# Patient Record
Sex: Female | Born: 1980 | Race: White | Hispanic: No | Marital: Married | Smoking: Never smoker
Health system: Southern US, Community
[De-identification: ages and names within clinical notes are randomized; demographics above are authoritative.]

## PROBLEM LIST (undated history)

## (undated) DIAGNOSIS — M539 Dorsopathy, unspecified: Secondary | ICD-10-CM

## (undated) DIAGNOSIS — L309 Dermatitis, unspecified: Secondary | ICD-10-CM

## (undated) DIAGNOSIS — E039 Hypothyroidism, unspecified: Secondary | ICD-10-CM

## (undated) DIAGNOSIS — I10 Essential (primary) hypertension: Secondary | ICD-10-CM

## (undated) HISTORY — DX: Dorsopathy, unspecified: M53.9

## (undated) HISTORY — DX: Dermatitis, unspecified: L30.9

## (undated) HISTORY — DX: Essential (primary) hypertension: I10

## (undated) HISTORY — DX: Hypothyroidism, unspecified: E03.9

## (undated) HISTORY — PX: HAND SURGERY: SHX662

## (undated) HISTORY — PX: INTRAUTERINE DEVICE INSERTION: SHX323

---

## 2001-09-02 ENCOUNTER — Emergency Department (HOSPITAL_COMMUNITY): Admission: EM | Admit: 2001-09-02 | Discharge: 2001-09-02 | Payer: Self-pay | Admitting: Emergency Medicine

## 2004-02-20 ENCOUNTER — Inpatient Hospital Stay (HOSPITAL_COMMUNITY): Admission: AD | Admit: 2004-02-20 | Discharge: 2004-02-20 | Payer: Self-pay | Admitting: *Deleted

## 2004-04-02 ENCOUNTER — Encounter (INDEPENDENT_AMBULATORY_CARE_PROVIDER_SITE_OTHER): Payer: Self-pay | Admitting: Specialist

## 2004-04-02 ENCOUNTER — Inpatient Hospital Stay (HOSPITAL_COMMUNITY): Admission: AD | Admit: 2004-04-02 | Discharge: 2004-04-04 | Payer: Self-pay | Admitting: Obstetrics & Gynecology

## 2005-03-09 ENCOUNTER — Emergency Department (HOSPITAL_COMMUNITY): Admission: EM | Admit: 2005-03-09 | Discharge: 2005-03-09 | Payer: Self-pay | Admitting: Family Medicine

## 2005-12-05 ENCOUNTER — Encounter: Payer: Self-pay | Admitting: Family Medicine

## 2006-04-05 ENCOUNTER — Encounter: Payer: Self-pay | Admitting: Family Medicine

## 2006-10-16 ENCOUNTER — Ambulatory Visit: Payer: Self-pay | Admitting: Family Medicine

## 2006-10-16 DIAGNOSIS — R599 Enlarged lymph nodes, unspecified: Secondary | ICD-10-CM | POA: Insufficient documentation

## 2006-10-16 DIAGNOSIS — N951 Menopausal and female climacteric states: Secondary | ICD-10-CM | POA: Insufficient documentation

## 2006-10-17 ENCOUNTER — Encounter: Payer: Self-pay | Admitting: Family Medicine

## 2006-10-17 LAB — CONVERTED CEMR LAB
Basophils Absolute: 0 10*3/uL (ref 0.0–0.1)
Basophils Relative: 0 % (ref 0–1)
Eosinophils Absolute: 0.1 10*3/uL (ref 0.0–0.7)
Eosinophils Relative: 1 % (ref 0–5)
Free T4: 1.14 ng/dL (ref 0.89–1.80)
HCT: 43 % (ref 36.0–46.0)
Hemoglobin: 14.1 g/dL (ref 12.0–15.0)
Lymphocytes Relative: 22 % (ref 12–46)
Lymphs Abs: 2.3 10*3/uL (ref 0.7–3.3)
MCHC: 32.8 g/dL (ref 30.0–36.0)
MCV: 91.1 fL (ref 78.0–100.0)
Monocytes Absolute: 0.6 10*3/uL (ref 0.2–0.7)
Monocytes Relative: 6 % (ref 3–11)
Neutro Abs: 7.4 10*3/uL (ref 1.7–7.7)
Neutrophils Relative %: 71 % (ref 43–77)
Platelets: 219 10*3/uL (ref 150–400)
Progesterone: 3.5 ng/mL
RBC: 4.72 M/uL (ref 3.87–5.11)
RDW: 13.9 % (ref 11.5–14.0)
TSH: 0.729 microintl units/mL (ref 0.350–5.50)
WBC: 10.4 10*3/uL (ref 4.0–10.5)

## 2006-10-18 ENCOUNTER — Encounter: Payer: Self-pay | Admitting: Family Medicine

## 2006-10-22 ENCOUNTER — Telehealth: Payer: Self-pay | Admitting: Family Medicine

## 2006-10-22 DIAGNOSIS — F411 Generalized anxiety disorder: Secondary | ICD-10-CM | POA: Insufficient documentation

## 2006-10-31 ENCOUNTER — Telehealth (INDEPENDENT_AMBULATORY_CARE_PROVIDER_SITE_OTHER): Payer: Self-pay | Admitting: *Deleted

## 2006-11-05 ENCOUNTER — Ambulatory Visit (HOSPITAL_COMMUNITY): Payer: Self-pay | Admitting: Psychiatry

## 2006-11-12 ENCOUNTER — Ambulatory Visit (HOSPITAL_COMMUNITY): Payer: Self-pay | Admitting: Psychiatry

## 2007-01-23 ENCOUNTER — Encounter: Admission: RE | Admit: 2007-01-23 | Discharge: 2007-01-23 | Payer: Self-pay | Admitting: Family Medicine

## 2007-03-24 ENCOUNTER — Ambulatory Visit: Payer: Self-pay | Admitting: Family Medicine

## 2007-03-27 ENCOUNTER — Telehealth: Payer: Self-pay | Admitting: Family Medicine

## 2007-05-13 ENCOUNTER — Telehealth: Payer: Self-pay | Admitting: Family Medicine

## 2007-09-04 ENCOUNTER — Ambulatory Visit: Payer: Self-pay | Admitting: Family Medicine

## 2007-09-04 DIAGNOSIS — R21 Rash and other nonspecific skin eruption: Secondary | ICD-10-CM | POA: Insufficient documentation

## 2007-09-04 DIAGNOSIS — B351 Tinea unguium: Secondary | ICD-10-CM | POA: Insufficient documentation

## 2007-09-05 ENCOUNTER — Encounter: Payer: Self-pay | Admitting: Family Medicine

## 2007-09-12 ENCOUNTER — Ambulatory Visit: Payer: Self-pay | Admitting: Family Medicine

## 2007-09-16 ENCOUNTER — Telehealth: Payer: Self-pay | Admitting: Family Medicine

## 2007-10-07 ENCOUNTER — Telehealth: Payer: Self-pay | Admitting: Family Medicine

## 2007-11-26 ENCOUNTER — Ambulatory Visit: Payer: Self-pay | Admitting: Family Medicine

## 2007-11-26 DIAGNOSIS — J3089 Other allergic rhinitis: Secondary | ICD-10-CM

## 2007-11-26 DIAGNOSIS — J309 Allergic rhinitis, unspecified: Secondary | ICD-10-CM | POA: Insufficient documentation

## 2008-05-17 ENCOUNTER — Encounter: Admission: RE | Admit: 2008-05-17 | Discharge: 2008-05-17 | Payer: Self-pay | Admitting: Obstetrics and Gynecology

## 2008-09-22 ENCOUNTER — Ambulatory Visit: Payer: Self-pay | Admitting: Family Medicine

## 2008-11-11 ENCOUNTER — Telehealth: Payer: Self-pay | Admitting: Family Medicine

## 2009-01-19 ENCOUNTER — Ambulatory Visit: Payer: Self-pay | Admitting: Family Medicine

## 2009-01-19 DIAGNOSIS — N6009 Solitary cyst of unspecified breast: Secondary | ICD-10-CM | POA: Insufficient documentation

## 2009-04-13 ENCOUNTER — Ambulatory Visit: Payer: Self-pay | Admitting: Family Medicine

## 2009-04-18 ENCOUNTER — Encounter: Payer: Self-pay | Admitting: Family Medicine

## 2009-04-19 LAB — CONVERTED CEMR LAB
ALT: 11 units/L (ref 0–35)
AST: 15 units/L (ref 0–37)
Albumin: 4.6 g/dL (ref 3.5–5.2)
Alkaline Phosphatase: 65 units/L (ref 39–117)
BUN: 16 mg/dL (ref 6–23)
CO2: 23 meq/L (ref 19–32)
Calcium: 8.7 mg/dL (ref 8.4–10.5)
Chloride: 106 meq/L (ref 96–112)
Cholesterol: 139 mg/dL (ref 0–200)
Creatinine, Ser: 0.77 mg/dL (ref 0.40–1.20)
Glucose, Bld: 74 mg/dL (ref 70–99)
HDL: 48 mg/dL (ref >39)
LDL Cholesterol: 79 mg/dL (ref 0–99)
Potassium: 4 meq/L (ref 3.5–5.3)
Sodium: 139 meq/L (ref 135–145)
TSH: 2.64 microintl units/mL (ref 0.350–4.500)
Total Bilirubin: 0.5 mg/dL (ref 0.3–1.2)
Total CHOL/HDL Ratio: 2.9
Total Protein: 7.4 g/dL (ref 6.0–8.3)
Triglycerides: 60 mg/dL (ref <150)
VLDL: 12 mg/dL (ref 0–40)

## 2009-09-07 ENCOUNTER — Encounter: Payer: Self-pay | Admitting: Family Medicine

## 2009-09-07 DIAGNOSIS — L259 Unspecified contact dermatitis, unspecified cause: Secondary | ICD-10-CM | POA: Insufficient documentation

## 2009-09-30 ENCOUNTER — Encounter: Admission: RE | Admit: 2009-09-30 | Discharge: 2009-09-30 | Payer: Self-pay | Admitting: Obstetrics and Gynecology

## 2009-10-11 ENCOUNTER — Encounter: Admission: RE | Admit: 2009-10-11 | Discharge: 2009-10-11 | Payer: Self-pay | Admitting: Obstetrics and Gynecology

## 2009-12-03 ENCOUNTER — Encounter: Payer: Self-pay | Admitting: Family Medicine

## 2009-12-03 DIAGNOSIS — H6500 Acute serous otitis media, unspecified ear: Secondary | ICD-10-CM | POA: Insufficient documentation

## 2009-12-27 ENCOUNTER — Ambulatory Visit: Admit: 2009-12-27 | Discharge: 2009-12-27 | Payer: Self-pay | Admitting: Emergency Medicine

## 2010-01-31 NOTE — Assessment & Plan Note (Signed)
Summary: URI   Vital Signs:  Patient Profile:   30 Years Old Female Height:     64.75 inches Weight:      157 pounds O2 Sat:      99 % Temp:     98.2 degrees F oral Pulse rate:   90 / minute BP sitting:   125 / 72  (left arm) Cuff size:   regular  Vitals Entered By: Harlene Salts (September 12, 2007 10:30 AM)                 PCP:  [name] DO  Chief Complaint:  throat sore, ear pressure, headache, nose stopped up, and fatigue.  History of Present Illness: Yesterday AM with mild ST, sinus congestion, bilateral ear pressure. No fever.  Some nausea.  No diarrhea or vomiting. Her back hurts. Feel very fatigued.  HA.  Took sinus medication.  - no help. No alleviateing sxs.  Didn't sleep well at all.      Current Allergies: No known allergies       Physical Exam  General:     Well-developed,well-nourished,in no acute distress; alert,appropriate and cooperative throughout examination Head:     Normocephalic and atraumatic without obvious abnormalities. No apparent alopecia or balding. Eyes:     No corneal or conjunctival inflammation noted. EOMI. Perrla. Ears:     External ear exam shows no significant lesions or deformities.  Otoscopic examination reveals clear canals, tympanic membranes are intact bilaterally without bulging, retraction, inflammation or discharge. Hearing is grossly normal bilaterally. Nose:     External nasal examination shows no deformity or inflammation. Nasal mucosa are pink and moist without lesions or exudates. Mouth:     Oral mucosa and oropharynx without lesions or exudates.  Teeth in good repair. Neck:     No deformities, masses, or tenderness noted. Lungs:     Normal respiratory effort, chest expands symmetrically. Lungs are clear to auscultation, no crackles or wheezes. Heart:     Normal rate and regular rhythm. S1 and S2 normal without gallop, murmur, click, rub or other extra sounds. Skin:     no rashes.   Cervical Nodes:  No lymphadenopathy noted Psych:     Cognition and judgment appear intact. Alert and cooperative with normal attention span and concentration. No apparent delusions, illusions, hallucinations    Impression & Recommendations:  Problem # 1:  URI (ICD-465.9)  Her updated medication list for this problem includes:    Tessalon Perles 100 Mg Caps (Benzonatate) .Marland Kitchen... 1 tab by mouth three times a day as needed cough    Hycodan 5-1.5 Mg/50ml Syrp (Hydrocodone-homatropine) .Marland Kitchen... 1 tsp by mouth at bedtime as needed cough    Fexofenadine Hcl 180 Mg Tabs (Fexofenadine hcl) .Marland Kitchen... 1 tab by mouth daily as needed for allergies Instructed on symptomatic treatment. Call if symptoms persist or worsen.   Complete Medication List: 1)  Mirena 20 Mcg/24hr Iud (Levonorgestrel) .... Use as directed 2)  Klonopin 0.5 Mg Tabs (Clonazepam) .Marland Kitchen.. 1 tab by mouth bid as needed anxiety 3)  Tessalon Perles 100 Mg Caps (Benzonatate) .Marland Kitchen.. 1 tab by mouth three times a day as needed cough 4)  Hycodan 5-1.5 Mg/16ml Syrp (Hydrocodone-homatropine) .Marland Kitchen.. 1 tsp by mouth at bedtime as needed cough 5)  Fexofenadine Hcl 180 Mg Tabs (Fexofenadine hcl) .Marland Kitchen.. 1 tab by mouth daily as needed for allergies 6)  Penlac 8 % Soln (Ciclopirox) .... Apply to nail at bedtime. then removed once a week with alcohol. 7)  Fluoxetine  Hcl 10 Mg Tabs (Fluoxetine hcl) .... Take 1 tablet by mouth once a day    ]

## 2010-01-31 NOTE — Progress Notes (Signed)
Summary: call re: labs  Phone Note Outgoing Call Call back at Home Phone [phone]   Call placed by: [name] DO,  October 22, 2006 9:26 AM Summary of Call: called pt and LMOM re: normal labs.  Nothing to explain hot flashes and sweating.  Interested to hear if lowered dose of SSRI has made a difference.  If still having problems, will make sure HIV screen and PPD screen are UTD.   Initial call taken by: [name] DO,  October 22, 2006 9:27 AM  Follow-up for Phone Call        Isabelle Course, can you try her back today? Follow-up by: [name] DO,  October 23, 2006 1:19 PM  Additional Follow-up for Phone Call Additional follow up Details #1::        Spoke to REbeccca who notices that episodes are stress-related and feel anxious and accepts the marrital discord at home.  Agrees to trying counseling, stop Lexapro and will use Klonopin as needed. Additional Follow-up by: [name] DO,  October 24, 2006 10:18 AM  New Problems: ANXIETY STATE NOS (ICD-300.00)   New Problems: ANXIETY STATE NOS (ICD-300.00) New/Updated Medications: KLONOPIN 0.5 MG  TABS (CLONAZEPAM) 1 tab by mouth bid as needed anxiety patient states that lowered dose of SSRI has not made a difference.  she is not sure as to what you want her to do from here and wants to know if it could be her nerves.  patient said to call her at work [phone] her cell phone does not work.  Lucille Passy, October 22, 2006 3:20PM  Prescriptions: KLONOPIN 0.5 MG  TABS (CLONAZEPAM) 1 tab by mouth bid as needed anxiety  #40 x 0   Entered and Authorized by:   [name] DO   Signed by:   [name] DO on 10/24/2006   Method used:   Printed then faxed to ...       [facility]  [address] [place], [place]  [address]       Ph: [phone]       Fax: [phone]   RxID:   [id number]     Appended Document: call re: labs RX SENT TO [facility] WHICH IS WRONG PHARMACY AND TODAY CALLED TO CORRECT PHARMACY [facility]  [email or web address]

## 2010-01-31 NOTE — Progress Notes (Signed)
  Phone Note Call from Patient Call back at Work Phone [phone]   Caller: Patient Call For: [facility] Summary of Call: Pt calls and needs to know if she is still contagious- has to let her manager know downstairs Initial call taken by: Kathlene November,  September 16, 2007 10:09 AM  Follow-up for Phone Call        With all cold and viruses you are contagious.  Yes.  Follow-up by: Nani Gasser MD,  September 16, 2007 10:53 AM  Additional Follow-up for Phone Call Additional follow up Details #1::        Pt notified of MD instructions. KJ LPN Additional Follow-up by: Kathlene November,  September 16, 2007 11:11 AM

## 2010-01-31 NOTE — Progress Notes (Signed)
Summary: WANT PROZAC DOSE INCREASED  Phone Note Call from Patient Call back at Work Phone [phone]   Caller: Patient Call For: [name] DO Summary of Call: Patient called to say that prozac has helped but she thinks she need it to be increased some. Patient uses  [facility]. Initial call taken by: Harlene Salts,  October 07, 2007 9:03 AM  Follow-up for Phone Call        Pt to increase to Fluoxetine 20 mg once daily (2 - 10 mg tabs).  Have her make OV to see me back in 2 wks. Follow-up by: [name] DO,  October 07, 2007 9:22 AM  Additional Follow-up for Phone Call Additional follow up Details #1::        patient informed. Additional Follow-up by: Harlene Salts,  October 07, 2007 10:41 AM    New/Updated Medications: FLUOXETINE HCL 10 MG TABS (FLUOXETINE HCL) Take 2  tablet by mouth once a day   Prescriptions: FLUOXETINE HCL 10 MG TABS (FLUOXETINE HCL) Take 2  tablet by mouth once a day  #60 x 0   Entered by:   Harlene Salts   Authorized by:   [name] DO   Signed by:   Harlene Salts on 10/07/2007   Method used:   Electronically to        [facility]* (retail)       [address] [place], [place]  [address]       Ph: [phone]       Fax: [phone]   RxID:   [id number]

## 2010-01-31 NOTE — Assessment & Plan Note (Signed)
Summary: CPE w/o pap    Vital Signs:  Patient profile:   30 year old female Height:      64.75 inches Weight:      155 pounds BMI:     26.09 O2 Sat:      99 % on Room air Pulse rate:   86 / minute BP sitting:   145 / 85  (left arm) Cuff size:   regular  Vitals Entered By: Payton Spark CMA (April 13, 2009 4:09 PM)  O2 Flow:  Room air CC: CPE w/out pap   Primary Care Provider:  [name] DO  CC:  CPE w/out pap.  History of Present Illness: 30 yo WF presents for CPE w/o pap smear.  Seeing gyn for pap smears.  She is under a lot of stress going thru a divorce.  She just moved back into her old house and is dealing custoday and financial problems.    She is overall happy that she decided to separate.  Her BP is high today.  She is due for fasting labs.  She thinks that she had her tetanus < 10 yrs ago.  Her mom has Graves Dz.  She wants to have her TSH checked.    Denies fam hx of colon cancer, breast cancer or premature heart dz.      Current Medications (verified): 1)  Mirena 20 Mcg/24hr  Iud (Levonorgestrel) .... Use As Directed 2)  Klonopin 0.5 Mg  Tabs (Clonazepam) .Marland Kitchen.. 1 Tab By Mouth Bid As Needed Anxiety 3)  Claritin-D 24 Hour 10-240 Mg Xr24h-Tab (Loratadine-Pseudoephedrine) .Marland Kitchen.. 1 Tab By Mouth Daily 4)  Penlac 8 % Soln (Ciclopirox) .... Apply To Nail At Bedtime. Then Removed Once A Week With Alcohol. 5)  Zoloft 50 Mg Tabs (Sertraline Hcl) .Marland Kitchen.. 1 Tab By Mouth Daily  Allergies (verified): No Known Drug Allergies  Past History:  Past Medical History: Reviewed history from 10/16/2006 and no changes required. G1P1  Family History: mother Graves Dz  Social History: Reviewed history from 10/16/2006 and no changes required. 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 Occas ETOH, social smoker < 2/ wk No formal exercise.  Review of Systems       The patient complains of weight gain.  The patient denies anorexia, fever, weight  loss, vision loss, decreased hearing, hoarseness, chest pain, syncope, dyspnea on exertion, peripheral edema, prolonged cough, headaches, hemoptysis, abdominal pain, melena, hematochezia, severe indigestion/heartburn, hematuria, incontinence, genital sores, muscle weakness, suspicious skin lesions, transient blindness, difficulty walking, depression, unusual weight change, abnormal bleeding, enlarged lymph nodes, angioedema, breast masses, and testicular masses.    Physical Exam  General:  alert, well-developed, well-nourished, well-hydrated, and overweight-appearing.   Head:  normocephalic and atraumatic.   Eyes:  pupils equal, pupils round, and pupils reactive to light.  wears glasses Ears:  EACs patent; TMs translucent and gray with good cone of light and bony landmarks.  Nose:  no nasal discharge.   Mouth:  good dentition and pharynx pink and moist.   Neck:  no masses.   Lungs:  Normal respiratory effort, chest expands symmetrically. Lungs are clear to auscultation, no crackles or wheezes. Heart:  Normal rate and regular rhythm. S1 and S2 normal without gallop, murmur, click, rub or other extra sounds. Abdomen:  Bowel sounds positive,abdomen soft and non-tender without masses, organomegaly or hernias noted. Pulses:  2+ radial and pedal pulses Extremities:  no E/C/C Skin:  color normal.   Cervical Nodes:  No lymphadenopathy  noted Psych:  good eye contact, not anxious appearing, and not depressed appearing.     Impression & Recommendations:  Problem # 1:  HEALTH SCREENING (ICD-V70.0) Keeping healthy checklist for women reviewed. BP high today, under more stress.  REpeat at nurse visit in 1 wk. BMI 26 c/w overwt status. Pap smear per gyn. Update fasting labs, include TSH. Work on [employer], regular exercise, MVI and Vitamin D daily. RTC in 2-3 mos for f/u mood.  Complete Medication List: 1)  Mirena 20 Mcg/24hr Iud (Levonorgestrel) .... Use as directed 2)  Klonopin 0.5 Mg Tabs  (Clonazepam) .Marland Kitchen.. 1 tab by mouth bid as needed anxiety 3)  Claritin-d 24 Hour 10-240 Mg Xr24h-tab (Loratadine-pseudoephedrine) .Marland Kitchen.. 1 tab by mouth daily 4)  Penlac 8 % Soln (Ciclopirox) .... Apply to nail at bedtime. then removed once a week with alcohol. 5)  Zoloft 50 Mg Tabs (Sertraline hcl) .Marland Kitchen.. 1 tab by mouth daily  Other Orders: T-TSH ([id number]) T-Comprehensive Metabolic Panel [id number]) T-Lipid Profile [id number])  Patient Instructions: 1)  Stay on Zoloft + Klonopin for mood during stressful life events. 2)  F/U for mood in 2-3 mos. 3)  Update fasting labs. 4)  Will call you w/ results.

## 2010-01-31 NOTE — Assessment & Plan Note (Signed)
Summary: NOV hot flashes/ear pain   Vital Signs:  Patient Profile:   30 Years Old Female Height:     64.75 inches Weight:      147 pounds BMI:     24.74 Temp:     98.2 degrees F oral Pulse rate:   88 / minute BP sitting:   122 / 78  (left arm) Cuff size:   regular  Vitals Entered By: Harlene Salts (October 16, 2006 2:26 PM)                 Visit Type:  new PCP:  [name] DO  Chief Complaint:  NOV, CHECK KNOT AT EAR CAUSING PAIN, and WANT THROID CHECKED.  History of Present Illness: 30 yo G1P1 presents as a new office visit for problems with hot flashes, sweating, and moodiness.  This began before she had her first child 2 and a half years ago and seems to be getting a little worse.  She had her thryoid checked about 6 mso ago and told it was 'borderline'.  She is on Lexapro for postpartum moodiness which was also related to her husband's lack of help towards her.  She happily enjoys being a mom to her daughter Alicia Sanders'.  Dr Earlene Plater (OB-GYN) placed a Mirena IUD after the birth of her daughter.  The hot flashes and sweating occurs day and night.  Even with cutting back on caffeine or being in cold weather, this occurs.  It started before she went on Lexapro.  She denies having heart palpitations, diarrhea, constipation or problems iwth blood pressure.  Current Allergies: No known allergies   Past Medical History:    G1P1   Social History:    Works at [employer], Medical laboratory scientific officer.    Married with a young daughter , Alicia Sanders.    Occas ETOH, social smoker < 2/ wk    No formal exercise.   Risk Factors:  Caffeine use:  2 drinks per day  Family History Risk Factors:    Family History of MI in females < 55 years old:  no    Family History of MI in males < 5 years old:  no   Review of Systems       no fevers/+sweats/no weakness, unexplained wt loss/gain, no change in vision, no difficulty hearing, ringing in ears, no hay fever/allergies, no CP/discomfort, no palpitations,  no breast lump/nipple discharge, no cough/wheeze, no blood in stool, no N/V/D, no nocturia, no leaking urine, no unusual vag bleeding, no vaginal/penile discharge, no muscle/joint pain, no rash, no new/changing mole, no HA, no memory loss, no anxiety, no sleep problem, no depression, no unexplained lumps, no easy bruising/bleeding, no concern with sexual function, +painful bump in front of L ear x 2 days    Physical Exam  General:     alert, well-developed, well-nourished, and well-hydrated.   Head:     normocephalic and atraumatic.   Eyes:     pupils equal, pupils round, and pupils reactive to light.  conjunctiva clear Ears:     EACs patent; TMs translucent and gray with good cone of light and bony landmarks. slightly enlarged L preauricular node, tender to palpation  Nose:     no external deformity and no nasal discharge.   Mouth:     good dentition and pharynx pink and moist.  slightly tender but no clicking or dislocation at the TMJ, 2+ bilat tonsilar enlargement, cryptic with mucocele on the R one. Neck:     supple and no  masses.   Lungs:     normal respiratory effort, no accessory muscle use, normal breath sounds, and no wheezes.   Heart:     normal rate, regular rhythm, no murmur, and no rub.   Abdomen:     Bowel sounds positive,abdomen soft and non-tender without masses, organomegaly or hernias noted. Msk:     no joint effusions Pulses:     2+ radial pulses Extremities:     no E/C/C Neurologic:     gait normal.   Skin:     slightly flushed cheeks; skin warm and dry Cervical Nodes:     No lymphadenopathy noted Psych:     good eye contact, not anxious appearing, and not depressed appearing.      Impression & Recommendations:  Problem # 1:  HOT FLASHES (ICD-627.2) Hot Flashes, facial flushing and nightsweats > 2 yrs.  DDX includes SSRI side effect, thryoid d/o, fluctuating estrogen levels, anxiety, adrenal tumor.  Begin with lab w/u.  Cut back on Lexapro in order  to wean off.  See back in 3 wks to review and consider thryoid and renal u/s.   Orders: T-CBC w/Diff [id number] T-TSH [id number]) T-T4, Free [id number]) T-Progesterone ([id number]) T-Estradiol [id number])   Problem # 2:  SYMPTOM, ENLARGEMENT, LYMPH NODES (ICD-785.6) Slightly enlarged R preauricular lymph node, tender x 1-2 days.  No concurrent viral illness or allergies.  Also in this location which can cause referred ear pain is the TMJ.  Consider that this area is tender from TMJ pain syndrome, bruxism, or dental problem.  She can use Motrin and cool compresses for relief.  Call if anything changes.  Complete Medication List: 1)  Lexapro 5 Mg Tabs (Escitalopram oxalate) .Marland Kitchen.. 1 tab by mouth daily 2)  Mirena 20 Mcg/24hr Iud (Levonorgestrel) .... Use as directed   Patient Instructions: 1)  For preauricular node and ear pain: use cool compresses and ibuprofen 600 mg 3 x a day as needed.  Try an OTC bite guard. 2)  Have labs drawn.  I will call you w/ results on Friday. 3)  Start lower dose of Lexapro. 4)  Return for f/u in 3 wks.    Prescriptions: LEXAPRO 5 MG  TABS (ESCITALOPRAM OXALATE) 1 tab by mouth daily  #30 x 1   Entered and Authorized by:   [name] DO   Signed by:   [name] DO on 10/16/2006   Method used:   Print then Give to Patient   RxID:   [id number]  ]

## 2010-01-31 NOTE — Progress Notes (Signed)
----   Converted from flag ---- ---- 11/11/2008 10:22 AM, Marliss Coots wrote: Pt uses [facility] in [place]. Pt has app on 11-24-08. Thanks.Marland Kitchen ------------------------------  Prescriptions: KLONOPIN 0.5 MG  TABS (CLONAZEPAM) 1 tab by mouth bid as needed anxiety  #40 x 0   Entered and Authorized by:   Nani Gasser MD   Signed by:   Nani Gasser MD on 11/11/2008   Method used:   Printed then faxed to ...         RxID:   [id number]  OK to refill for one month

## 2010-01-31 NOTE — Progress Notes (Signed)
Summary: Feels worse  Phone Note Call from Patient Call back at Home Phone [phone]   Caller: Patient Call For: [place] Summary of Call: Pt calls and states that she is no better- actually worse. Wants top know if you would call in antibiotic and some cough syrup to [facility] in [place].States blowing green-yellow from nose and coughing up green. Initial call taken by: Kathlene November,  September 16, 2007 8:04 AM  Follow-up for Phone Call        Will treat since getting worse.  Follow-up by: Nani Gasser MD,  September 16, 2007 8:07 AM    New/Updated Medications: AMOXICILLIN 500 MG CAPS (AMOXICILLIN) Take 1 tablet by mouth three times a day for 10 days HYDROCODONE-HOMATROPINE 5-1.5 MG/5ML SYRP (HYDROCODONE-HOMATROPINE) 5ml by mouth at bedtime as needed cough   Prescriptions: HYDROCODONE-HOMATROPINE 5-1.5 MG/5ML SYRP (HYDROCODONE-HOMATROPINE) 5ml by mouth at bedtime as needed cough  #143ml x 0   Entered and Authorized by:   Nani Gasser MD   Signed by:   Nani Gasser MD on 09/16/2007   Method used:   Printed then faxed to ...       [facility]* (retail)       [address] [place], [place]  [address]       Ph: [phone]       Fax: [phone]   RxID:   [id number] AMOXICILLIN 500 MG CAPS (AMOXICILLIN) Take 1 tablet by mouth three times a day for 10 days  #30 x 0   Entered and Authorized by:   Nani Gasser MD   Signed by:   Nani Gasser MD on 09/16/2007   Method used:   Electronically to        [facility]* (retail)       [address] [place], [place]  [address]       Ph: [phone]       Fax: [phone]   RxID:   [id number]
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
## 2010-01-31 NOTE — Miscellaneous (Signed)
Summary: Immunizations  Immunizations   Imported By: Harlene Salts 11/01/2006 11:13:29  _____________________________________________________________________  External Attachment:    Type:   Image     Comment:   External Document

## 2010-01-31 NOTE — Miscellaneous (Signed)
Summary: Immunizations  Immunizations   Imported By: Harlene Salts 11/01/2006 11:12:57  _____________________________________________________________________  External Attachment:    Type:   Image     Comment:   External Document

## 2010-01-31 NOTE — Progress Notes (Signed)
Summary: WANT TO CHANGE ALLERGY MED  Phone Note Call from Patient Call back at Work Phone [phone]   Caller: Patient Call For: Alicia Bars DO Summary of Call: PATIENT HAS BEEN USING OTC CLARITAN AND IT'S NOT WORKING AND WANT TO KNOW IF MD WOULD PRESCRIBE ALLEGRA. PATIENT USES [facility]. Initial call taken by: Harlene Salts,  May 13, 2007 8:16 AM  Follow-up for Phone Call        sure. Follow-up by: Alicia Bars DO,  May 13, 2007 8:26 AM  Additional Follow-up for Phone Call Additional follow up Details #1::        PATIENT INFORMED. Additional Follow-up by: Harlene Salts,  May 13, 2007 8:38 AM    New/Updated Medications: FEXOFENADINE HCL 180 MG  TABS (FEXOFENADINE HCL) 1 tab by mouth daily as needed for allergies   Prescriptions: FEXOFENADINE HCL 180 MG  TABS (FEXOFENADINE HCL) 1 tab by mouth daily as needed for allergies  #30 x 2   Entered and Authorized by:   Alicia Bars DO   Signed by:   Alicia Bars DO on 05/13/2007   Method used:   Electronically sent to ...       [facility]       [address] [place], [place]  [address]       Ph: [phone]       Fax: [phone]   RxID:   [id number]

## 2010-01-31 NOTE — Assessment & Plan Note (Signed)
Summary: Rash  Subjective:  Patient complains of occasional appearance of discrete macular erythematous lesions on arms or upper chest that last a while then resolve.  Lesions do not itch.  No response to 1% HC cream.  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  She now has a recurrent lesion on right upper arm near axilla.  Objective:  Appearance:  Patient appears healthy, stated age, and in no acute distress  Skin:  right upper inferior arm near axilla:  1.5cm dia oval erythematous macular lesion with well demarcated edge.  No scaling or tenderness.  No swelling.  Assessment:  DERMATITIS (ICD-692.9) ? etiology.  Lesion appears non-specific  Plan:  ELOCON 0.1 % CREA (MOMETASONE FUROATE) Apply thin layer to affected area two times a day for 3 to 5 days. Follow-up with dermatologist if lesions begin appearing more frequently or if no response to Elocon

## 2010-01-31 NOTE — Assessment & Plan Note (Signed)
Summary: Allergic rhinitis/sinusitis   Vital Signs:  Patient profile:   30 year old female Height:      64.75 inches Weight:      161 pounds BMI:     27.10 Temp:     97.7 degrees F oral Pulse rate:   82 / minute BP sitting:   116 / 74  (left arm) Cuff size:   regular  Vitals Entered By: Kathlene November (September 22, 2008 3:41 PM) CC: drainage, pressure in ears, hot, fatigue. Symptoms since last Thursday   Primary Care Provider:  [name] DO  CC:  drainage, pressure in ears, hot, and fatigue. Symptoms since last Thursday.  History of Present Illness: drainage, pressure in ears, hot, fatigue. Symptoms since last Thursday.  Yellow sinus drainage. Bilat ear pain so severe it was waking her up.  Then hearing a whoosing.  Throat is irriated and sore for the last couple of days. Taking OTC severe head congestion med.  Itchy nose.  Taking allegra. No fever.  Does feel a little better today.  Allergies: No Known Drug Allergies  Physical Exam  General:  Well-developed,well-nourished,in no acute distress; alert,appropriate and cooperative throughout examination Head:  Normocephalic and atraumatic without obvious abnormalities. No apparent alopecia or balding. Eyes:  No corneal or conjunctival inflammation noted. EOMI. Perrla. Ears:  External ear exam shows no significant lesions or deformities.  Otoscopic examination reveals clear canals, tympanic membranes are intact bilaterally without bulging, retraction, inflammation or discharge. Hearing is grossly normal bilaterally. Nose:  External nasal examination shows no deformity or inflammation.  Mouth:  Oral mucosa and oropharynx without lesions or exudates.  Teeth in good repair. Neck:  No deformities, masses, or tenderness noted. Lungs:  Normal respiratory effort, chest expands symmetrically. Lungs are clear to auscultation, no crackles or wheezes. Heart:  Normal rate and regular rhythm. S1 and S2 normal without gallop, murmur, click, rub  or other extra sounds. Skin:  no rashes.   Cervical Nodes:  No lymphadenopathy noted Psych:  Cognition and judgment appear intact. Alert and cooperative with normal attention span and concentration. No apparent delusions, illusions, hallucinations   Impression & Recommendations:  Problem # 1:  ALLERGIC RHINITIS (ICD-477.9) Will add a nasal steroid and recommend a decongestant like sudafed. If not better in a couple of days call back and will treat for sinusitis with an ABX. Sample of nasal steroi given. Call if fever or getting worse.  Her updated medication list for this problem includes:    Fexofenadine Hcl 180 Mg Tabs (Fexofenadine hcl) .Marland Kitchen... 1 tab by mouth daily as needed for allergies  Complete Medication List: 1)  Mirena 20 Mcg/24hr Iud (Levonorgestrel) .... Use as directed 2)  Klonopin 0.5 Mg Tabs (Clonazepam) .Marland Kitchen.. 1 tab by mouth bid as needed anxiety 3)  Fexofenadine Hcl 180 Mg Tabs (Fexofenadine hcl) .Marland Kitchen.. 1 tab by mouth daily as needed for allergies 4)  Penlac 8 % Soln (Ciclopirox) .... Apply to nail at bedtime. then removed once a week with alcohol. 5)  Fluoxetine Hcl 20 Mg Caps (Fluoxetine hcl) .Marland Kitchen.. 1 tab by mouth qam

## 2010-01-31 NOTE — Assessment & Plan Note (Signed)
Summary: f/u anxiety   Vital Signs:  Patient Profile:   30 Years Old Female Height:     64.75 inches Weight:      154 pounds Pulse rate:   83 / minute BP sitting:   112 / 73  (left arm) Cuff size:   regular  Vitals Entered By: Harlene Salts (November 26, 2007 8:17 AM)                 PCP:  [name] DO  Chief Complaint:  follow-up anxiety.  History of Present Illness: 30 yo WF seen back for :  Anxiety.  Doing better with Fluoxetine but was taking 10 mg bid instead of 20 mg once a day.  She has been working full time and went back to school.  Having some test anxiety.  No longer using Klonopin.  Will be finishing school in December.  Husband has been more helpful.  She still has some yellow discoloration of her toenails.  Used Penlac for 2-3 months.  White spots disappeared.  Keeping polish off.  Nails breaking fairly easily.  No nail pain.  No thickening.      Current Allergies: No known allergies    Social History:    Reviewed history from 10/16/2006 and no changes required:       Works at [employer], Medical laboratory scientific officer.       Married with a young daughter , [name].       Occas ETOH, social smoker < 2/ wk       No formal exercise.    Review of Systems      See HPI   Physical Exam  General:     alert, well-developed, well-nourished, and well-hydrated.   Head:     normocephalic and atraumatic.   Eyes:     wears glasses, conjunctiva clear Nose:     mild nasal congestion, clear rhinorrhea Mouth:     throat injected with clear post nasal drip and 1+ tonsilar hypertrophy Neck:     supple and no masses.   Lungs:     Normal respiratory effort, chest expands symmetrically. Lungs are clear to auscultation, no crackles or wheezes. Heart:     Normal rate and regular rhythm. S1 and S2 normal without gallop, murmur, click, rub or other extra sounds. Skin:     color normal.   Cervical Nodes:     No lymphadenopathy noted Psych:     good eye contact, not  anxious appearing, and not depressed appearing.      Impression & Recommendations:  Problem # 1:  ANXIETY STATE NOS (ICD-300.00) Assessment: Improved Make sure she is taking Fluoxetine once a day and not twice a day as she was.  New Rx sent to pharmacy.  Happy about husband's support and will be happy to finish classes in December.  Will see if we can d/c meds at 6 mos f/u. Her updated medication list for this problem includes:    Klonopin 0.5 Mg Tabs (Clonazepam) .Marland Kitchen... 1 tab by mouth bid as needed anxiety    Fluoxetine Hcl 20 Mg Caps (Fluoxetine hcl) .Marland Kitchen... 1 tab by mouth qam   Problem # 2:  DERMATOPHYTOSIS OF NAIL (ICD-110.1) Improved with Penlac.  No thickening to suggest fungus.  No further treatment needed. Her updated medication list for this problem includes:    Penlac 8 % Soln (Ciclopirox) .Marland Kitchen... Apply to nail at bedtime. then removed once a week with alcohol.   Problem # 3:  ALLERGIC RHINITIS (ICD-477.9)  RF'd Allegra.  Signs of post nasal drip on exam. Her updated medication list for this problem includes:    Fexofenadine Hcl 180 Mg Tabs (Fexofenadine hcl) .Marland Kitchen... 1 tab by mouth daily as needed for allergies   Complete Medication List: 1)  Mirena 20 Mcg/24hr Iud (Levonorgestrel) .... Use as directed 2)  Klonopin 0.5 Mg Tabs (Clonazepam) .Marland Kitchen.. 1 tab by mouth bid as needed anxiety 3)  Fexofenadine Hcl 180 Mg Tabs (Fexofenadine hcl) .Marland Kitchen.. 1 tab by mouth daily as needed for allergies 4)  Penlac 8 % Soln (Ciclopirox) .... Apply to nail at bedtime. then removed once a week with alcohol. 5)  Fluoxetine Hcl 20 Mg Caps (Fluoxetine hcl) .Marland Kitchen.. 1 tab by mouth qam   Patient Instructions: 1)  For allergies, stay on Allegra daily thru the Fall. 2)  For anxiety, stay on Fluoxetine 20 mg once a day.   3)  Good luck completing your schooling! 4)  Return in 6 months for f/u.   Prescriptions: FEXOFENADINE HCL 180 MG  TABS (FEXOFENADINE HCL) 1 tab by mouth daily as needed for allergies  #30 x  2   Entered and Authorized by:   [name] DO   Signed by:   [name] DO on 11/26/2007   Method used:   Electronically to        [facility]* (retail)       [address] [place], [place]  [address]       Ph: [phone]       Fax: [phone]   RxID:   [id number] FLUOXETINE HCL 20 MG CAPS (FLUOXETINE HCL) 1 tab by mouth qAM  #30 x 4   Entered and Authorized by:   [name] DO   Signed by:   [name] DO on 11/26/2007   Method used:   Electronically to        [facility]* (retail)       [address] [place], [place]  [address]       Ph: [phone]       Fax: [phone]   RxID:   [id number]  ]  Appended Document: f/u anxiety    Flu Vaccine Consent Questions     Do you have a history of severe allergic reactions to this vaccine? no    Any prior history of allergic reactions to egg and/or gelatin? no    Do you have a sensitivity to the preservative Thimersol? no    Do you have a past history of Guillan-Barre Syndrome? no    Do you currently have an acute febrile illness? no    Have you ever had a severe reaction to latex? no    Vaccine information given and explained to patient? yes    Are you currently pregnant? no   Do you have Asthma? no   Lot Number: [id number]   Exp Date:06/30/2008   Site Given  L Deltoid     Current Allergies: No known allergies         Complete Medication List: 1)  Mirena 20 Mcg/24hr Iud (Levonorgestrel) .... Use as directed 2)  Klonopin 0.5 Mg Tabs (Clonazepam) .Marland Kitchen.. 1 tab by mouth bid as needed anxiety 3)  Fexofenadine Hcl 180 Mg Tabs (Fexofenadine hcl) .Marland Kitchen.. 1 tab by mouth daily as needed for allergies 4)  Penlac 8 % Soln (Ciclopirox) .... Apply to nail at bedtime. then removed once a week with  alcohol. 5)  Fluoxetine Hcl 20 Mg Caps (Fluoxetine hcl) .Marland Kitchen.. 1 tab by mouth qam    ]

## 2010-01-31 NOTE — Letter (Signed)
Summary: Out of Work  [name]  [address]   [place], [place] [address]   Phone: [phone]  Fax: [phone]    September 12, 2007   Employee:  Alicia Sanders    To Whom It May Concern:   For Medical reasons, please excuse the above named employee from work for the following dates:  Start:   09-12-07  End:   09-14-07  If you need additional information, please feel free to contact our office.         Sincerely,    Nani Gasser MD

## 2010-01-31 NOTE — Progress Notes (Signed)
Summary: PATIENT NOT ANY BETTER  Phone Note Call from Patient Call back at Work Phone [phone]   Caller: Patient Call For: [name] DO Summary of Call: PATIENT CALLED TO SAY THAT HER COUGH IS WORSE AND THE COUGH MED THAT WAS GIVE ISN'T WORKING ALSO NOW SHE IS BLOWING YELLOW AND GREEN MUCOUS FROM HER NOSE. Initial call taken by: Harlene Salts,  March 27, 2007 10:08 AM  Follow-up for Phone Call        Will start her on antibiotics and a hydrocodone cough syrup at night.  Let me know if not improved in 1 wk. Follow-up by: [name] DO,  March 27, 2007 10:17 AM  Additional Follow-up for Phone Call Additional follow up Details #1::        PATIENT INFORMED AND USES [facility]. Additional Follow-up by: Harlene Salts,  March 27, 2007 10:37 AM    New/Updated Medications: ZITHROMAX Z-PAK 250 MG  TABS (AZITHROMYCIN) 2 tabs by mouth x 1 day then 1 tab by mouth daily x 4 days HYCODAN 5-1.5 MG/5ML  SYRP (HYDROCODONE-HOMATROPINE) 1 tsp by mouth at bedtime as needed cough   Prescriptions: HYCODAN 5-1.5 MG/5ML  SYRP (HYDROCODONE-HOMATROPINE) 1 tsp by mouth at bedtime as needed cough  #100 cc x 0   Entered and Authorized by:   [name] DO   Signed by:   Harlene Salts on 03/27/2007   Method used:   Print then Give to Patient   RxID:   [id number] ZITHROMAX Z-PAK 250 MG  TABS (AZITHROMYCIN) 2 tabs by mouth x 1 day then 1 tab by mouth daily x 4 days  #1 pack x 0   Entered and Authorized by:   [name] DO   Signed by:   Harlene Salts on 03/27/2007   Method used:   Print then Give to Patient   RxID:   [id number]

## 2010-01-31 NOTE — Letter (Signed)
Summary: Out of Work  [facility]  [address], [address]   [place], [place] [address]   Phone: [phone]  Fax: [phone]    March 24, 2007   Employee:  Alicia Sanders    To Whom It May Concern:   For Medical reasons, please excuse the above named employee from work for the following dates:  Start:   March 23-24th  End:   March 25th  If you need additional information, please feel free to contact our office.         Sincerely,    [name] DO

## 2010-01-31 NOTE — Assessment & Plan Note (Signed)
Summary: URI symptoms  Subjective:  Patient complains of onset of increased sinus congestion yesterday, and last night her right ear hurt.  No sore throat but has some post nasal drainage.  No cough.  No fevers, chills, and sweats.  No myalgias, headache.  No GI or GU .symptoms   Objective:  Appearance:  Patient appears healthy, stated age, and in no acute distress  Eyes:  Pupils are equal, round, and reactive to light and accomdation.  Extraocular movement is intact.  Conjunctivae are not inflamed.  Ears:  Canals normal.  Tympanic membranes normal but right TM has serous effusion. Nose:  Normal septum.  Normal turbinates, congested on right.   No sinus tenderness present.  Pharynx:  Normal  Neck:  Supple.  No adenopathy is present.  No thyromegaly is present Lungs:  Clear to auscultation.  Breath sounds are equal.  Heart:  Regular rate and rhythm without murmurs, rubs, or gallops.  Abdomen:  Nontender without masses or hepatosplenomegaly.  Bowel sounds are present.  No CVA or flank tenderness.   Assessment:  OTITIS MEDIA, SEROUS, ACUTE, RIGHT (ICD-381.01), suspect acute early viral URI  Plan:  Treat symptomatically for now:  Increase fluid intake, begin expectorant/decongestant, cough suppressant if cough worsens at night.   If fever/chills/sweats persist, or if not improving 5 to 7 days begin Z-pack (given Rx to hold).  Followup with PCP if not improving 10 to 14 days.

## 2010-01-31 NOTE — Assessment & Plan Note (Signed)
Summary: viral URI   Vital Signs:  Patient Profile:   30 Years Old Female Height:     64.75 inches Weight:      150 pounds BMI:     25.25 O2 Sat:      100 % Temp:     97.2 degrees F oral Pulse rate:   92 / minute BP sitting:   115 / 82  (left arm) Cuff size:   regular  Vitals Entered By: Harlene Salts (March 24, 2007 9:59 AM)             Is Patient Diabetic? No     Chief Complaint:  Cold & URI symptoms.  Acute Visit History:      The patient complains of cough, earache, eye symptoms, nasal discharge, nausea, and sore throat.  These symptoms began 5 days ago.  She denies abdominal pain, chest pain, diarrhea, fever, headache, musculoskeletal symptoms, rash, and sinus problems.  Other comments include: Started with hot flashes and nausea.  Then had itchy throat and laryngitis.  Feels tired.  Taking Robitussin cough and Ibuprofen.  Itchy watery eyes.  .        The cough interferes with her sleep.  The character of the cough is described as nonproductive.  She has no history of COPD.  There is no history of wheezing or shortness of breath associated with her cough.        The earache is located on both sides.           Current Allergies: No known allergies   Past Medical History:    Reviewed history from 10/16/2006 and no changes required:       G1P1   Social History:    Reviewed history from 10/16/2006 and no changes required:       Works at [employer], Medical laboratory scientific officer.       Married with a young daughter , [name].       Occas ETOH, social smoker < 2/ wk       No formal exercise.   Risk Factors:  Tobacco use:  never   Review of Systems      See HPI   Physical Exam  General:     alert, well-developed, well-nourished, and well-hydrated.   Head:     normocephalic and atraumatic.  sinuses NTTP Eyes:     eyes slightly watery, conjunctiva clear, wears glasses, EOMI Ears:     EACs patent; TMs translucent and gray with good cone of light and bony  landmarks.  Nose:     nasal congestion Mouth:     1+ tonsilar enlargement, throat injected with cobblestoning, no exudates or vesicles hoarse voice Neck:     supple and no masses.   Lungs:     Normal respiratory effort, chest expands symmetrically. Lungs are clear to auscultation, no crackles or wheezes. Heart:     Normal rate and regular rhythm. S1 and S2 normal without gallop, murmur, click, rub or other extra sounds. Skin:     color normal and no rashes.  skin warm and dry Cervical Nodes:     shotty bilat anterior cervical chain LA    Impression & Recommendations:  Problem # 1:  VIRAL URI (ICD-465.9) Viral URI with laryngitis.  Supportive care with Claritin D and Tessalon Perles, Rest,Ibuprofen and plenty of clears.  Call if not improved in 4 days.  Avoid household spread.  Out of work today and tomorow for voice rest. Her updated medication list for this  problem includes:    Tessalon Perles 100 Mg Caps (Benzonatate) .Marland Kitchen... 1 tab by mouth three times a day as needed cough   Complete Medication List: 1)  Mirena 20 Mcg/24hr Iud (Levonorgestrel) .... Use as directed 2)  Klonopin 0.5 Mg Tabs (Clonazepam) .Marland Kitchen.. 1 tab by mouth bid as needed anxiety 3)  Tessalon Perles 100 Mg Caps (Benzonatate) .Marland Kitchen.. 1 tab by mouth three times a day as needed cough   Patient Instructions: 1)  Start OTC Claritin D for itchy throat and head congestion. 2)  OK to take Ibuprofen 400-600 mg 3-4 x a day for aches/ pains. 3)  Plenty of clear liquids. 4)  Tessalon Perles as needed for cough.  They will not make you drowsy. 5)  Call if not improving by Friday morning.      Prescriptions: TESSALON PERLES 100 MG  CAPS (BENZONATATE) 1 tab by mouth three times a day as needed cough  #24 x 0   Entered and Authorized by:   [name] DO   Signed by:   [name] DO on 03/24/2007   Method used:   Electronically sent to ...       [facility]*       [address] [place], [place]  [address]        Ph: [phone]       Fax: [phone]   RxID:   [id number]  ]

## 2010-01-31 NOTE — Progress Notes (Signed)
Summary: LAB RESULTS  ---- Converted from flag ---- ---- 10/31/2006 9:54 AM, [name] DO wrote: Pls call and let pt know her estradiol level is normal ------------------------------ PATIENT INFORMED.LM Phone Note Outgoing Call Call back at [facility] Phone [phone]   Call placed by: Harlene Salts,  October 31, 2006 5:48 PM Call placed to: Patient Reason for Call: Discuss lab or test results

## 2010-02-02 NOTE — Assessment & Plan Note (Signed)
Summary: URI   Vital Signs:  Patient Profile:   30 Years Old Female Height:     64.75 inches O2 Sat:      99 % O2 treatment:    Room Air Temp:     99 degrees F oral Pulse rate:   84 / minute Resp:     18 per minute BP sitting:   134 / 85  (right arm)                  Current Allergies: No known allergies History of Present Illness History from: patient History of Present Illness: 30 Years Old Female complains of onset of cold symptoms for 2 days.  Chastelyn has been using Claritin-D and cough drops which is helping a little bit.  She is an Building control surveyor at [employer]. + sore throat + cough No pleuritic pain No wheezing + nasal congestion + post-nasal drainage + sinus pain/pressure No chest congestion No itchy/red eyes + earache No hemoptysis No SOB + chills/sweats + fever No nausea No vomiting No abdominal pain No diarrhea No skin rashes No fatigue No myalgias + headache    Physical Exam General appearance: well developed, well nourished, no acute distress Ears: normal, no lesions or deformities Nasal: clear discharge Oral/Pharynx: tongue normal, posterior pharynx without erythema or exudate Neck: neck supple,  trachea midline, no masses Chest/Lungs: no rales, wheezes, or rhonchi bilateral, breath sounds equal without effort Heart: regular rate and  rhythm, no murmur Skin: no obvious rashes or lesions MSE: oriented to time, place, and person Assessment New Problems: UPPER RESPIRATORY INFECTION, ACUTE (ICD-465.9)  likely viral URI Rapid strep negative  Patient Education: Patient and/or caregiver instructed in the following: rest, fluids, Tylenol prn, Ibuprofen prn.  Plan New Orders: No Charge Patient Arrived (NCPA0) [NCPA0] Planning Comments:   Information sheet given on treatment of viral URI.  Continue Claritin-D, rest, hydration, OTC cough meds, Vitamin C.  Expect to feel worse in the next 2 days, then gradually better.  If she  continues to get worse, can consider other treatments like Rx cough meds or Prednisone.  At that time, she can return to clinic for re-evaluation.  This is a courtesy visit, no charge.   The patient and/or caregiver has been counseled thoroughly with regard to medications prescribed including dosage, schedule, interactions, rationale for use, and possible side effects and they verbalize understanding.  Diagnoses and expected course of recovery discussed and will return if not improved as expected or if the condition worsens. Patient and/or caregiver verbalized understanding.   Orders Added: 1)  No Charge Patient Arrived (NCPA0) [NCPA0]

## 2010-05-19 NOTE — H&P (Signed)
NAMEDANNIEL, GRENZ             ACCOUNT NO.:  [id number]   MEDICAL RECORD NO.:  [id number]          PATIENT TYPE:  INP   LOCATION:  9165                          FACILITY:  [facility]   PHYSICIAN:  Genia Del, M.D.DATE OF BIRTH:  January 04, 1980   DATE OF ADMISSION:  04/02/2004  DATE OF DISCHARGE:                                HISTORY & PHYSICAL   HISTORY AND PHYSICAL:  Ms. Alicia Sanders is a 30 year old G1, expected date of  delivery by ultrasound was 03/30/04 at 40+ weeks gestation.   REASON FOR ADMISSION:  Spontaneous labor.   HISTORY OF PRESENT ILLNESS:  Regular uterine contractions.  No vaginal  bleeding.  No fluid leak.  Good fetal movements.  No PIG symptoms.   PAST MEDICAL HISTORY:  Negative.   PAST SURGICAL HISTORY:  Positive for right hand surgery at age 3.   FAMILY HISTORY:  Past family history of hypertension and diabetes mellitus.   ALLERGIES:  No known drug allergies.   MEDICATIONS:  Prenatal vitamins.   SOCIAL HISTORY:  Married, nonsmoker.   HISTORY OF PRESENT PREGNANCY:  First trimester, normal, AB  positive.  Infectious disease workup negative.  Second trimester normal, triple test  within normal limits.  Ultrasound review of anatomy normal at 2-+ weeks.  Third trimester blood pressures remained normal.  Uterine height  corresponded well.  One hour GTT normal at 27+ weeks.  Repeat ultrasound at  28 weeks showed resolved choroid plexus cyst.  Group B strep was negative at  35+ weeks.   REVIEW OF SYSTEMS:  Constitutional:  Negative.  ENT negative.  Cardiovascular and respiratory negative.  GU and neurologic negative.  Dermatoneuro and endocrine negative.   PHYSICAL EXAMINATION:  No apparent distress.  Temperature 98.2, blood  pressure 132/87, pulse 109, respiratory rate 18.  Lungs are clear  bilaterally.  Regular cardiac rhythm.  Gravid uterus.  Cephalic  presentation.  Lower limbs with mild edema.  Fetal heart rate monitoring  140s.  Accelerations positive.  No  decelerations.  Uterine contractions  every three minutes, moderate.   IMPRESSION:  G1 40+ week gestation, spontaneous labor.  Fetal weight being  reassuring.  Group B strep negative.   PLAN:  Admit to labor and delivery, monitoring, expectant management for  probable vaginal delivery.      ML/MEDQ  D:  04/02/2004  T:  04/02/2004  Job:  [id number]

## 2010-07-11 ENCOUNTER — Inpatient Hospital Stay (INDEPENDENT_AMBULATORY_CARE_PROVIDER_SITE_OTHER)
Admission: RE | Admit: 2010-07-11 | Discharge: 2010-07-11 | Disposition: A | Discharge status: Home / Self Care | Payer: PRIVATE HEALTH INSURANCE | Source: Ambulatory Visit | Attending: Emergency Medicine | Admitting: Emergency Medicine

## 2010-07-11 ENCOUNTER — Encounter: Payer: Self-pay | Admitting: Emergency Medicine

## 2010-07-11 DIAGNOSIS — H60399 Other infective otitis externa, unspecified ear: Secondary | ICD-10-CM | POA: Insufficient documentation

## 2010-07-11 DIAGNOSIS — H9209 Otalgia, unspecified ear: Secondary | ICD-10-CM

## 2010-07-11 DIAGNOSIS — E039 Hypothyroidism, unspecified: Secondary | ICD-10-CM | POA: Insufficient documentation

## 2010-08-03 ENCOUNTER — Encounter: Payer: Self-pay | Admitting: Emergency Medicine

## 2010-08-03 ENCOUNTER — Inpatient Hospital Stay (INDEPENDENT_AMBULATORY_CARE_PROVIDER_SITE_OTHER)
Admission: RE | Admit: 2010-08-03 | Discharge: 2010-08-03 | Disposition: A | Discharge status: Home / Self Care | Payer: PRIVATE HEALTH INSURANCE | Source: Ambulatory Visit | Attending: Emergency Medicine | Admitting: Emergency Medicine

## 2010-08-03 DIAGNOSIS — M545 Low back pain, unspecified: Secondary | ICD-10-CM | POA: Insufficient documentation

## 2010-08-03 DIAGNOSIS — S239XXA Sprain of unspecified parts of thorax, initial encounter: Secondary | ICD-10-CM | POA: Insufficient documentation

## 2010-08-03 LAB — CONVERTED CEMR LAB
Bilirubin Urine: NEGATIVE
Blood in Urine, dipstick: NEGATIVE
Glucose, Urine, Semiquant: NEGATIVE
Ketones, urine, test strip: NEGATIVE
Nitrite: NEGATIVE
Protein, U semiquant: NEGATIVE
Specific Gravity, Urine: 1.02
Urobilinogen, UA: 0.2
WBC Urine, dipstick: NEGATIVE
pH: 6

## 2010-11-02 ENCOUNTER — Telehealth: Payer: Self-pay | Admitting: Family Medicine

## 2010-12-04 NOTE — Progress Notes (Signed)
Summary: R EAR PAIN/WB rm 4   Vital Signs:  Patient Profile:   30 Years Old Female CC:      RT ear pain and dizzy on and off x 2wks Height:     64.75 inches Weight:      159.8 pounds O2 Sat:      100 % O2 treatment:    Room Air Temp:     98.3 degrees F oral Pulse rate:   92 / minute Resp:     16 per minute BP sitting:   116 / 79  (left arm) Cuff size:   regular  Vitals Entered By: Clemens Catholic LPN (July 11, 2010 8:42 AM)                  Updated Prior Medication List: MIRENA 20 MCG/24HR  IUD (LEVONORGESTREL) use as directed KLONOPIN 0.5 MG  TABS (CLONAZEPAM) 1 tab by mouth bid as needed anxiety CLARITIN-D 24 HOUR 10-240 MG XR24H-TAB (LORATADINE-PSEUDOEPHEDRINE) 1 tab by mouth daily  Current Allergies: No known allergies History of Present Illness History from: patient Chief Complaint: RT ear pain and dizzy on and off x 2wks History of Present Illness: 30 Years Old Female complains of onset of cold symptoms for 2 weeks.  Lanisha has been using Ibuprofen which is helping a little bit. No sore throat No cough No pleuritic pain No wheezing No nasal congestion No post-nasal drainage No sinus pain/pressure No chest congestion No itchy/red eyes + R ear earache + mild dizzy No hemoptysis No SOB No chills/sweats No fever No nausea No vomiting No abdominal pain No diarrhea No skin rashes No fatigue No myalgias No headache   REVIEW OF SYSTEMS Constitutional Symptoms      Denies fever, chills, night sweats, weight loss, weight gain, and fatigue.  Eyes       Denies change in vision, eye pain, eye discharge, glasses, contact lenses, and eye surgery. Ear/Nose/Throat/Mouth       Complains of ear pain and sinus problems.      Denies hearing loss/aids, change in hearing, ear discharge, dizziness, frequent runny nose, frequent nose bleeds, sore throat, hoarseness, and tooth pain or bleeding.  Respiratory       Denies dry cough, productive cough, wheezing, shortness  of breath, asthma, bronchitis, and emphysema/COPD.  Cardiovascular       Denies murmurs, chest pain, and tires easily with exhertion.    Gastrointestinal       Denies stomach pain, nausea/vomiting, diarrhea, constipation, blood in bowel movements, and indigestion. Genitourniary       Denies painful urination, kidney stones, and loss of urinary control. Neurological       Denies paralysis, seizures, and fainting/blackouts. Musculoskeletal       Denies muscle pain, joint pain, joint stiffness, decreased range of motion, redness, swelling, muscle weakness, and gout.  Skin       Denies bruising, unusual mles/lumps or sores, and hair/skin or nail changes.  Psych       Denies mood changes, temper/anger issues, anxiety/stress, speech problems, depression, and sleep problems. Other Comments: pt c/o RT ear ? fluid, dizzy, HA and post nasal drip on and off x 2wks, worse x 2 days. no fever. she has taken Claritin D, IBF, and used a hot compress.   Past History:  Past Medical History: G1P1 Hypothyroidism  Past Surgical History: RT hand surgery 30 yo  Family History: mother- Graves Dz Family History Hypertension father- Family History High cholesterol  Social History: Reviewed history from  10/16/2006 and no changes required. 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 Occas ETOH, social smoker < 2/ wk No formal exercise. Physical Exam General appearance: well developed, well nourished, no acute distress Ears: R canal mild erythema and swelling, clear fluid behind TM bilaterally no erythema, pain with pressing tragus Nasal: mucosa pink, nonedematous, no septal deviation, turbinates normal Oral/Pharynx: tongue normal, posterior pharynx without erythema or exudate Chest/Lungs: no rales, wheezes, or rhonchi bilateral, breath sounds equal without effort Heart: regular rate and  rhythm, no murmur MSE: oriented to time, place, and person Assessment New  Problems: OTITIS EXTERNA (ICD-380.10) HYPOTHYROIDISM (ICD-244.9) EAR PAIN (ICD-388.70)   Plan New Medications/Changes: NEOMYCIN-POLYMYXIN-HC 3.5-10000-1 SUSP (NEOMYCIN-POLYMYXIN-HC) 4 gtts R ear two times a day for a week  #QS x 0, 07/11/2010, Hoyt Koch MD  New Orders: Est. Patient Level II [[address]] Planning Comments:   Use gtts as directed.  Use ibuprofen for pain.  Follow-up with your primary care physician if not improving or if getting worse   The patient and/or caregiver has been counseled thoroughly with regard to medications prescribed including dosage, schedule, interactions, rationale for use, and possible side effects and they verbalize understanding.  Diagnoses and expected course of recovery discussed and will return if not improved as expected or if the condition worsens. Patient and/or caregiver verbalized understanding.  Prescriptions: NEOMYCIN-POLYMYXIN-HC 3.5-10000-1 SUSP (NEOMYCIN-POLYMYXIN-HC) 4 gtts R ear two times a day for a week  #QS x 0   Entered and Authorized by:   Hoyt Koch MD   Signed by:   Hoyt Koch MD on 07/11/2010   Method used:   Print then Give to Patient   RxID:   [id number]   Orders Added: 1)  Est. Patient Level II [[address]]

## 2010-12-04 NOTE — Progress Notes (Signed)
Summary: BACK PAIN,BLOOD IN URINE,PAIN ON SIDE/TJ Room 4   Vital Signs:  Patient Profile:   30 Years Old Female CC:      hematuria, low back pain x 2 wks worse last two days Height:     64.75 inches Weight:      160 pounds O2 Sat:      100 % O2 treatment:    Room Air Temp:     97.9 degrees F oral Pulse rate:   67 / minute Pulse rhythm:   regular Resp:     14 per minute BP sitting:   125 / 83  (left arm) Cuff size:   regular  Vitals Entered By: Lajean Saver RN (August 03, 2010 1:06 PM)                  Updated Prior Medication List: MIRENA 20 MCG/24HR  IUD (LEVONORGESTREL) use as directed CLARITIN-D 24 HOUR 10-240 MG XR24H-TAB (LORATADINE-PSEUDOEPHEDRINE) 1 tab by mouth daily  Current Allergies: No known allergies History of Present Illness History from: patient Chief Complaint: hematuria, low back pain x 2 wks worse last two days History of Present Illness: Pt complains of right LBP Onset:2 wks ago Description/Quality of Pain: dull, sharp at times when move Intensity of pain: 8/10 Modifying Factors: worse to move, twist Trauma: None directly. Has been exercising more in the gym the past 2-3 weeks. Symptoms Worse with: movement Better with: rest. She requests to r/o urinary cause. Perhaps, occas urinary frequency. No dysuria or urgency or hematuria or fever. No incontinence. Denies chance of pregnancy. No hx of kidney stones.  Current Meds MIRENA 20 MCG/24HR  IUD (LEVONORGESTREL) use as directed CLARITIN-D 24 HOUR 10-240 MG XR24H-TAB (LORATADINE-PSEUDOEPHEDRINE) 1 tab by mouth daily FLEXERIL 10 MG TABS (CYCLOBENZAPRINE HCL) 1 hs as needed muscle relaxant ETODOLAC 500 MG TABS (ETODOLAC) 1 by mouth two times a day with food as needed for pain  REVIEW OF SYSTEMS Constitutional Symptoms      Denies fever, chills, night sweats, weight loss, weight gain, and fatigue.  Eyes       Denies change in vision, eye pain, eye discharge, glasses, contact lenses, and eye  surgery. Ear/Nose/Throat/Mouth       Denies hearing loss/aids, change in hearing, ear pain, ear discharge, dizziness, frequent runny nose, frequent nose bleeds, sinus problems, sore throat, hoarseness, and tooth pain or bleeding.  Respiratory       Denies dry cough, productive cough, wheezing, shortness of breath, asthma, bronchitis, and emphysema/COPD.  Cardiovascular       Denies murmurs, chest pain, and tires easily with exhertion.    Gastrointestinal       Denies stomach pain, nausea/vomiting, diarrhea, constipation, blood in bowel movements, and indigestion. Genitourniary       Denies painful urination, blood or discharge from vagina, kidney stones, and loss of urinary control. Neurological       Denies paralysis, seizures, and fainting/blackouts. Musculoskeletal       Denies muscle pain, joint pain, joint stiffness, decreased range of motion, redness, swelling, muscle weakness, and gout.  Skin       Denies bruising, unusual mles/lumps or sores, and hair/skin or nail changes.  Psych       Denies mood changes, temper/anger issues, anxiety/stress, speech problems, depression, and sleep problems.  Past History:  Past Medical History: Reviewed history from 07/11/2010 and no changes required. G1P1 Hypothyroidism  Past Surgical History: Reviewed history from 07/11/2010 and no changes required. RT hand surgery 30 yo  Family History: Reviewed history from 07/11/2010 and no changes required. mother- Graves Dz Family History Hypertension father- Family History High cholesterol  Social History: Reviewed history from 10/16/2006 and no changes required. 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 Occas ETOH, social smoker < 2/ wk No formal exercise. Physical Exam General appearance: well developed, well nourished,pleasant, cooperative female. no acute distress. Uncomfortable from back pain, splinting herself mildly. Neurological: grossly intact and  non-focal. Motor, sensory, DTR's intact and + bilat. Back: tender musculature left and right parahoracic mms. No direct  t-spine ttp. No c-spine or L-S spine ttp. Skin: no obvious rashes or lesions MSE: oriented to time, place, and person SLR neg bilat. Assessment New Problems: LOW BACK PAIN, ACUTE (ICD-724.2) BACK STRAIN, THORACIC (ICD-847.1)  UA- normal. No evidence of UTI or kidney stone. Neuro exam wnl, no evidence of nerve impingement.  Patient Education: Patient and/or caregiver instructed in the following: Tylenol prn, Ibuprofen prn.  Plan New Medications/Changes: ETODOLAC 500 MG TABS (ETODOLAC) 1 by mouth two times a day with food as needed for pain  #30 x 0, 08/03/2010, Lajean Manes MD FLEXERIL 10 MG TABS (CYCLOBENZAPRINE HCL) 1 hs as needed muscle relaxant  #15 x 0, 08/03/2010, Lajean Manes MD  New Orders: Est. Patient Level IV [[address]] UA Dipstick w/o Micro (automated)  [81003] Planning Comments:   Discussed acute back care. Heat.  Info given. We agree that imaging not indicated at this point. She delined referrral to ortho or PT.  Follow Up: Follow up on an as needed basis Follow Up: PCP or ortho if not improving in 1 week, sooner prn.  The patient and/or caregiver has been counseled thoroughly with regard to medications prescribed including dosage, schedule, interactions, rationale for use, and possible side effects and they verbalize understanding.  Diagnoses and expected course of recovery discussed and will return if not improved as expected or if the condition worsens. Patient and/or caregiver verbalized understanding.  Prescriptions: ETODOLAC 500 MG TABS (ETODOLAC) 1 by mouth two times a day with food as needed for pain  #30 x 0   Entered and Authorized by:   Lajean Manes MD   Signed by:   Lajean Manes MD on 08/03/2010   Method used:   Electronically to        [facility]* (retail)       [address] [place], [place]  [address]       Ph: [phone]        Fax: [phone]   RxID:   [id number] FLEXERIL 10 MG TABS (CYCLOBENZAPRINE HCL) 1 hs as needed muscle relaxant  #15 x 0   Entered and Authorized by:   Lajean Manes MD   Signed by:   Lajean Manes MD on 08/03/2010   Method used:   Electronically to        [facility]* (retail)       [address] [place], [place]  [address]       Ph: [phone]       Fax: [phone]   RxID:   [id number]   Orders Added: 1)  Est. Patient Level IV [[address]] 2)  UA Dipstick w/o Micro (automated)  [81003]    Laboratory Results   Urine Tests  Date/Time Received: August 03, 2010 1:17 PM  Date/Time Reported: August 03, 2010 1:17 PM   Routine Urinalysis   Color:  yellow Appearance: Clear Glucose: negative   (Normal Range: Negative) Bilirubin: negative   (Normal Range: Negative) Ketone: negative   (Normal Range: Negative) Spec. Gravity: 1.020   (Normal Range: 1.003-1.035) Blood: negative   (Normal Range: Negative) pH: 6.0   (Normal Range: 5.0-8.0) Protein: negative   (Normal Range: Negative) Urobilinogen: 0.2   (Normal Range: 0-1) Nitrite: negative   (Normal Range: Negative) Leukocyte Esterace: negative   (Normal Range: Negative)

## 2010-12-04 NOTE — Telephone Encounter (Signed)
  Phone Note Call from Patient   Summary of Call: pt called and request refill of  ELOCON 0.1 % CREA to [facility] in [place] for rash.  Initial call taken by: Clemens Catholic LPN,  November 02, 2010 10:54 AM    New/Updated Medications: ELOCON 0.1 % CREA (MOMETASONE FUROATE) Apply thin layer to affected area once a day for 3 to 5 days. Prescriptions: ELOCON 0.1 % CREA (MOMETASONE FUROATE) Apply thin layer to affected area once a day for 3 to 5 days.  #15gm x 1   Entered and Authorized by:   Donna Christen MD   Signed by:   Donna Christen MD on 11/02/2010   Method used:   Electronically to        [facility]* (retail)       [address] [place], [place]  [address]       Ph: [phone]       Fax: [phone]   RxID:   [id number]  Rx sent Donna Christen MD  November 02, 2010 2:01 PM

## 2011-07-10 ENCOUNTER — Ambulatory Visit (INDEPENDENT_AMBULATORY_CARE_PROVIDER_SITE_OTHER): Payer: 59 | Admitting: Physician Assistant

## 2011-07-10 ENCOUNTER — Encounter: Payer: Self-pay | Admitting: Physician Assistant

## 2011-07-10 VITALS — BP 129/83 | HR 71 | Ht 64.75 in | Wt 139.0 lb

## 2011-07-10 DIAGNOSIS — L309 Dermatitis, unspecified: Secondary | ICD-10-CM

## 2011-07-10 DIAGNOSIS — B070 Plantar wart: Secondary | ICD-10-CM

## 2011-07-10 DIAGNOSIS — L259 Unspecified contact dermatitis, unspecified cause: Secondary | ICD-10-CM

## 2011-07-10 MED ORDER — TRIAMCINOLONE 0.1 % CREAM:EUCERIN CREAM 1:1: 1.0000 "application " | TOPICAL_CREAM | Freq: Two times a day (BID) | CUTANEOUS | Status: DC

## 2011-07-10 NOTE — Patient Instructions (Signed)
Plantar Wart Warts are benign (noncancerous) growths of the outer skin layer. They can occur at any time in life but are most common during childhood and the teen years. Warts can occur on many skin surfaces of the body. When they occur on the underside (sole) of your foot they are called plantar warts. They often emerge in groups with several small warts encircling a larger growth. CAUSES  Human papillomavirus (HPV) is the cause of plantar warts. HPV attacks a break in the skin of the foot. Walking barefoot can lead to exposure to the wart virus. Plantar warts tend to develop over areas of pressure such as the heel and ball of the foot. Plantar warts often grow into the deeper layers of skin. They may spread to other areas of the sole but cannot spread to other areas of the body. SYMPTOMS  You may also notice a growth on the undersurface of your foot. The wart may grow directly into the sole of the foot, or rise above the surface of the skin on the sole of the foot, or both. They are most often flat from pressure. Warts generally do not cause itching but may cause pain in the area of the wart when you put weight on your foot. DIAGNOSIS  Diagnosis is made by physical examination. This means your caregiver discovers it while examining your foot.  TREATMENT  There are many ways to treat plantar warts. However, warts are very tough. Sometimes it is difficult to treat them so that they go away completely and do not grow back. Any treatment must be done regularly to work. If left untreated, most plantar warts will eventually disappear over a period of one to two years. Treatments you can do at home include:  Putting duct tape over the top of the wart (occlusion), has been found to be effective over several months. The duct tape should be removed each night and reapplied until the wart has disappeared.   Placing over-the-counter medications on top of the wart to help kill the wart virus and remove the wart  tissue (salicylic acid, cantharidin, and dichloroacetic acid ) are useful. These are called keratolytic agents. These medications make the skin soft and gradually layers will shed away. Theses compounds are usually placed on the wart each night and then covered with a band-aid. They are also available in pre-medicated band-aid form. Avoid surrounding skin when applying these liquids as these medications can burn healthy skin. The treatment may take several months of nightly use to be effective.   Cryotherapy to freeze the wart has recently become available over-the-counter for children 4 years and older. This system makes use of a soft narrow applicator connected to a bottle of compressed cold liquid that is applied directly to the wart. This medication can burn health skin and should be used with caution.   As with all over-the-counter medications, read the directions carefully before use.  Treatments generally done in your caregiver's office include:  Some aggressive treatments may cause discomfort, discoloration and scaring of the surrounding skin. The risks and benefits of treatment should be discussed with your caregiver.   Freezing the wart with liquid nitrogen (cryotherapy, see above).   Burning the wart with use of very high heat (cautery).   Injecting medication into the wart.   Surgically removing or laser treatment of the wart.   Your caregiver may refer you to a dermatologist for difficult to treat, large sized or large numbers of warts.  HOME CARE INSTRUCTIONS  Soak the affected area in warm water. Dry the area completely when you are done. Remove the top layer of softened skin, then apply the chosen topical medication and reapply a bandage.   Remove the bandage daily and file excess wart tissue (pumice stone works well for this purpose). Repeat the entire process daily or every other day for weeks until the plantar wart disappears.   Several brands of salicylic acid pads are  available as over-the-counter remedies.   Pain can be relieved by wearing a doughnut bandage. This is a bandage with a hole in it. The bandage is put on with the hole over the wart. This helps take the pressure off the wart and gives pain relief.  To help prevent plantar warts:  Wear shoes and socks and change them daily.   Keep feet clean and dry.   Check your feet and your children's feet regularly.   Avoid direct contact with warts on other people.   Have growths, or changes on your skin checked by your caregiver.  Document Released: 03/10/2003 Document Revised: 12/07/2010 Document Reviewed: 08/18/2008 [facility] Patient Information 2012 [place], [place].

## 2011-07-10 NOTE — Progress Notes (Signed)
  Subjective:    Patient ID: Alicia Sanders, female    DOB: [birth year], 30 y.o.   MRN: [id number]  HPI Painful wart appeared on bottom of right foot yesterday. Never had wart before. Not tried anything. Pressure makes worse. REst makes better.   Has rash in between fingers of both hands. Has to wash hands a lot at work. Wynelle Link exposure makes better. Not using alcohol based hand sanitizer. Has been putting OTC creams that are supposed to be very mosturizing. Not worsening but not getting any better.   Review of Systems     Objective:   Physical Exam  Constitutional: She is oriented to person, place, and time. She appears well-developed and well-nourished.  HENT:  Head: Normocephalic and atraumatic.  Cardiovascular: Normal rate.   Neurological: She is alert and oriented to person, place, and time.  Skin:       Erythematous scaly macule in between digits on both hands.   Raised papule(wart like) on bottom of right foot in between 2nd and 3rd toe. Painful to palpation.  Psychiatric: She has a normal mood and affect. Her behavior is normal.          Assessment & Plan:  Plantar wart- Cryotherapy Procedure Note  Pre-operative Diagnosis: Plantar Wart  Post-operative Diagnosis: Plantar Wart  Locations: right bottom of foot.  In between 2nd and 3rd toe.   Indications: painful  Anesthesia: not required   Procedure Details  History of allergy to iodine: no. Pacemaker? no.  Patient informed of risks (permanent scarring, infection, light or dark discoloration, bleeding, infection, weakness, numbness and recurrence of the lesion) and benefits of the procedure and verbal informed consent obtained.  The areas are treated with liquid nitrogen therapy, frozen until ice ball extended 2 mm beyond lesion, allowed to thaw, and treated again. The patient tolerated procedure well.  The patient was instructed on post-op care, warned that there may be blister formation, redness and pain.  Recommend OTC analgesia as needed for pain.  Condition: Stable  Complications: none.  Plan: 1. Instructed to keep the area dry and covered for 24-48h and clean thereafter. 2. Warning signs of infection were reviewed.   3. Recommended that the patient use OTC acetaminophen as needed for pain.  4. Follow up as needed.    Eczema- Gave Eucerin/Triamcinolone cream to use BID for up to two weeks. Keep hands mosturized.

## 2011-07-18 ENCOUNTER — Encounter: Payer: Self-pay | Admitting: Physician Assistant

## 2011-07-18 ENCOUNTER — Ambulatory Visit (INDEPENDENT_AMBULATORY_CARE_PROVIDER_SITE_OTHER): Payer: 59 | Admitting: Physician Assistant

## 2011-07-18 VITALS — BP 114/79 | HR 81 | Ht 64.75 in | Wt 138.0 lb

## 2011-07-18 DIAGNOSIS — B070 Plantar wart: Secondary | ICD-10-CM

## 2011-07-18 MED ORDER — CEPHALEXIN 500 MG PO CAPS: 500.0000 mg | ORAL_CAPSULE | Freq: Four times a day (QID) | ORAL | Status: AC

## 2011-07-18 NOTE — Patient Instructions (Addendum)
Increase to 800mg  three times a day for pain. Will give abx for infection. Keflex 500mg  4 x a day for 7 days.

## 2011-07-18 NOTE — Progress Notes (Signed)
  Subjective:    Patient ID: Alicia Sanders, female    DOB: [birth year], 31 y.o.   MRN: [id number]  HPI Patient comes into to recheck area where plantar wart was frozen 2 weeks ago. She is still in a lot of pain when she walks. She has noticed some redness around where blister popped along with some drainage. She keeps covered most of the time and she soaks in epson salt daily.    Review of Systems     Objective:   Physical Exam  Constitutional: She is oriented to person, place, and time. She appears well-developed and well-nourished.  Neurological: She is alert and oriented to person, place, and time.  Skin:       Bottom of right foot in between 2nd and 3rd toe area of maceration and soft skin where blister was. NO active drainage. Minimal erythema around the outside area where blister is located.   Psychiatric: She has a normal mood and affect. Her behavior is normal.          Assessment & Plan:  Plantar wart of right foot- Gave gauzes to place on bottom of foot where wart was frozen to help give support. Encouraged patient to let it be exposed to air at night to help wound heal better and not be so macerated. Soak no longer than daily in epson salt.Gave Keflex to use if noticed any increased discharge or redness around the wart. I encouraged her to wait to take Keflex until wound starts to look more infectious. Call office if not improving in next week. For pain can take ibuprofen 800mg  TID.

## 2011-08-16 ENCOUNTER — Other Ambulatory Visit: Payer: Self-pay | Admitting: *Deleted

## 2011-08-16 NOTE — Telephone Encounter (Signed)
Pt called stating she is flying for the 1st time and would like refill on clonazepam and a Rx for phenergan as discussed w/ [name] at last OV. Can you fill these for Pt?  Please advise.

## 2011-08-16 NOTE — Telephone Encounter (Signed)
Ok for 10 tabs clonazepam.  OK for 10 phenregan 25mg  Q 8 hr prn.

## 2011-08-17 MED ORDER — CLONAZEPAM 0.5 MG PO TABS: 0.5000 mg | ORAL_TABLET | Freq: Three times a day (TID) | ORAL | Status: DC | PRN

## 2011-08-17 MED ORDER — PROMETHAZINE HCL 25 MG PO TABS: 25.0000 mg | ORAL_TABLET | Freq: Three times a day (TID) | ORAL | Status: DC | PRN

## 2011-08-20 ENCOUNTER — Other Ambulatory Visit: Payer: Self-pay | Admitting: Physician Assistant

## 2011-08-20 MED ORDER — SALICYLIC ACID 27.5 % EX LIQD: 1.0000 "application " | Freq: Two times a day (BID) | CUTANEOUS | Status: DC

## 2011-08-20 NOTE — Progress Notes (Signed)
Call pt: Let her know salicylic acid has been called in to apply topically twice a day for 6 weeks. Keep wart covered while wearing socks and shoes and can uncover at night to expose to air. If not improving then will refer to dermatology.

## 2011-08-20 NOTE — Progress Notes (Signed)
Pt aware.

## 2011-09-10 ENCOUNTER — Encounter: Payer: Self-pay | Admitting: Physician Assistant

## 2011-09-10 ENCOUNTER — Ambulatory Visit (INDEPENDENT_AMBULATORY_CARE_PROVIDER_SITE_OTHER): Payer: 59 | Admitting: Physician Assistant

## 2011-09-10 VITALS — BP 121/83 | HR 66 | Ht 64.75 in | Wt 138.0 lb

## 2011-09-10 DIAGNOSIS — B070 Plantar wart: Secondary | ICD-10-CM

## 2011-09-10 MED ORDER — HYDROCODONE-ACETAMINOPHEN 5-325 MG PO TABS: 1.0000 | ORAL_TABLET | Freq: Three times a day (TID) | ORAL | Status: AC | PRN

## 2011-09-10 NOTE — Addendum Note (Signed)
Addended by: [name] on: 09/10/2011 02:29 PM   Modules accepted: Orders

## 2011-09-10 NOTE — Patient Instructions (Addendum)
Plantar Wart Warts are benign (noncancerous) growths of the outer skin layer. They can occur at any time in life but are most common during childhood and the teen years. Warts can occur on many skin surfaces of the body. When they occur on the underside (sole) of your foot they are called plantar warts. They often emerge in groups with several small warts encircling a larger growth. CAUSES  Human papillomavirus (HPV) is the cause of plantar warts. HPV attacks a break in the skin of the foot. Walking barefoot can lead to exposure to the wart virus. Plantar warts tend to develop over areas of pressure such as the heel and ball of the foot. Plantar warts often grow into the deeper layers of skin. They may spread to other areas of the sole but cannot spread to other areas of the body. SYMPTOMS  You may also notice a growth on the undersurface of your foot. The wart may grow directly into the sole of the foot, or rise above the surface of the skin on the sole of the foot, or both. They are most often flat from pressure. Warts generally do not cause itching but may cause pain in the area of the wart when you put weight on your foot. DIAGNOSIS  Diagnosis is made by physical examination. This means your caregiver discovers it while examining your foot.  TREATMENT  There are many ways to treat plantar warts. However, warts are very tough. Sometimes it is difficult to treat them so that they go away completely and do not grow back. Any treatment must be done regularly to work. If left untreated, most plantar warts will eventually disappear over a period of one to two years. Treatments you can do at home include:  Putting duct tape over the top of the wart (occlusion), has been found to be effective over several months. The duct tape should be removed each night and reapplied until the wart has disappeared.   Placing over-the-counter medications on top of the wart to help kill the wart virus and remove the wart  tissue (salicylic acid, cantharidin, and dichloroacetic acid ) are useful. These are called keratolytic agents. These medications make the skin soft and gradually layers will shed away. Theses compounds are usually placed on the wart each night and then covered with a band-aid. They are also available in pre-medicated band-aid form. Avoid surrounding skin when applying these liquids as these medications can burn healthy skin. The treatment may take several months of nightly use to be effective.   Cryotherapy to freeze the wart has recently become available over-the-counter for children 4 years and older. This system makes use of a soft narrow applicator connected to a bottle of compressed cold liquid that is applied directly to the wart. This medication can burn health skin and should be used with caution.   As with all over-the-counter medications, read the directions carefully before use.  Treatments generally done in your caregiver's office include:  Some aggressive treatments may cause discomfort, discoloration and scaring of the surrounding skin. The risks and benefits of treatment should be discussed with your caregiver.   Freezing the wart with liquid nitrogen (cryotherapy, see above).   Burning the wart with use of very high heat (cautery).   Injecting medication into the wart.   Surgically removing or laser treatment of the wart.   Your caregiver may refer you to a dermatologist for difficult to treat, large sized or large numbers of warts.  HOME CARE INSTRUCTIONS     Soak the affected area in warm water. Dry the area completely when you are done. Remove the top layer of softened skin, then apply the chosen topical medication and reapply a bandage.   Remove the bandage daily and file excess wart tissue (pumice stone works well for this purpose). Repeat the entire process daily or every other day for weeks until the plantar wart disappears.   Several brands of salicylic acid pads are  available as over-the-counter remedies.   Pain can be relieved by wearing a doughnut bandage. This is a bandage with a hole in it. The bandage is put on with the hole over the wart. This helps take the pressure off the wart and gives pain relief.  To help prevent plantar warts:  Wear shoes and socks and change them daily.   Keep feet clean and dry.   Check your feet and your children's feet regularly.   Avoid direct contact with warts on other people.   Have growths, or changes on your skin checked by your caregiver.  Document Released: 03/10/2003 Document Revised: 12/07/2010 Document Reviewed: 08/18/2008 [employer] Patient Information 2012 [employer]. 

## 2011-09-10 NOTE — Progress Notes (Signed)
  Subjective:    Patient ID: Alicia Sanders, female    DOB: 1980/09/01, 30 y.o.   MRN: [id number]  HPI Patient is a 31 year old female who presents to the clinic with a plantar wart on the bottom of her right foot. I had seen her for this twice. We have used cryotherapy and treated a plantar wart and then she was given salicylic acid to use twice a day for the last 6 weeks. The wart has not changed in size and become more painful. She reports that there is even pain extending into her third and fourth metatarsal. Occasionally patient even experiences numbness and tingling radiating up the third and fourth metatarsal of the right foot. She denies any signs of infection such as redness, warmth, pus. She uses ibuprofen 800 mg during the day and this helps minimally. She has a job where she is on her feet a lot and not able to rest. By the time she gets home at night she reports that the pain is 8/10. She has not exercised regularly due to the pain. She has not been to see a dermatologist.   Review of Systems     Objective:   Physical Exam  Constitutional: She appears well-developed and well-nourished.  Skin:       Plantar wart in between the third and fourth metatarsal at the base of the sole on the right foot. There appears to be no erythema, pus, blood or any other signs of infection. Toes are a healthy color negative for cyanosis or necrosis. Pedal pulses are 2+ bilaterally. I was not able to assess capillary refill since patient had finger nail polish.  Psychiatric: She has a normal mood and affect. Her behavior is normal.          Assessment & Plan:  Plantar wart of right foot-I reassured patient that I still believe this is a plantar wart with no other complications. Since it has not responded to current therapy and salicylic acid I believe that she needs a referral to dermatology. We will make this referral and try to get her in as soon as possible. In the meantime I will give her  Vicodin to use only at night after work for pain. Continue to use Motrin during the day. I gave handout on plantar wart something to remember as far as symptomatic care. I do not think the salicylic acid is helping however she can continue to use until her dermatology appointment. Reeducated patient about keeping wart covered after medication administered.

## 2012-01-03 ENCOUNTER — Ambulatory Visit
Admission: RE | Admit: 2012-01-03 | Discharge: 2012-01-03 | Disposition: A | Discharge status: Home / Self Care | Payer: PRIVATE HEALTH INSURANCE | Source: Ambulatory Visit | Attending: Physician Assistant | Admitting: Physician Assistant

## 2012-01-03 ENCOUNTER — Ambulatory Visit (INDEPENDENT_AMBULATORY_CARE_PROVIDER_SITE_OTHER): Payer: PRIVATE HEALTH INSURANCE | Admitting: Physician Assistant

## 2012-01-03 ENCOUNTER — Encounter: Payer: Self-pay | Admitting: Physician Assistant

## 2012-01-03 VITALS — BP 128/85 | HR 89 | Temp 97.8°F | Resp 16 | Wt 133.0 lb

## 2012-01-03 DIAGNOSIS — M545 Low back pain, unspecified: Secondary | ICD-10-CM

## 2012-01-03 MED ORDER — KETOROLAC TROMETHAMINE 60 MG/2ML IM SOLN
60.0000 mg | Freq: Once | INTRAMUSCULAR | Status: AC
  Administered 2012-01-03: 60 mg via INTRAMUSCULAR

## 2012-01-03 MED ORDER — PREDNISONE 50 MG PO TABS: ORAL_TABLET | ORAL | Status: DC

## 2012-01-03 MED ORDER — MELOXICAM 7.5 MG PO TABS: 7.5000 mg | ORAL_TABLET | Freq: Every day | ORAL | Status: DC

## 2012-01-03 MED ORDER — TRAMADOL HCL 50 MG PO TABS: 50.0000 mg | ORAL_TABLET | Freq: Three times a day (TID) | ORAL | Status: DC | PRN

## 2012-01-03 MED ORDER — CYCLOBENZAPRINE HCL 10 MG PO TABS: 10.0000 mg | ORAL_TABLET | Freq: Every evening | ORAL | Status: DC | PRN
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
## 2012-01-03 NOTE — Patient Instructions (Addendum)
Low Back Strain with Rehab A strain is an injury in which a tendon or muscle is torn. The muscles and tendons of the lower back are vulnerable to strains. However, these muscles and tendons are very strong and require a great force to be injured. Strains are classified into three categories. Grade 1 strains cause pain, but the tendon is not lengthened. Grade 2 strains include a lengthened ligament, due to the ligament being stretched or partially ruptured. With grade 2 strains there is still function, although the function may be decreased. Grade 3 strains involve a complete tear of the tendon or muscle, and function is usually impaired. SYMPTOMS   Pain in the lower back.  Pain that affects one side more than the other.  Pain that gets worse with movement and may be felt in the hip, buttocks, or back of the thigh.  Muscle spasms of the muscles in the back.  Swelling along the muscles of the back.  Loss of strength of the back muscles.  Crackling sound (crepitation) when the muscles are touched. CAUSES  Lower back strains occur when a force is placed on the muscles or tendons that is greater than they can handle. Common causes of injury include:  Prolonged overuse of the muscle-tendon units in the lower back, usually from incorrect posture.  A single violent injury or force applied to the back. RISK INCREASES WITH:  Sports that involve twisting forces on the spine or a lot of bending at the waist (football, rugby, weightlifting, bowling, golf, tennis, speed skating, racquetball, swimming, running, gymnastics, diving).  Poor strength and flexibility.  Failure to warm up properly before activity.  Family history of lower back pain or disk disorders.  Previous back injury or surgery (especially fusion).  Poor posture with lifting, especially heavy objects.  Prolonged sitting, especially with poor posture. PREVENTION   Learn and use proper posture when sitting or lifting (maintain  proper posture when sitting, lift using the knees and legs, not at the waist).  Warm up and stretch properly before activity.  Allow for adequate recovery between workouts.  Maintain physical fitness:  Strength, flexibility, and endurance.  Cardiovascular fitness. PROGNOSIS  If treated properly, lower back strains usually heal within 6 weeks. RELATED COMPLICATIONS   Recurring symptoms, resulting in a chronic problem.  Chronic inflammation, scarring, and partial muscle-tendon tear.  Delayed healing or resolution of symptoms.  Prolonged disability. TREATMENT  Treatment first involves the use of ice and medicine, to reduce pain and inflammation. The use of strengthening and stretching exercises may help reduce pain with activity. These exercises may be performed at home or with a therapist. Severe injuries may require referral to a therapist for further evaluation and treatment, such as ultrasound. Your caregiver may advise that you wear a back brace or corset, to help reduce pain and discomfort. Often, prolonged bed rest results in greater harm then benefit. Corticosteroid injections may be recommended. However, these should be reserved for the most serious cases. It is important to avoid using your back when lifting objects. At night, sleep on your back on a firm mattress with a pillow placed under your knees. If non-surgical treatment is unsuccessful, surgery may be needed.  MEDICATION   If pain medicine is needed, nonsteroidal anti-inflammatory medicines (aspirin and ibuprofen), or other minor pain relievers (acetaminophen), are often advised.  Do not take pain medicine for 7 days before surgery.  Prescription pain relievers may be given, if your caregiver thinks they are needed. Use only as   directed and only as much as you need.  Ointments applied to the skin may be helpful.  Corticosteroid injections may be given by your caregiver. These injections should be reserved for the most  serious cases, because they may only be given a certain number of times. HEAT AND COLD  Cold treatment (icing) should be applied for 10 to 15 minutes every 2 to 3 hours for inflammation and pain, and immediately after activity that aggravates your symptoms. Use ice packs or an ice massage.  Heat treatment may be used before performing stretching and strengthening activities prescribed by your caregiver, physical therapist, or athletic trainer. Use a heat pack or a warm water soak. SEEK MEDICAL CARE IF:   Symptoms get worse or do not improve in 2 to 4 weeks, despite treatment.  You develop numbness, weakness, or loss of bowel or bladder function.  New, unexplained symptoms develop. (Drugs used in treatment may produce side effects.) EXERCISES  RANGE OF MOTION (ROM) AND STRETCHING EXERCISES - Low Back Strain Most people with lower back pain will find that their symptoms get worse with excessive bending forward (flexion) or arching at the lower back (extension). The exercises which will help resolve your symptoms will focus on the opposite motion.  Your physician, physical therapist or athletic trainer will help you determine which exercises will be most helpful to resolve your lower back pain. Do not complete any exercises without first consulting with your caregiver. Discontinue any exercises which make your symptoms worse until you speak to your caregiver.  If you have pain, numbness or tingling which travels down into your buttocks, leg or foot, the goal of the therapy is for these symptoms to move closer to your back and eventually resolve. Sometimes, these leg symptoms will get better, but your lower back pain may worsen. This is typically an indication of progress in your rehabilitation. Be very alert to any changes in your symptoms and the activities in which you participated in the 24 hours prior to the change. Sharing this information with your caregiver will allow him/her to most efficiently  treat your condition.  These exercises may help you when beginning to rehabilitate your injury. Your symptoms may resolve with or without further involvement from your physician, physical therapist or athletic trainer. While completing these exercises, remember:  Restoring tissue flexibility helps normal motion to return to the joints. This allows healthier, less painful movement and activity.  An effective stretch should be held for at least 30 seconds.  A stretch should never be painful. You should only feel a gentle lengthening or release in the stretched tissue. FLEXION RANGE OF MOTION AND STRETCHING EXERCISES: STRETCH  Flexion, Single Knee to Chest   Lie on a firm bed or floor with both legs extended in front of you.  Keeping one leg in contact with the floor, bring your opposite knee to your chest. Hold your leg in place by either grabbing behind your thigh or at your knee.  Pull until you feel a gentle stretch in your lower back. Hold __________ seconds.  Slowly release your grasp and repeat the exercise with the opposite side. Repeat __________ times. Complete this exercise __________ times per day.  STRETCH  Flexion, Double Knee to Chest   Lie on a firm bed or floor with both legs extended in front of you.  Keeping one leg in contact with the floor, bring your opposite knee to your chest.  Tense your stomach muscles to support your back and then   lift your other knee to your chest. Hold your legs in place by either grabbing behind your thighs or at your knees.  Pull both knees toward your chest until you feel a gentle stretch in your lower back. Hold __________ seconds.  Tense your stomach muscles and slowly return one leg at a time to the floor. Repeat __________ times. Complete this exercise __________ times per day.  STRETCH  Low Trunk Rotation  Lie on a firm bed or floor. Keeping your legs in front of you, bend your knees so they are both pointed toward the ceiling and  your feet are flat on the floor.  Extend your arms out to the side. This will stabilize your upper body by keeping your shoulders in contact with the floor.  Gently and slowly drop both knees together to one side until you feel a gentle stretch in your lower back. Hold for __________ seconds.  Tense your stomach muscles to support your lower back as you bring your knees back to the starting position. Repeat the exercise to the other side. Repeat __________ times. Complete this exercise __________ times per day  EXTENSION RANGE OF MOTION AND FLEXIBILITY EXERCISES: STRETCH  Extension, Prone on Elbows   Lie on your stomach on the floor, a bed will be too soft. Place your palms about shoulder width apart and at the height of your head.  Place your elbows under your shoulders. If this is too painful, stack pillows under your chest.  Allow your body to relax so that your hips drop lower and make contact more completely with the floor.  Hold this position for __________ seconds.  Slowly return to lying flat on the floor. Repeat __________ times. Complete this exercise __________ times per day.  RANGE OF MOTION  Extension, Prone Press Ups  Lie on your stomach on the floor, a bed will be too soft. Place your palms about shoulder width apart and at the height of your head.  Keeping your back as relaxed as possible, slowly straighten your elbows while keeping your hips on the floor. You may adjust the placement of your hands to maximize your comfort. As you gain motion, your hands will come more underneath your shoulders.  Hold this position __________ seconds.  Slowly return to lying flat on the floor. Repeat __________ times. Complete this exercise __________ times per day.  RANGE OF MOTION- Quadruped, Neutral Spine   Assume a hands and knees position on a firm surface. Keep your hands under your shoulders and your knees under your hips. You may place padding under your knees for  comfort.  Drop your head and point your tail bone toward the ground below you. This will round out your lower back like an angry cat. Hold this position for __________ seconds.  Slowly lift your head and release your tail bone so that your back sags into a large arch, like an old horse.  Hold this position for __________ seconds.  Repeat this until you feel limber in your lower back.  Now, find your "sweet spot." This will be the most comfortable position somewhere between the two previous positions. This is your neutral spine. Once you have found this position, tense your stomach muscles to support your lower back.  Hold this position for __________ seconds. Repeat __________ times. Complete this exercise __________ times per day.  STRENGTHENING EXERCISES - Low Back Strain These exercises may help you when beginning to rehabilitate your injury. These exercises should be done near your "sweet   spot." This is the neutral, low-back arch, somewhere between fully rounded and fully arched, that is your least painful position. When performed in this safe range of motion, these exercises can be used for people who have either a flexion or extension based injury. These exercises may resolve your symptoms with or without further involvement from your physician, physical therapist or athletic trainer. While completing these exercises, remember:   Muscles can gain both the endurance and the strength needed for everyday activities through controlled exercises.  Complete these exercises as instructed by your physician, physical therapist or athletic trainer. Increase the resistance and repetitions only as guided.  You may experience muscle soreness or fatigue, but the pain or discomfort you are trying to eliminate should never worsen during these exercises. If this pain does worsen, stop and make certain you are following the directions exactly. If the pain is still present after adjustments, discontinue the  exercise until you can discuss the trouble with your caregiver. STRENGTHENING Deep Abdominals, Pelvic Tilt  Lie on a firm bed or floor. Keeping your legs in front of you, bend your knees so they are both pointed toward the ceiling and your feet are flat on the floor.  Tense your lower abdominal muscles to press your lower back into the floor. This motion will rotate your pelvis so that your tail bone is scooping upwards rather than pointing at your feet or into the floor.  With a gentle tension and even breathing, hold this position for __________ seconds. Repeat __________ times. Complete this exercise __________ times per day.  STRENGTHENING  Abdominals, Crunches   Lie on a firm bed or floor. Keeping your legs in front of you, bend your knees so they are both pointed toward the ceiling and your feet are flat on the floor. Cross your arms over your chest.  Slightly tip your chin down without bending your neck.  Tense your abdominals and slowly lift your trunk high enough to just clear your shoulder blades. Lifting higher can put excessive stress on the lower back and does not further strengthen your abdominal muscles.  Control your return to the starting position. Repeat __________ times. Complete this exercise __________ times per day.  STRENGTHENING  Quadruped, Opposite UE/LE Lift   Assume a hands and knees position on a firm surface. Keep your hands under your shoulders and your knees under your hips. You may place padding under your knees for comfort.  Find your neutral spine and gently tense your abdominal muscles so that you can maintain this position. Your shoulders and hips should form a rectangle that is parallel with the floor and is not twisted.  Keeping your trunk steady, lift your right hand no higher than your shoulder and then your left leg no higher than your hip. Make sure you are not holding your breath. Hold this position __________ seconds.  Continuing to keep your  abdominal muscles tense and your back steady, slowly return to your starting position. Repeat with the opposite arm and leg. Repeat __________ times. Complete this exercise __________ times per day.  STRENGTHENING  Lower Abdominals, Double Knee Lift  Lie on a firm bed or floor. Keeping your legs in front of you, bend your knees so they are both pointed toward the ceiling and your feet are flat on the floor.  Tense your abdominal muscles to brace your lower back and slowly lift both of your knees until they come over your hips. Be certain not to hold your breath.    Hold __________ seconds. Using your abdominal muscles, return to the starting position in a slow and controlled manner. Repeat __________ times. Complete this exercise __________ times per day.  POSTURE AND BODY MECHANICS CONSIDERATIONS - Low Back Strain Keeping correct posture when sitting, standing or completing your activities will reduce the stress put on different body tissues, allowing injured tissues a chance to heal and limiting painful experiences. The following are general guidelines for improved posture. Your physician or physical therapist will provide you with any instructions specific to your needs. While reading these guidelines, remember:  The exercises prescribed by your provider will help you have the flexibility and strength to maintain correct postures.  The correct posture provides the best environment for your joints to work. All of your joints have less wear and tear when properly supported by a spine with good posture. This means you will experience a healthier, less painful body.  Correct posture must be practiced with all of your activities, especially prolonged sitting and standing. Correct posture is as important when doing repetitive low-stress activities (typing) as it is when doing a single heavy-load activity (lifting). RESTING POSITIONS Consider which positions are most painful for you when choosing a  resting position. If you have pain with flexion-based activities (sitting, bending, stooping, squatting), choose a position that allows you to rest in a less flexed posture. You would want to avoid curling into a fetal position on your side. If your pain worsens with extension-based activities (prolonged standing, working overhead), avoid resting in an extended position such as sleeping on your stomach. Most people will find more comfort when they rest with their spine in a more neutral position, neither too rounded nor too arched. Lying on a non-sagging bed on your side with a pillow between your knees, or on your back with a pillow under your knees will often provide some relief. Keep in mind, being in any one position for a prolonged period of time, no matter how correct your posture, can still lead to stiffness. PROPER SITTING POSTURE In order to minimize stress and discomfort on your spine, you must sit with correct posture. Sitting with good posture should be effortless for a healthy body. Returning to good posture is a gradual process. Many people can work toward this most comfortably by using various supports until they have the flexibility and strength to maintain this posture on their own. When sitting with proper posture, your ears will fall over your shoulders and your shoulders will fall over your hips. You should use the back of the chair to support your upper back. Your lower back will be in a neutral position, just slightly arched. You may place a small pillow or folded towel at the base of your lower back for support.  When working at a desk, create an environment that supports good, upright posture. Without extra support, muscles tire, which leads to excessive strain on joints and other tissues. Keep these recommendations in mind: CHAIR:  A chair should be able to slide under your desk when your back makes contact with the back of the chair. This allows you to work closely.  The chair's  height should allow your eyes to be level with the upper part of your monitor and your hands to be slightly lower than your elbows. BODY POSITION  Your feet should make contact with the floor. If this is not possible, use a foot rest.  Keep your ears over your shoulders. This will reduce stress on your neck and   lower back. INCORRECT SITTING POSTURES  If you are feeling tired and unable to assume a healthy sitting posture, do not slouch or slump. This puts excessive strain on your back tissues, causing more damage and pain. Healthier options include:  Using more support, like a lumbar pillow.  Switching tasks to something that requires you to be upright or walking.  Talking a brief walk.  Lying down to rest in a neutral-spine position. PROLONGED STANDING WHILE SLIGHTLY LEANING FORWARD  When completing a task that requires you to lean forward while standing in one place for a long time, place either foot up on a stationary 2-4 inch high object to help maintain the best posture. When both feet are on the ground, the lower back tends to lose its slight inward curve. If this curve flattens (or becomes too large), then the back and your other joints will experience too much stress, tire more quickly, and can cause pain. CORRECT STANDING POSTURES Proper standing posture should be assumed with all daily activities, even if they only take a few moments, like when brushing your teeth. As in sitting, your ears should fall over your shoulders and your shoulders should fall over your hips. You should keep a slight tension in your abdominal muscles to brace your spine. Your tailbone should point down to the ground, not behind your body, resulting in an over-extended swayback posture.  INCORRECT STANDING POSTURES  Common incorrect standing postures include a forward head, locked knees and/or an excessive swayback. WALKING Walk with an upright posture. Your ears, shoulders and hips should all  line-up. PROLONGED ACTIVITY IN A FLEXED POSITION When completing a task that requires you to bend forward at your waist or lean over a low surface, try to find a way to stabilize 3 out of 4 of your limbs. You can place a hand or elbow on your thigh or rest a knee on the surface you are reaching across. This will provide you more stability so that your muscles do not fatigue as quickly. By keeping your knees relaxed, or slightly bent, you will also reduce stress across your lower back. CORRECT LIFTING TECHNIQUES DO :   Assume a wide stance. This will provide you more stability and the opportunity to get as close as possible to the object which you are lifting.  Tense your abdominals to brace your spine. Bend at the knees and hips. Keeping your back locked in a neutral-spine position, lift using your leg muscles. Lift with your legs, keeping your back straight.  Test the weight of unknown objects before attempting to lift them.  Try to keep your elbows locked down at your sides in order get the best strength from your shoulders when carrying an object.  Always ask for help when lifting heavy or awkward objects. INCORRECT LIFTING TECHNIQUES DO NOT:   Lock your knees when lifting, even if it is a small object.  Bend and twist. Pivot at your feet or move your feet when needing to change directions.  Assume that you can safely pick up even a paper clip without proper posture. Document Released: 12/18/2004 Document Revised: 03/12/2011 Document Reviewed: 04/01/2008 [facility] Patient Information 2013 [place], [place]. Sciatica Sciatica is pain, weakness, numbness, or tingling along the path of the sciatic nerve. The nerve starts in the lower back and runs down the back of each leg. The nerve controls the muscles in the lower leg and in the back of the knee, while also providing sensation to the back of the  thigh, lower leg, and the sole of your foot. Sciatica is a symptom of another medical condition.  For instance, nerve damage or certain conditions, such as a herniated disk or bone spur on the spine, pinch or put pressure on the sciatic nerve. This causes the pain, weakness, or other sensations normally associated with sciatica. Generally, sciatica only affects one side of the body. CAUSES   Herniated or slipped disc.  Degenerative disk disease.  A pain disorder involving the narrow muscle in the buttocks (piriformis syndrome).  Pelvic injury or fracture.  Pregnancy.  Tumor (rare). SYMPTOMS  Symptoms can vary from mild to very severe. The symptoms usually travel from the low back to the buttocks and down the back of the leg. Symptoms can include:  Mild tingling or dull aches in the lower back, leg, or hip.  Numbness in the back of the calf or sole of the foot.  Burning sensations in the lower back, leg, or hip.  Sharp pains in the lower back, leg, or hip.  Leg weakness.  Severe back pain inhibiting movement. These symptoms may get worse with coughing, sneezing, laughing, or prolonged sitting or standing. Also, being overweight may worsen symptoms. DIAGNOSIS  Your caregiver will perform a physical exam to look for common symptoms of sciatica. He or she may ask you to do certain movements or activities that would trigger sciatic nerve pain. Other tests may be performed to find the cause of the sciatica. These may include:  Blood tests.  X-rays.  Imaging tests, such as an MRI or CT scan. TREATMENT  Treatment is directed at the cause of the sciatic pain. Sometimes, treatment is not necessary and the pain and discomfort goes away on its own. If treatment is needed, your caregiver may suggest:  Over-the-counter medicines to relieve pain.  Prescription medicines, such as anti-inflammatory medicine, muscle relaxants, or narcotics.  Applying heat or ice to the painful area.  Steroid injections to lessen pain, irritation, and inflammation around the nerve.  Reducing activity  during periods of pain.  Exercising and stretching to strengthen your abdomen and improve flexibility of your spine. Your caregiver may suggest losing weight if the extra weight makes the back pain worse.  Physical therapy.  Surgery to eliminate what is pressing or pinching the nerve, such as a bone spur or part of a herniated disk. HOME CARE INSTRUCTIONS   Only take over-the-counter or prescription medicines for pain or discomfort as directed by your caregiver.  Apply ice to the affected area for 20 minutes, 3 4 times a day for the first 48 72 hours. Then try heat in the same way.  Exercise, stretch, or perform your usual activities if these do not aggravate your pain.  Attend physical therapy sessions as directed by your caregiver.  Keep all follow-up appointments as directed by your caregiver.  Do not wear high heels or shoes that do not provide proper support.  Check your mattress to see if it is too soft. A firm mattress may lessen your pain and discomfort. SEEK IMMEDIATE MEDICAL CARE IF:   You lose control of your bowel or bladder (incontinence).  You have increasing weakness in the lower back, pelvis, buttocks, or legs.  You have redness or swelling of your back.  You have a burning sensation when you urinate.  You have pain that gets worse when you lie down or awakens you at night.  Your pain is worse than you have experienced in the past.  Your pain is  lasting longer than 4 weeks.  You are suddenly losing weight without reason. MAKE SURE YOU:  Understand these instructions.  Will watch your condition.  Will get help right away if you are not doing well or get worse. Document Released: 12/12/2000 Document Revised: 06/19/2011 Document Reviewed: 04/29/2011 [facility] Patient Information 2013 [place], [place].

## 2012-01-07 ENCOUNTER — Telehealth: Payer: Self-pay | Admitting: *Deleted

## 2012-01-07 MED ORDER — HYDROCODONE-ACETAMINOPHEN 5-325 MG PO TABS: 1.0000 | ORAL_TABLET | Freq: Three times a day (TID) | ORAL | Status: DC | PRN

## 2012-01-07 NOTE — Telephone Encounter (Signed)
Will give stronger pain medication. Sent to fax. If not improving in next week or so will get MRI and discuss referral to Orthopedics.

## 2012-01-07 NOTE — Telephone Encounter (Signed)
Spoke to pt. rx faxed to [facility] [place].

## 2012-01-07 NOTE — Telephone Encounter (Signed)
Pt called requesting a different pain medication to take at night. Pt reports that her back pain is no better and she is still not able sleep due to pain.

## 2012-02-23 ENCOUNTER — Other Ambulatory Visit: Payer: Self-pay | Admitting: Physician Assistant

## 2012-02-24 ENCOUNTER — Other Ambulatory Visit: Payer: Self-pay | Admitting: Physician Assistant

## 2012-02-25 ENCOUNTER — Telehealth: Payer: Self-pay

## 2012-02-25 NOTE — Telephone Encounter (Signed)
I will ok one more refill. I do not see referral for PT. I would like to try this and if still not improving get an MRI.

## 2012-02-25 NOTE — Telephone Encounter (Signed)
Alicia Sanders states she still has low back pain. She is calling to see if Lesly Rubenstein could order the MRI they have discussed. [facility] [address].

## 2012-02-26 NOTE — Telephone Encounter (Signed)
LMOM to return call. Serenitee Fuertes, LPN  

## 2012-02-26 NOTE — Telephone Encounter (Signed)
I don't see PT records in charts. Let's try PT since insurance will not pay for MRI until try for 6-8 weeks. Will order. Do you have location preference?

## 2012-02-26 NOTE — Telephone Encounter (Signed)
Spoke with pt and she is going to call PT dept. And find out how much it will cost and will get back with [place]. Barry Dienes, LPN

## 2012-03-13 ENCOUNTER — Other Ambulatory Visit: Payer: Self-pay | Admitting: Obstetrics and Gynecology

## 2012-03-13 DIAGNOSIS — N6320 Unspecified lump in the left breast, unspecified quadrant: Secondary | ICD-10-CM

## 2012-03-13 DIAGNOSIS — N644 Mastodynia: Secondary | ICD-10-CM

## 2012-03-25 ENCOUNTER — Other Ambulatory Visit: Payer: PRIVATE HEALTH INSURANCE

## 2012-03-26 ENCOUNTER — Ambulatory Visit
Admission: RE | Admit: 2012-03-26 | Discharge: 2012-03-26 | Disposition: A | Discharge status: Home / Self Care | Payer: PRIVATE HEALTH INSURANCE | Source: Ambulatory Visit | Attending: Obstetrics and Gynecology | Admitting: Obstetrics and Gynecology

## 2012-03-26 DIAGNOSIS — N644 Mastodynia: Secondary | ICD-10-CM

## 2012-03-26 DIAGNOSIS — N6320 Unspecified lump in the left breast, unspecified quadrant: Secondary | ICD-10-CM

## 2012-04-14 ENCOUNTER — Other Ambulatory Visit: Payer: Self-pay | Admitting: Physician Assistant

## 2012-04-15 ENCOUNTER — Other Ambulatory Visit: Payer: Self-pay | Admitting: Family Medicine

## 2012-04-21 ENCOUNTER — Other Ambulatory Visit: Payer: Self-pay | Admitting: Physician Assistant

## 2012-04-21 NOTE — Telephone Encounter (Signed)
Patient calls and would like to get Klonopin and phenergan. States she is getting married and flying to [place] and you done this for her before. Barry Dienes, LPN

## 2012-05-08 ENCOUNTER — Encounter: Payer: Self-pay | Admitting: *Deleted

## 2012-05-08 ENCOUNTER — Encounter: Payer: Self-pay | Admitting: Sports Medicine

## 2012-05-08 ENCOUNTER — Ambulatory Visit (INDEPENDENT_AMBULATORY_CARE_PROVIDER_SITE_OTHER): Payer: PRIVATE HEALTH INSURANCE | Admitting: Sports Medicine

## 2012-05-08 VITALS — BP 113/77 | HR 76 | Temp 98.1°F | Wt 134.0 lb

## 2012-05-08 DIAGNOSIS — Z299 Encounter for prophylactic measures, unspecified: Secondary | ICD-10-CM | POA: Insufficient documentation

## 2012-05-08 DIAGNOSIS — M5136 Other intervertebral disc degeneration, lumbar region: Secondary | ICD-10-CM | POA: Insufficient documentation

## 2012-05-08 DIAGNOSIS — M51369 Other intervertebral disc degeneration, lumbar region without mention of lumbar back pain or lower extremity pain: Secondary | ICD-10-CM | POA: Insufficient documentation

## 2012-05-08 DIAGNOSIS — J0100 Acute maxillary sinusitis, unspecified: Secondary | ICD-10-CM

## 2012-05-08 DIAGNOSIS — M545 Low back pain, unspecified: Secondary | ICD-10-CM

## 2012-05-08 HISTORY — DX: Encounter for prophylactic measures, unspecified: Z29.9

## 2012-05-08 MED ORDER — PREDNISONE 50 MG PO TABS: 50.0000 mg | ORAL_TABLET | Freq: Every day | ORAL | Status: DC

## 2012-05-08 MED ORDER — AZITHROMYCIN 250 MG PO TABS: ORAL_TABLET | ORAL | Status: DC

## 2012-05-08 MED ORDER — FLUTICASONE PROPIONATE 50 MCG/ACT NA SUSP: NASAL | Status: DC

## 2012-05-08 NOTE — Assessment & Plan Note (Signed)
We will discuss a future visit.

## 2012-05-08 NOTE — Assessment & Plan Note (Signed)
Gets yeast infections with penicillin derivatives, we'll use azithromycin. Prednisone, Flonase . Return as needed.

## 2012-05-08 NOTE — Assessment & Plan Note (Signed)
Checking routine bloodwork. 

## 2012-05-08 NOTE — Progress Notes (Signed)
  Subjective:    CC: Sick  HPI: Alicia Sanders is a very pleasant 32 year old female comes in with a several-day history of pain and burning behind her maxillary sinuses radiating to the ears, described as pressure. Pain is worsening. She has itchy, watery eyes, as well as a sore throat. She denies any shortness of breath, or GI symptoms. Pain is moderate.   Hypothyroidism: Has not had her TSH checked in a long time. Has been having her OB/GYN manage this in the past.  Low back pain: Agrees to discuss this at a future visit.  Preventive measures: Cervical cancer screening is done by her OB/GYN, she has not had lipid or other screening in some time.  Past medical history, Surgical history, Family history not pertinant except as noted below, Social history, Allergies, and medications have been entered into the medical record, reviewed, and no changes needed.   Review of Systems: No fevers, chills, night sweats, weight loss, chest pain, or shortness of breath.   Objective:    General: Well Developed, well nourished, and in no acute distress.  Neuro: Alert and oriented x3, extra-ocular muscles intact, sensation grossly intact.  HEENT: Normocephalic, atraumatic, pupils equal round reactive to light, neck supple, no masses, no lymphadenopathy, thyroid nonpalpable. Oropharynx unremarkable, nasopharynx is erythematous and boggy turbinates, ear canals are unremarkable, tender to palpation over the maxillary sinuses. Skin: Warm and dry, no rashes. Cardiac: Regular rate and rhythm, no murmurs rubs or gallops, no lower extremity edema.  Respiratory: Clear to auscultation bilaterally. Not using accessory muscles, speaking in full sentences.  Impression and Recommendations:

## 2012-05-08 NOTE — Patient Instructions (Addendum)

## 2012-05-11 ENCOUNTER — Other Ambulatory Visit: Payer: Self-pay | Admitting: Physician Assistant

## 2012-05-12 ENCOUNTER — Other Ambulatory Visit: Payer: Self-pay | Admitting: Physician Assistant

## 2012-05-16 ENCOUNTER — Ambulatory Visit: Payer: PRIVATE HEALTH INSURANCE | Admitting: Sports Medicine

## 2012-06-02 LAB — COMPREHENSIVE METABOLIC PANEL
ALT: 14 U/L (ref 0–35)
AST: 14 U/L (ref 0–37)
Albumin: 4.1 g/dL (ref 3.5–5.2)
Alkaline Phosphatase: 46 U/L (ref 39–117)
BUN: 15 mg/dL (ref 6–23)
CO2: 23 mEq/L (ref 19–32)
Calcium: 9 mg/dL (ref 8.4–10.5)
Chloride: 107 mEq/L (ref 96–112)
Creat: 0.72 mg/dL (ref 0.50–1.10)
Glucose, Bld: 70 mg/dL (ref 70–99)
Potassium: 4.2 mEq/L (ref 3.5–5.3)
Sodium: 139 mEq/L (ref 135–145)
Total Bilirubin: 0.4 mg/dL (ref 0.3–1.2)
Total Protein: 6.7 g/dL (ref 6.0–8.3)

## 2012-06-02 LAB — LIPID PANEL
Cholesterol: 118 mg/dL (ref 0–200)
HDL: 43 mg/dL (ref >39)
LDL Cholesterol: 63 mg/dL (ref 0–99)
Total CHOL/HDL Ratio: 2.7 Ratio
Triglycerides: 61 mg/dL (ref <150)
VLDL: 12 mg/dL (ref 0–40)

## 2012-06-02 LAB — VITAMIN D 25 HYDROXY (VIT D DEFICIENCY, FRACTURES): Vit D, 25-Hydroxy: 40 ng/mL (ref 30–89)

## 2012-06-02 LAB — CBC
HCT: 39.7 % (ref 36.0–46.0)
Hemoglobin: 13.8 g/dL (ref 12.0–15.0)
MCH: 30.1 pg (ref 26.0–34.0)
MCHC: 34.8 g/dL (ref 30.0–36.0)
MCV: 86.5 fL (ref 78.0–100.0)
Platelets: 181 10*3/uL (ref 150–400)
RBC: 4.59 MIL/uL (ref 3.87–5.11)
RDW: 14 % (ref 11.5–15.5)
WBC: 8.6 10*3/uL (ref 4.0–10.5)

## 2012-06-02 LAB — TSH: TSH: 1.285 u[IU]/mL (ref 0.350–4.500)

## 2012-06-03 ENCOUNTER — Ambulatory Visit (INDEPENDENT_AMBULATORY_CARE_PROVIDER_SITE_OTHER): Payer: PRIVATE HEALTH INSURANCE | Admitting: Sports Medicine

## 2012-06-03 ENCOUNTER — Encounter: Payer: Self-pay | Admitting: Sports Medicine

## 2012-06-03 VITALS — BP 124/85 | HR 83

## 2012-06-03 DIAGNOSIS — M545 Low back pain, unspecified: Secondary | ICD-10-CM

## 2012-06-03 DIAGNOSIS — H6980 Other specified disorders of Eustachian tube, unspecified ear: Secondary | ICD-10-CM

## 2012-06-03 DIAGNOSIS — H6991 Unspecified Eustachian tube disorder, right ear: Secondary | ICD-10-CM | POA: Insufficient documentation

## 2012-06-03 DIAGNOSIS — J0100 Acute maxillary sinusitis, unspecified: Secondary | ICD-10-CM

## 2012-06-03 DIAGNOSIS — H6981 Other specified disorders of Eustachian tube, right ear: Secondary | ICD-10-CM | POA: Insufficient documentation

## 2012-06-03 DIAGNOSIS — H6990 Unspecified Eustachian tube disorder, unspecified ear: Secondary | ICD-10-CM

## 2012-06-03 MED ORDER — CHLORZOXAZONE 750 MG PO TABS: 1.0000 | ORAL_TABLET | Freq: Three times a day (TID) | ORAL | Status: DC

## 2012-06-03 MED ORDER — MELOXICAM 15 MG PO TABS: ORAL_TABLET | ORAL | Status: DC

## 2012-06-03 MED ORDER — PREDNISONE 50 MG PO TABS: ORAL_TABLET | ORAL | Status: DC

## 2012-06-03 MED ORDER — TRAMADOL HCL 50 MG PO TABS: 50.0000 mg | ORAL_TABLET | Freq: Three times a day (TID) | ORAL | Status: DC | PRN

## 2012-06-03 NOTE — Progress Notes (Signed)
  Subjective:    CC: Low back pain  HPI: Acute sinusitis: Resolved.  Left ear fullness and popping: Mild, occurs daily. Using Flonase. No ear pain, no drainage, no trauma.  Low back pain: Has been present for several years, she localizes in the midline of the lumbar spine with radiation down the left buttock. Pain is worse when sitting, riding in a car and with Valsalva. It is not worse with extension, no constitutional symptoms, no bowel or bladder dysfunction. She's taking meloxicam 7.5 mg which is moderately effective. Has done some formal physical therapy.  Past medical history, Surgical history, Family history not pertinant except as noted below, Social history, Allergies, and medications have been entered into the medical record, reviewed, and no changes needed.   Review of Systems: No fevers, chills, night sweats, weight loss, chest pain, or shortness of breath.   Objective:    General: Well Developed, well nourished, and in no acute distress.  Neuro: Alert and oriented x3, extra-ocular muscles intact, sensation grossly intact.  HEENT: Normocephalic, atraumatic, pupils equal round reactive to light, neck supple, no masses, no lymphadenopathy, thyroid nonpalpable. Turbinates remained boggy and erythematous. External auditory canals are unremarkable. Skin: Warm and dry, no rashes. Cardiac: Regular rate and rhythm, no murmurs rubs or gallops, no lower extremity edema.  Respiratory: Clear to auscultation bilaterally. Not using accessory muscles, speaking in full sentences. Back Exam:  Inspection: Unremarkable  Motion: Flexion 45 deg, Extension 45 deg, Side Bending to 45 deg bilaterally,  Rotation to 45 deg bilaterally  SLR laying: Negative  XSLR laying: Negative  Palpable tenderness: Midline muscles.Marland Kitchen FABER: negative. Sensory change: Gross sensation intact to all lumbar and sacral dermatomes.  Reflexes: 2+ at both patellar tendons, 2+ at achilles tendons, Babinski's downgoing.    Strength at foot  Plantar-flexion: 5/5 Dorsi-flexion: 5/5 Eversion: 5/5 Inversion: 5/5  Leg strength  Quad: 5/5 Hamstring: 5/5 Hip flexor: 5/5 Hip abductors: 5/5  Gait unremarkable.  Lumbar spine was reviewed and shows mild degenerative changes and Schmorl's nodes.  Impression and Recommendations:

## 2012-06-03 NOTE — Assessment & Plan Note (Signed)
Symptoms are likely discogenic, and she does have what sounds to be a left S1  radiculitis. We will start conservatively with prednisone, she will continue her Mobic, but we will increase to 15 mg daily. I'm adding a different muscle relaxer. Home exercises, tramadol for breakthrough pain. Return in 4 weeks, MRI for interventional injection planning if no better.

## 2012-06-03 NOTE — Assessment & Plan Note (Signed)
I've asked her to double her Flonase to 2 sprays in each nostril twice a day. This is ineffective, she can certainly try Qnasl. I have asked her to follow this up with her primary care provider.

## 2012-06-11 ENCOUNTER — Other Ambulatory Visit: Payer: Self-pay | Admitting: Physician Assistant

## 2012-07-10 ENCOUNTER — Other Ambulatory Visit: Payer: Self-pay | Admitting: Physician Assistant

## 2012-07-11 NOTE — Telephone Encounter (Signed)
Follow up with patient. Is she still taking norco regularly? I know she saw Dr. Karie Schwalbe at one point is the pain continuing?

## 2012-07-11 NOTE — Telephone Encounter (Signed)
Pain is continuing and has followup with Dr. Karie Schwalbe on 7/31 @ 4pm and says plan is to do MRI for possible injection planning.

## 2012-07-31 ENCOUNTER — Ambulatory Visit (INDEPENDENT_AMBULATORY_CARE_PROVIDER_SITE_OTHER): Payer: PRIVATE HEALTH INSURANCE | Admitting: Sports Medicine

## 2012-07-31 ENCOUNTER — Encounter: Payer: Self-pay | Admitting: Sports Medicine

## 2012-07-31 VITALS — BP 124/84 | HR 78 | Wt 140.0 lb

## 2012-07-31 DIAGNOSIS — M545 Low back pain, unspecified: Secondary | ICD-10-CM

## 2012-07-31 NOTE — Progress Notes (Signed)
  Subjective:    CC: Followup back pain  HPI: This is a very pleasant 32 year old female, she just got married. She has had back pain for some time now, it's localized in her left paralumbar muscles, worse with sitting in a car, flexion. She's been through therapy, steroids, NSAIDs, muscle relaxers without much improvement. X-rays have been negative. Symptoms are moderate, persistent.  Past medical history, Surgical history, Family history not pertinant except as noted below, Social history, Allergies, and medications have been entered into the medical record, reviewed, and no changes needed.   Review of Systems: No fevers, chills, night sweats, weight loss, chest pain, or shortness of breath.   Objective:    General: Well Developed, well nourished, and in no acute distress.  Neuro: Alert and oriented x3, extra-ocular muscles intact, sensation grossly intact.  HEENT: Normocephalic, atraumatic, pupils equal round reactive to light, neck supple, no masses, no lymphadenopathy, thyroid nonpalpable.  Skin: Warm and dry, no rashes. Cardiac: Regular rate and rhythm, no murmurs rubs or gallops, no lower extremity edema.  Respiratory: Clear to auscultation bilaterally. Not using accessory muscles, speaking in full sentences. Back Exam:  Inspection: Unremarkable  Motion: Flexion 45 deg, Extension 45 deg, Side Bending to 45 deg bilaterally,  Rotation to 45 deg bilaterally  SLR laying: Negative  XSLR laying: Negative  Palpable tenderness: Left PSIS. FABER: negative. Sensory change: Gross sensation intact to all lumbar and sacral dermatomes.  Reflexes: 2+ at both patellar tendons, 2+ at achilles tendons, Babinski's downgoing.  Strength at foot  Plantar-flexion: 5/5 Dorsi-flexion: 5/5 Eversion: 5/5 Inversion: 5/5  Leg strength  Quad: 5/5 Hamstring: 5/5 Hip flexor: 5/5 Hip abductors: 5/5  Gait unremarkable.  Impression and Recommendations:

## 2012-07-31 NOTE — Assessment & Plan Note (Signed)
Symptoms continue to sound predominantly discogenic. There is some pain over the left posterior superior iliac spine suggesting SI joint pathology. At this point I am going to obtain an MRI for interventional injection plan. Like to see Maevyn back to go for MRI results.

## 2012-08-04 ENCOUNTER — Encounter: Payer: Self-pay | Admitting: Sports Medicine

## 2012-08-04 ENCOUNTER — Telehealth: Payer: Self-pay

## 2012-08-04 ENCOUNTER — Other Ambulatory Visit: Payer: Self-pay

## 2012-08-04 MED ORDER — HYDROCODONE-ACETAMINOPHEN 5-325 MG PO TABS: ORAL_TABLET | ORAL | Status: DC

## 2012-08-04 NOTE — Telephone Encounter (Signed)
Called [facility] to get PA for MRI Lumbar Spine WO Contrast. Pending notification # [phone]. Waiting for Dari from Clinical to call back to approve.  Rhonda Cunningham,CMA

## 2012-08-07 ENCOUNTER — Encounter: Payer: Self-pay | Admitting: Sports Medicine

## 2012-08-11 ENCOUNTER — Ambulatory Visit
Admission: RE | Admit: 2012-08-11 | Discharge: 2012-08-11 | Disposition: A | Discharge status: Home / Self Care | Payer: PRIVATE HEALTH INSURANCE | Source: Ambulatory Visit | Attending: Sports Medicine | Admitting: Sports Medicine

## 2012-08-11 DIAGNOSIS — M545 Low back pain, unspecified: Secondary | ICD-10-CM

## 2012-08-12 ENCOUNTER — Encounter: Payer: Self-pay | Admitting: Sports Medicine

## 2012-08-28 ENCOUNTER — Encounter: Payer: Self-pay | Admitting: Sports Medicine

## 2012-08-28 ENCOUNTER — Ambulatory Visit (INDEPENDENT_AMBULATORY_CARE_PROVIDER_SITE_OTHER): Payer: PRIVATE HEALTH INSURANCE | Admitting: Sports Medicine

## 2012-08-28 VITALS — BP 121/80 | HR 93 | Wt 140.0 lb

## 2012-08-28 DIAGNOSIS — M7072 Other bursitis of hip, left hip: Secondary | ICD-10-CM

## 2012-08-28 DIAGNOSIS — M545 Low back pain, unspecified: Secondary | ICD-10-CM

## 2012-08-28 DIAGNOSIS — M76899 Other specified enthesopathies of unspecified lower limb, excluding foot: Secondary | ICD-10-CM

## 2012-08-28 DIAGNOSIS — M7062 Trochanteric bursitis, left hip: Secondary | ICD-10-CM | POA: Insufficient documentation

## 2012-08-28 NOTE — Progress Notes (Signed)
  Subjective:    CC: Follow up  HPI: Low back pain: Alicia Sanders has had pain she localizes in the mid lumbar spine, slightly to the left side, without radiation down the leg for a long time now. I have already placed her through formal physical therapy, steroids, NSAIDs, muscle relaxers, nerve blocking agents. Unfortunately she continues to have pain. More recently obtained an MRI, the results of which will be dictated below. There is moderate, persistent. Worse with flexion, worse with sitting in a car, and worse with Valsalva.  Left buttock pain: This is new and present for a couple of weeks, she localizes her pain over the left ischial tuberosity, worse when sitting down, creating a sharp pain over the tuberosity. She describes this is significantly different than the initial back pain. No trauma.  Past medical history, Surgical history, Family history not pertinant except as noted below, Social history, Allergies, and medications have been entered into the medical record, reviewed, and no changes needed.   Review of Systems: No fevers, chills, night sweats, weight loss, chest pain, or shortness of breath.   Objective:    General: Well Developed, well nourished, and in no acute distress.  Neuro: Alert and oriented x3, extra-ocular muscles intact, sensation grossly intact.  HEENT: Normocephalic, atraumatic, pupils equal round reactive to light, neck supple, no masses, no lymphadenopathy, thyroid nonpalpable.  Skin: Warm and dry, no rashes. Cardiac: Regular rate and rhythm, no murmurs rubs or gallops, no lower extremity edema.  Respiratory: Clear to auscultation bilaterally. Not using accessory muscles, speaking in full sentences. Left Hip: ROM IR: 45 Deg, ER: 45 Deg, Flexion: 120 Deg, Extension: 100 Deg, Abduction: 45 Deg, Adduction: 45 Deg Strength IR: 5/5, ER: 5/5, Flexion: 5/5, Extension: 5/5, Abduction: 5/5, Adduction: 5/5 Pelvic alignment unremarkable to inspection and  palpation. Standing hip rotation and gait without trendelenburg sign / unsteadiness. Greater trochanter without tenderness to palpation. No tenderness over piriformis and greater trochanter. No pain with FABER or FADIR. No SI joint tenderness and normal minimal SI movement. Exquisite tenderness to palpation over the ischial tuberosity, pain with resisted hip extension on the left side.  MRI was reviewed and shows degenerative disc disease and desiccation mildly at the T11-T12 level, and worst at the L2-L3 level. There is no foraminal or central canal stenosis.  Procedure:  Injection of left ischial bursa Consent obtained and verified. Time-out conducted. Noted no overlying erythema, induration, or other signs of local infection. Skin prepped in a sterile fashion. Topical analgesic spray: Ethyl chloride. Completed without difficulty. Meds: Spinal needle advanced to the ischial tuberosity, 1 cc Kenalog 40, 3 cc lidocaine injected easily into the ischial bursa. Pain immediately improved suggesting accurate placement of the medication. Advised to call if fevers/chills, erythema, induration, drainage, or persistent bleeding.  Impression and Recommendations:

## 2012-08-28 NOTE — Assessment & Plan Note (Addendum)
MRI does show surprising L2-L3 degenerative disc disease. She has failed physical therapy, steroids, muscle relaxers, NSAIDs. Pain is clinically discogenic. At this point we are going to proceed with an L2-L3 interlaminar epidural steroid injection from the left side. I would like to see her back one week after the injection to see how things are going.

## 2012-08-28 NOTE — Assessment & Plan Note (Addendum)
Injection into the ischial bursa as above. Home exercises. Return as needed for this.

## 2012-09-02 ENCOUNTER — Other Ambulatory Visit: Payer: Self-pay | Admitting: Sports Medicine

## 2012-09-02 MED ORDER — HYDROCODONE-ACETAMINOPHEN 5-325 MG PO TABS: ORAL_TABLET | ORAL | Status: DC

## 2012-09-02 NOTE — Telephone Encounter (Signed)
Faxed in

## 2012-09-25 ENCOUNTER — Ambulatory Visit
Admission: RE | Admit: 2012-09-25 | Discharge: 2012-09-25 | Disposition: A | Discharge status: Home / Self Care | Payer: PRIVATE HEALTH INSURANCE | Source: Ambulatory Visit | Attending: Sports Medicine | Admitting: Sports Medicine

## 2012-09-25 MED ORDER — IOHEXOL 180 MG/ML  SOLN
1.0000 mL | Freq: Once | INTRAMUSCULAR | Status: AC | PRN
  Administered 2012-09-25: 1 mL via EPIDURAL

## 2012-09-25 MED ORDER — METHYLPREDNISOLONE ACETATE 40 MG/ML INJ SUSP (RADIOLOG
120.0000 mg | Freq: Once | INTRAMUSCULAR | Status: AC
  Administered 2012-09-25: 120 mg via EPIDURAL

## 2012-10-28 ENCOUNTER — Other Ambulatory Visit: Payer: Self-pay | Admitting: Sports Medicine

## 2012-10-28 ENCOUNTER — Other Ambulatory Visit: Payer: Self-pay

## 2012-11-06 ENCOUNTER — Other Ambulatory Visit: Payer: Self-pay

## 2013-01-08 ENCOUNTER — Encounter: Payer: Self-pay | Admitting: Physician Assistant

## 2013-01-08 ENCOUNTER — Other Ambulatory Visit: Payer: Self-pay | Admitting: Physician Assistant

## 2013-01-08 DIAGNOSIS — E039 Hypothyroidism, unspecified: Secondary | ICD-10-CM

## 2013-01-09 ENCOUNTER — Other Ambulatory Visit: Payer: Self-pay | Admitting: Physician Assistant

## 2013-01-09 DIAGNOSIS — E039 Hypothyroidism, unspecified: Secondary | ICD-10-CM

## 2013-01-14 ENCOUNTER — Telehealth: Payer: Self-pay | Admitting: *Deleted

## 2013-01-14 NOTE — Telephone Encounter (Signed)
Pt husband has flu & want to know if she can have tamiflu.

## 2013-01-14 NOTE — Telephone Encounter (Signed)
Okay for Tamiflu 75 mg 1 by mouth daily for 10 days.

## 2013-01-14 NOTE — Telephone Encounter (Signed)
Sent!

## 2013-01-15 ENCOUNTER — Telehealth: Payer: Self-pay

## 2013-01-15 ENCOUNTER — Telehealth: Payer: Self-pay | Admitting: Family Medicine

## 2013-01-15 LAB — T4, FREE: Free T4: 1.3 ng/dL (ref 0.80–1.80)

## 2013-01-15 LAB — TSH: TSH: 2.535 u[IU]/mL (ref 0.350–4.500)

## 2013-01-15 MED ORDER — LEVOTHYROXINE SODIUM 50 MCG PO TABS: 50.0000 ug | ORAL_TABLET | Freq: Every day | ORAL | Status: DC

## 2013-01-15 MED ORDER — OSELTAMIVIR PHOSPHATE 75 MG PO CAPS: 75.0000 mg | ORAL_CAPSULE | Freq: Two times a day (BID) | ORAL | Status: DC

## 2013-01-15 NOTE — Telephone Encounter (Signed)
Patient called stated that her husband was recently diagnosed with the flu she wants to know if you can call her in some Tamiflu to her pharmacy. Rhonda Cunningham,CMA

## 2013-01-15 NOTE — Telephone Encounter (Signed)
Amber,  Will you please let Mrs. Roseanne RenoStewart know that her thyroid function appears perfect, I'd encourage continuing on her levothyroxine 50 mcg supplementation which I've [facility] on [facility].

## 2013-01-15 NOTE — Telephone Encounter (Signed)
I actually had already sent it before I left yesterday evening.

## 2013-01-15 NOTE — Telephone Encounter (Signed)
[employer]

## 2013-01-15 NOTE — Telephone Encounter (Signed)
Left results on vm. 

## 2013-01-15 NOTE — Telephone Encounter (Signed)
Amber, Will you please let patient know that I've sent an Rx for tamiflu to her [facility] on file.

## 2013-05-05 ENCOUNTER — Other Ambulatory Visit: Payer: Self-pay | Admitting: Sports Medicine

## 2013-05-05 ENCOUNTER — Other Ambulatory Visit: Payer: Self-pay

## 2013-05-05 MED ORDER — TRAMADOL HCL 50 MG PO TABS: 50.0000 mg | ORAL_TABLET | Freq: Three times a day (TID) | ORAL | Status: DC | PRN

## 2013-05-11 ENCOUNTER — Telehealth: Payer: Self-pay

## 2013-05-11 NOTE — Telephone Encounter (Signed)
Patient called stated that she think she hurt her back last week and  the Flexril is not working she is requesting a RX for something stronger patient was last seen for back pain 08/2012 she was transferred up front to schedule appt to be seen.Bjorn Loser. Rhonda Cunningham,CMA

## 2013-05-12 ENCOUNTER — Ambulatory Visit (INDEPENDENT_AMBULATORY_CARE_PROVIDER_SITE_OTHER): Payer: PRIVATE HEALTH INSURANCE | Admitting: Sports Medicine

## 2013-05-12 ENCOUNTER — Encounter: Payer: Self-pay | Admitting: Sports Medicine

## 2013-05-12 VITALS — BP 120/85 | HR 94 | Ht 65.0 in | Wt 151.0 lb

## 2013-05-12 DIAGNOSIS — M5136 Other intervertebral disc degeneration, lumbar region: Secondary | ICD-10-CM

## 2013-05-12 DIAGNOSIS — M51379 Other intervertebral disc degeneration, lumbosacral region without mention of lumbar back pain or lower extremity pain: Secondary | ICD-10-CM

## 2013-05-12 DIAGNOSIS — M51369 Other intervertebral disc degeneration, lumbar region without mention of lumbar back pain or lower extremity pain: Secondary | ICD-10-CM

## 2013-05-12 DIAGNOSIS — M7072 Other bursitis of hip, left hip: Secondary | ICD-10-CM

## 2013-05-12 DIAGNOSIS — M76899 Other specified enthesopathies of unspecified lower limb, excluding foot: Secondary | ICD-10-CM

## 2013-05-12 DIAGNOSIS — M5137 Other intervertebral disc degeneration, lumbosacral region: Secondary | ICD-10-CM

## 2013-05-12 MED ORDER — CYCLOBENZAPRINE HCL 10 MG PO TABS: ORAL_TABLET | ORAL | Status: DC

## 2013-05-12 MED ORDER — PREDNISONE 50 MG PO TABS: ORAL_TABLET | ORAL | Status: DC

## 2013-05-12 NOTE — Assessment & Plan Note (Signed)
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 She did have an epidural last year at the L2-L3 level which provided fantastic relief of her axial pain. Today pain is referral to the left S1 nerve root, this suggests a new disc herniation. Today we are going to proceed with conservative measures in the form of prednisone, Flexeril, home rehabilitation. If no better at that point would likely need to get a new MRI do suspect the lower lumbar disc protrusion, this would be in anticipation of a repeat epidural.

## 2013-05-12 NOTE — Progress Notes (Signed)
  Subjective:    CC: Left buttock pain  HPI: Alicia Sanders is a very pleasant 33 year old female, she was an Dentistx-ray technician downstairs, approximately a year ago she had some back pain, failed all conservative measures, MRI subsequently showed L2-L3 disc protrusion. She went through a lumbar epidural, and this resolved her symptoms for a solid year. She also had a left ischial bursitis that responded a year ago to ischial bursa injection without guidance, with a one-year response nearly.   More recently she had a very long shift, and now has a significant pain both in the left ischial tuberosity as well as radiating down the left leg in an S1 distribution causing numbness and tingling in her pinky toe on the left side. Pain is moderate, persistent, worse with flexion and Valsalva.  Past medical history, Surgical history, Family history not pertinant except as noted below, Social history, Allergies, and medications have been entered into the medical record, reviewed, and no changes needed.   Review of Systems: No fevers, chills, night sweats, weight loss, chest pain, or shortness of breath.   Objective:    General: Well Developed, well nourished, and in no acute distress.  Neuro: Alert and oriented x3, extra-ocular muscles intact, sensation grossly intact.  HEENT: Normocephalic, atraumatic, pupils equal round reactive to light, neck supple, no masses, no lymphadenopathy, thyroid nonpalpable.  Skin: Warm and dry, no rashes. Cardiac: Regular rate and rhythm, no murmurs rubs or gallops, no lower extremity edema.  Respiratory: Clear to auscultation bilaterally. Not using accessory muscles, speaking in full sentences. Left buttock: Exquisite tenderness to palpation of the ischial tuberosity. Back Exam:  Inspection: Unremarkable  Motion: Flexion 45 deg, Extension 45 deg, Side Bending to 45 deg bilaterally,  Rotation to 45 deg bilaterally  SLR laying: Positive with reproduction of left sided radicular  symptoms in an S1 distribution.  XSLR laying: Negative  Palpable tenderness: None. FABER: negative. Sensory change: Gross sensation intact to all lumbar and sacral dermatomes.  Reflexes: 2+ at both patellar tendons, 2+ at achilles tendons, Babinski's downgoing.  Strength at foot  Plantar-flexion: 5/5 Dorsi-flexion: 5/5 Eversion: 5/5 Inversion: 5/5  Leg strength  Quad: 5/5 Hamstring: 5/5 Hip flexor: 5/5 Hip abductors: 5/5  Gait unremarkable.  Procedure:  Injection of left ischial tuberosity Consent obtained and verified. Time-out conducted. Noted no overlying erythema, induration, or other signs of local infection. Skin prepped in a sterile fashion. Topical analgesic spray: Ethyl chloride. Completed without difficulty. Meds: Spinal needle advanced to the left ischial tuberosity, a total of 1 cc Kenalog 40, 4 cc lidocaine injected easily. Pain immediately improved suggesting accurate placement of the medication. Advised to call if fevers/chills, erythema, induration, drainage, or persistent bleeding.  Impression and Recommendations:

## 2013-05-12 NOTE — Assessment & Plan Note (Signed)
Repeat left ischial bursa injection, the last injection provided 10 months of response.

## 2013-05-28 ENCOUNTER — Encounter: Payer: Self-pay | Admitting: Physician Assistant

## 2013-06-04 ENCOUNTER — Encounter: Payer: Self-pay | Admitting: Physician Assistant

## 2013-06-04 ENCOUNTER — Other Ambulatory Visit: Payer: Self-pay | Admitting: Sports Medicine

## 2013-06-04 DIAGNOSIS — M5416 Radiculopathy, lumbar region: Secondary | ICD-10-CM

## 2013-06-08 ENCOUNTER — Telehealth: Payer: Self-pay | Admitting: *Deleted

## 2013-06-08 NOTE — Telephone Encounter (Signed)
PA obtained for MRI Lumbar w/o contrast. Auth # [id number].   Meyer Cory, LPN

## 2013-06-09 ENCOUNTER — Other Ambulatory Visit: Payer: Self-pay

## 2013-06-09 ENCOUNTER — Other Ambulatory Visit: Payer: Self-pay | Admitting: Sports Medicine

## 2013-06-09 MED ORDER — TRAMADOL HCL 50 MG PO TABS: 50.0000 mg | ORAL_TABLET | Freq: Three times a day (TID) | ORAL | Status: DC | PRN

## 2013-06-10 ENCOUNTER — Ambulatory Visit: Payer: PRIVATE HEALTH INSURANCE | Admitting: Sports Medicine

## 2013-06-22 ENCOUNTER — Inpatient Hospital Stay
Admission: RE | Admit: 2013-06-22 | Discharge: 2013-06-22 | Disposition: A | Discharge status: Home / Self Care | Payer: PRIVATE HEALTH INSURANCE | Source: Ambulatory Visit | Attending: Sports Medicine | Admitting: Sports Medicine

## 2013-07-10 ENCOUNTER — Ambulatory Visit
Admission: RE | Admit: 2013-07-10 | Discharge: 2013-07-10 | Disposition: A | Discharge status: Home / Self Care | Payer: PRIVATE HEALTH INSURANCE | Source: Ambulatory Visit | Attending: Sports Medicine | Admitting: Sports Medicine

## 2013-07-10 DIAGNOSIS — M5416 Radiculopathy, lumbar region: Secondary | ICD-10-CM

## 2013-11-02 ENCOUNTER — Other Ambulatory Visit: Payer: Self-pay | Admitting: Sports Medicine

## 2013-11-30 ENCOUNTER — Other Ambulatory Visit: Payer: Self-pay | Admitting: *Deleted

## 2013-11-30 DIAGNOSIS — M5136 Other intervertebral disc degeneration, lumbar region: Secondary | ICD-10-CM

## 2013-11-30 MED ORDER — PREDNISONE 50 MG PO TABS: ORAL_TABLET | ORAL | Status: DC

## 2013-11-30 MED ORDER — TRAMADOL HCL 50 MG PO TABS: ORAL_TABLET | ORAL | Status: DC

## 2014-01-04 ENCOUNTER — Other Ambulatory Visit: Payer: Self-pay | Admitting: *Deleted

## 2014-01-04 ENCOUNTER — Other Ambulatory Visit: Payer: Self-pay | Admitting: Sports Medicine

## 2014-01-04 MED ORDER — TRAMADOL HCL 50 MG PO TABS: ORAL_TABLET | ORAL | Status: DC

## 2014-01-04 MED ORDER — TRAMADOL HCL 50 MG PO TABS: 50.0000 mg | ORAL_TABLET | Freq: Three times a day (TID) | ORAL | Status: DC | PRN

## 2014-02-02 ENCOUNTER — Other Ambulatory Visit: Payer: Self-pay

## 2014-02-02 MED ORDER — TRAMADOL HCL 50 MG PO TABS: 50.0000 mg | ORAL_TABLET | Freq: Three times a day (TID) | ORAL | Status: DC | PRN

## 2014-03-05 ENCOUNTER — Other Ambulatory Visit: Payer: Self-pay | Admitting: *Deleted

## 2014-03-05 MED ORDER — TRAMADOL HCL 50 MG PO TABS: 50.0000 mg | ORAL_TABLET | Freq: Three times a day (TID) | ORAL | Status: DC | PRN

## 2014-04-21 ENCOUNTER — Other Ambulatory Visit: Payer: Self-pay | Admitting: Physician Assistant

## 2014-05-24 ENCOUNTER — Other Ambulatory Visit: Payer: Self-pay | Admitting: Sports Medicine

## 2014-06-07 ENCOUNTER — Ambulatory Visit: Payer: Self-pay

## 2014-06-07 ENCOUNTER — Ambulatory Visit (INDEPENDENT_AMBULATORY_CARE_PROVIDER_SITE_OTHER): Payer: PRIVATE HEALTH INSURANCE | Admitting: Podiatry

## 2014-06-07 ENCOUNTER — Encounter: Payer: Self-pay | Admitting: Podiatry

## 2014-06-07 ENCOUNTER — Ambulatory Visit (INDEPENDENT_AMBULATORY_CARE_PROVIDER_SITE_OTHER): Payer: PRIVATE HEALTH INSURANCE

## 2014-06-07 VITALS — BP 112/79 | HR 80 | Resp 11 | Ht 66.0 in | Wt 150.0 lb

## 2014-06-07 DIAGNOSIS — M722 Plantar fascial fibromatosis: Secondary | ICD-10-CM | POA: Diagnosis not present

## 2014-06-07 MED ORDER — DICLOFENAC SODIUM 75 MG PO TBEC: 75.0000 mg | DELAYED_RELEASE_TABLET | Freq: Two times a day (BID) | ORAL | Status: DC

## 2014-06-07 MED ORDER — TRIAMCINOLONE ACETONIDE 10 MG/ML IJ SUSP
10.0000 mg | Freq: Once | INTRAMUSCULAR | Status: AC
  Administered 2014-06-07: 10 mg

## 2014-06-07 NOTE — Patient Instructions (Signed)

## 2014-06-07 NOTE — Progress Notes (Signed)
   Subjective:    Patient ID: Alicia Sanders, female    DOB: 28-Mar-1980, 34 y.o.   MRN: [id number]  HPI    Review of Systems  All other systems reviewed and are negative.      Objective:   Physical Exam        Assessment & Plan:

## 2014-06-09 NOTE — Progress Notes (Signed)
Subjective:     Patient ID: Alicia Sanders, female   DOB: [birth year], 34 y.o.   MRN: [id number]  HPI patient presents with pain in the right heel at the insertional point of the tendon into the calcaneus with fluid buildup of several months duration   Review of Systems  All other systems reviewed and are negative.      Objective:   Physical Exam  Constitutional: She is oriented to person, place, and time.  Cardiovascular: Intact distal pulses.   Musculoskeletal: Normal range of motion.  Neurological: She is oriented to person, place, and time.  Skin: Skin is warm.  Nursing note and vitals reviewed.  neurovascular status intact muscle strength adequate with range of motion subtalar midtarsal joint within normal limits. Patient's noted to have significant depression of the arch bilateral with equinus condition and has exquisite discomfort plantar aspect right heel at the insertional point of the tendon into the calcaneus. Patient has good digital perfusion and is well oriented 3     Assessment:     Fasciitis right acute in nature with depression of the arch noted    Plan:     H&P and x-rays reviewed with patient. Injected the right plantar fascia 3 mg Kenalog 5 mill grams Xylocaine and applied fascial brace with instructions and gave instructions on physical therapy. Discussed long-term orthotics and reappoint for visit in 1 week

## 2014-06-14 ENCOUNTER — Encounter: Payer: Self-pay | Admitting: Podiatry

## 2014-06-14 ENCOUNTER — Ambulatory Visit (INDEPENDENT_AMBULATORY_CARE_PROVIDER_SITE_OTHER): Payer: PRIVATE HEALTH INSURANCE | Admitting: Podiatry

## 2014-06-14 VITALS — BP 123/73 | HR 70 | Resp 12

## 2014-06-14 DIAGNOSIS — M722 Plantar fascial fibromatosis: Secondary | ICD-10-CM

## 2014-06-15 NOTE — Progress Notes (Signed)
Subjective:     Patient ID: Alicia Sanders, female   DOB: 03-16-1980, 34 y.o.   MRN: [id number]  HPI patient presents stating I'm still getting pain in my heel but I have had some improvement from the previous treatment   Review of Systems     Objective:   Physical Exam Neurovascular status intact muscle strength adequate with diminishment of discomfort in the plantar aspect right heel with pain still present and quite a bit of depression of the arch upon weightbearing and gait evaluation    Assessment:     Plantar fasciitis still present with mild improvement with significant change in the function of the arch    Plan:     H&P and condition reviewed. Discussed continued physical therapy and at this time scanned for custom orthotic devices. Reappoint when returned and may require further treatment depending on how she is doing

## 2014-07-27 ENCOUNTER — Telehealth: Payer: Self-pay | Admitting: *Deleted

## 2014-07-27 NOTE — Telephone Encounter (Addendum)
Pt states she has been treated for plantar fasciitis, with injections, but continues to have pain worsening over 2 days, with pain in heels and sides of her legs.  Left message to call for concerns.  Pt states she has swelling in the arch and pain on the outside of her leg.  I transferred pt to get an appt, since her symptoms had changed.

## 2014-07-28 ENCOUNTER — Telehealth: Payer: Self-pay | Admitting: *Deleted

## 2014-07-28 NOTE — Telephone Encounter (Signed)
Pt request pain medication until she is seen in office 07/29/2014.  Pt was prescribed #270 Tramadol on 05/24/2014.  I encouraged pt to take the Tramadol for the pain and ice 3 to 4 time/day 10-15 minutes each.

## 2014-07-29 ENCOUNTER — Encounter: Payer: Self-pay | Admitting: Podiatry

## 2014-07-29 ENCOUNTER — Ambulatory Visit (INDEPENDENT_AMBULATORY_CARE_PROVIDER_SITE_OTHER): Payer: PRIVATE HEALTH INSURANCE | Admitting: Podiatry

## 2014-07-29 VITALS — BP 119/75 | HR 80 | Resp 12

## 2014-07-29 DIAGNOSIS — S93601A Unspecified sprain of right foot, initial encounter: Secondary | ICD-10-CM | POA: Diagnosis not present

## 2014-07-29 DIAGNOSIS — M722 Plantar fascial fibromatosis: Secondary | ICD-10-CM

## 2014-07-29 NOTE — Progress Notes (Signed)
Subjective:     Patient ID: Alicia Sanders, female   DOB: Dec 04, 1980, 34 y.o.   MRN: [id number]  HPIThis patient presents to office with painful right heel.  She states started on Monday and since then the outside top of her right foot became painful.  She says she has difficulty walking.  She was previously treated with Voltaren and injection therapy.  She says she had difficulty taking Voltaren.  She says the pain has worsened again and she presents for evaluation and treatment.   Review of Systems     Objective:   Physical Exam GENERAL APPEARANCE: Alert, conversant. Appropriately groomed. No acute distress.  VASCULAR: Pedal pulses palpable at 2/4 DP and PT bilateral.  Capillary refill time is immediate to all digits,  Proximal to distal cooling it warm to warm.  Digital hair growth is present bilateral  NEUROLOGIC: sensation is intact epicritically and protectively to 5.07 monofilament at 5/5 sites bilateral.  Light touch is intact bilateral, vibratory sensation intact bilateral, achilles tendon reflex is intact bilateral.  MUSCULOSKELETAL: acceptable muscle strength, tone and stability bilateral.  Intrinsic muscluature intact bilateral.  Rectus appearance of foot and digits noted bilateral. Palpable pain at insertion plantar fascia right foot.  Palpable pain at the fifth metabase-cuboid joint right foot.  No significant swelling noted.  DERMATOLOGIC: skin color, texture, and turgor are within normal limits.  No preulcerative lesions or ulcers  are seen, no interdigital maceration noted.  No open lesions present.  Digital nails are asymptomatic. No drainage noted.      Assessment:     Plantar Fasciitis right foot  Foot Sprain right foot.     Plan:     ROV.  Injection therapy.  Powersteps dispensed  Night Splint was dispensed.  RTC 2 weeks.

## 2014-08-02 ENCOUNTER — Telehealth: Payer: Self-pay | Admitting: *Deleted

## 2014-08-02 NOTE — Telephone Encounter (Addendum)
Pt states the injection helped her heel pain, but she is still having severe lateral foot pain, even after icing, antiinflammatory medication, Tramadol and the night splint.  Pt asked if she should be seen again.  I left a message telling pt I would inform Dr. Stacie Acres of her problem and see if an Air Fracture walker would help.  Pt called again, stating a co-worker has an [employer] fracture walker that she can borrow and she will begin wearing it tomorrow.  Left message instructing pt to begin the AFW.  Dr. Stacie Acres states have pt continue the AFW until seen in office.

## 2014-08-20 ENCOUNTER — Other Ambulatory Visit: Payer: Self-pay | Admitting: Sports Medicine

## 2014-09-21 ENCOUNTER — Other Ambulatory Visit: Payer: Self-pay | Admitting: Orthopedic Surgery

## 2014-09-21 DIAGNOSIS — M79671 Pain in right foot: Secondary | ICD-10-CM

## 2014-09-22 ENCOUNTER — Other Ambulatory Visit: Payer: PRIVATE HEALTH INSURANCE

## 2014-09-24 ENCOUNTER — Ambulatory Visit
Admission: RE | Admit: 2014-09-24 | Discharge: 2014-09-24 | Disposition: A | Discharge status: Home / Self Care | Payer: PRIVATE HEALTH INSURANCE | Source: Ambulatory Visit | Attending: Orthopedic Surgery | Admitting: Orthopedic Surgery

## 2014-09-24 DIAGNOSIS — M79671 Pain in right foot: Secondary | ICD-10-CM

## 2014-11-12 ENCOUNTER — Other Ambulatory Visit: Payer: Self-pay | Admitting: Sports Medicine

## 2015-02-03 ENCOUNTER — Other Ambulatory Visit: Payer: Self-pay | Admitting: Sports Medicine

## 2015-04-18 ENCOUNTER — Encounter: Payer: Self-pay | Admitting: Sports Medicine

## 2015-04-18 ENCOUNTER — Ambulatory Visit (INDEPENDENT_AMBULATORY_CARE_PROVIDER_SITE_OTHER): Payer: PRIVATE HEALTH INSURANCE | Admitting: Sports Medicine

## 2015-04-18 VITALS — BP 135/83 | HR 94 | Resp 18 | Ht 65.0 in | Wt 163.1 lb

## 2015-04-18 DIAGNOSIS — M7062 Trochanteric bursitis, left hip: Secondary | ICD-10-CM

## 2015-04-18 DIAGNOSIS — R635 Abnormal weight gain: Secondary | ICD-10-CM

## 2015-04-18 DIAGNOSIS — M722 Plantar fascial fibromatosis: Secondary | ICD-10-CM

## 2015-04-18 HISTORY — DX: Abnormal weight gain: R63.5

## 2015-04-18 MED ORDER — PHENTERMINE HCL 37.5 MG PO TABS: ORAL_TABLET | ORAL | Status: DC

## 2015-04-18 NOTE — Assessment & Plan Note (Signed)
At this point has failed 3 steroid injections, over-the-counter orthotics, casting by orthopedic foot surgery, as well as a plantar fascia type ankle brace. She continues to do her stretches every day. At this point we are going to proceed with custom molded orthotics, and if failure of this after 2 weeks I am going to do a PRP procedure. Return to see me for orthotics.

## 2015-04-18 NOTE — Assessment & Plan Note (Signed)
Ultrasound guided injection as above, rehabilitation exercises, return in 4 weeks for this.

## 2015-04-18 NOTE — Progress Notes (Signed)
   Subjective:    I'm seeing this patient as a consultation for:  Tandy GawJade Breeback, PA-C  CC: left hip pain  HPI: For the past several weeks this pleasant 35 year old female has had pain that she localizes on the lateral aspect of her left hip with radiation to the mid thigh, moderate, persistent, burning in quality. Worse with laying on the ipsilateral side.  Plantar fasciitis: Has seen podiatry, foot and ankle surgery, has had an MRI today confirms plantar fasciitis. Has had 3 steroid injections, is currently doing home physical therapy, has not yet had custom orthotics. Pain is moderate, persistent and localized at the calcaneal insertion of plantar fascia.  Abnormal weight gain: Would like some help losing weight.  Past medical history, Surgical history, Family history not pertinant except as noted below, Social history, Allergies, and medications have been entered into the medical record, reviewed, and no changes needed.   Review of Systems: No headache, visual changes, nausea, vomiting, diarrhea, constipation, dizziness, abdominal pain, skin rash, fevers, chills, night sweats, weight loss, swollen lymph nodes, body aches, joint swelling, muscle aches, chest pain, shortness of breath, mood changes, visual or auditory hallucinations.   Objective:   General: Well Developed, well nourished, and in no acute distress.  Neuro/Psych: Alert and oriented x3, extra-ocular muscles intact, able to move all 4 extremities, sensation grossly intact. Skin: Warm and dry, no rashes noted.  Respiratory: Not using accessory muscles, speaking in full sentences, trachea midline.  Cardiovascular: Pulses palpable, no extremity edema. Abdomen: Does not appear distended. Left Hip: ROM IR: 60 Deg, ER: 60 Deg, Flexion: 120 Deg, Extension: 100 Deg, Abduction: 45 Deg, Adduction: 45 Deg Strength IR: 5/5, ER: 5/5, Flexion: 5/5, Extension: 5/5, Abduction: 5/5, Adduction: 5/5 Pelvic alignment unremarkable to  inspection and palpation. Standing hip rotation and gait without trendelenburg / unsteadiness. Greater trochanter with tenderness to palpation. No tenderness over piriformis. No SI joint tenderness and normal minimal SI movement.  Procedure: Real-time Ultrasound Guided Injection of Left greater trochanteric bursa Device: GE Logiq E  Verbal informed consent obtained.  Time-out conducted.  Noted no overlying erythema, induration, or other signs of local infection.  Skin prepped in a sterile fashion.  Local anesthesia: Topical Ethyl chloride.  With sterile technique and under real time ultrasound guidance: spinal needle advanced through the gluteus medius into the bursa, 1 mL kenalog 40, 2 mL lidocaine, 2 mL Marcaine injected easily  Completed without difficulty  Pain immediately resolved suggesting accurate placement of the medication.  Advised to call if fevers/chills, erythema, induration, drainage, or persistent bleeding.  Images permanently stored and available for review in the ultrasound unit.  Impression: Technically successful ultrasound guided injection.  Impression and Recommendations:   This case required medical decision making of moderate complexity.

## 2015-04-18 NOTE — Assessment & Plan Note (Signed)
Starting phentermine. BMI over 27 with plantar fasciitis.

## 2015-04-28 ENCOUNTER — Other Ambulatory Visit: Payer: Self-pay | Admitting: Sports Medicine

## 2015-05-03 ENCOUNTER — Ambulatory Visit (INDEPENDENT_AMBULATORY_CARE_PROVIDER_SITE_OTHER): Payer: PRIVATE HEALTH INSURANCE | Admitting: Sports Medicine

## 2015-05-03 ENCOUNTER — Encounter: Payer: Self-pay | Admitting: Sports Medicine

## 2015-05-03 VITALS — BP 128/86 | HR 85 | Resp 18 | Wt 153.4 lb

## 2015-05-03 DIAGNOSIS — M722 Plantar fascial fibromatosis: Secondary | ICD-10-CM

## 2015-05-03 NOTE — Progress Notes (Signed)

## 2015-05-03 NOTE — Assessment & Plan Note (Signed)
Custom orthotics as above, rehabilitation exercises , she has failed 3 steroid injections, as well as casting.  f no significant improvement in 2 weeks we will proceed with PRP.

## 2015-05-10 ENCOUNTER — Ambulatory Visit: Payer: PRIVATE HEALTH INSURANCE | Admitting: Sports Medicine

## 2015-05-10 ENCOUNTER — Ambulatory Visit (INDEPENDENT_AMBULATORY_CARE_PROVIDER_SITE_OTHER): Payer: PRIVATE HEALTH INSURANCE | Admitting: Sports Medicine

## 2015-05-10 VITALS — BP 143/93 | HR 106 | Ht 65.0 in | Wt 151.0 lb

## 2015-05-10 DIAGNOSIS — R635 Abnormal weight gain: Secondary | ICD-10-CM | POA: Diagnosis not present

## 2015-05-10 MED ORDER — PHENTERMINE HCL 37.5 MG PO TABS: ORAL_TABLET | ORAL | Status: DC

## 2015-05-10 NOTE — Progress Notes (Signed)
Patient was in office for weight and blood pressure check, she denies any side effects. Patient will follow up in 1 month for weight check.Shamon Cothran,CMA

## 2015-06-07 ENCOUNTER — Ambulatory Visit (INDEPENDENT_AMBULATORY_CARE_PROVIDER_SITE_OTHER): Payer: PRIVATE HEALTH INSURANCE | Admitting: Sports Medicine

## 2015-06-07 ENCOUNTER — Encounter: Payer: Self-pay | Admitting: Sports Medicine

## 2015-06-07 VITALS — BP 131/76 | HR 111 | Resp 16 | Wt 146.9 lb

## 2015-06-07 DIAGNOSIS — M722 Plantar fascial fibromatosis: Secondary | ICD-10-CM | POA: Diagnosis not present

## 2015-06-07 DIAGNOSIS — R635 Abnormal weight gain: Secondary | ICD-10-CM | POA: Diagnosis not present

## 2015-06-07 MED ORDER — PHENTERMINE HCL 37.5 MG PO TABS: ORAL_TABLET | ORAL | Status: DC

## 2015-06-07 NOTE — Assessment & Plan Note (Signed)
Good continued weight loss as we enter the third month of phentermine.

## 2015-06-07 NOTE — Progress Notes (Signed)
  Subjective:    CC: Weight check  HPI: Doing well after 2 months of phentermine.  Plantar fasciitis: Needs to plane down her orthotics a bit, her shoes were a bit tight at the toes.  Past medical history, Surgical history, Family history not pertinant except as noted below, Social history, Allergies, and medications have been entered into the medical record, reviewed, and no changes needed.   Review of Systems: No fevers, chills, night sweats, weight loss, chest pain, or shortness of breath.   Objective:    General: Well Developed, well nourished, and in no acute distress.  Neuro: Alert and oriented x3, extra-ocular muscles intact, sensation grossly intact.  HEENT: Normocephalic, atraumatic, pupils equal round reactive to light, neck supple, no masses, no lymphadenopathy, thyroid nonpalpable.  Skin: Warm and dry, no rashes. Cardiac: Regular rate and rhythm, no murmurs rubs or gallops, no lower extremity edema.  Respiratory: Clear to auscultation bilaterally. Not using accessory muscles, speaking in full sentences.  Impression and Recommendations:   I spent 25 minutes with this patient, greater than 50% was face-to-face time counseling regarding the above diagnoses

## 2015-06-07 NOTE — Assessment & Plan Note (Signed)
Doing well, orthotics were planed down a bit at the toe box

## 2015-07-06 ENCOUNTER — Ambulatory Visit (INDEPENDENT_AMBULATORY_CARE_PROVIDER_SITE_OTHER): Payer: PRIVATE HEALTH INSURANCE | Admitting: Sports Medicine

## 2015-07-06 ENCOUNTER — Encounter: Payer: Self-pay | Admitting: Sports Medicine

## 2015-07-06 DIAGNOSIS — R635 Abnormal weight gain: Secondary | ICD-10-CM

## 2015-07-06 MED ORDER — TOPIRAMATE 50 MG PO TABS: ORAL_TABLET | ORAL | Status: DC

## 2015-07-06 MED ORDER — PHENTERMINE HCL 37.5 MG PO TABS: ORAL_TABLET | ORAL | Status: DC

## 2015-07-06 NOTE — Progress Notes (Signed)
  Subjective:    CC: Weight check  HPI: Finished 3 months of phentermine, has plateaued after the last month. Having some constipation and palpitations.  Past medical history, Surgical history, Family history not pertinant except as noted below, Social history, Allergies, and medications have been entered into the medical record, reviewed, and no changes needed.   Review of Systems: No fevers, chills, night sweats, weight loss, chest pain, or shortness of breath.   Objective:    General: Well Developed, well nourished, and in no acute distress.  Neuro: Alert and oriented x3, extra-ocular muscles intact, sensation grossly intact.  HEENT: Normocephalic, atraumatic, pupils equal round reactive to light, neck supple, no masses, no lymphadenopathy, thyroid nonpalpable.  Skin: Warm and dry, no rashes. Cardiac: Regular rate and rhythm, no murmurs rubs or gallops, no lower extremity edema.  Respiratory: Clear to auscultation bilaterally. Not using accessory muscles, speaking in full sentences.  Impression and Recommendations:

## 2015-07-06 NOTE — Assessment & Plan Note (Signed)
Plateauing of weight after 3 months, refilling phentermine and switching to one half tab twice a day and adding Topamax as we enter the fourth month.

## 2015-07-22 ENCOUNTER — Other Ambulatory Visit: Payer: Self-pay | Admitting: Sports Medicine

## 2015-08-02 ENCOUNTER — Encounter: Payer: Self-pay | Admitting: Sports Medicine

## 2015-08-02 ENCOUNTER — Ambulatory Visit (INDEPENDENT_AMBULATORY_CARE_PROVIDER_SITE_OTHER): Payer: PRIVATE HEALTH INSURANCE | Admitting: Sports Medicine

## 2015-08-02 DIAGNOSIS — M722 Plantar fascial fibromatosis: Secondary | ICD-10-CM | POA: Diagnosis not present

## 2015-08-02 DIAGNOSIS — R635 Abnormal weight gain: Secondary | ICD-10-CM | POA: Diagnosis not present

## 2015-08-02 MED ORDER — PHENTERMINE HCL 37.5 MG PO TABS: ORAL_TABLET | ORAL | 0 refills | Status: DC

## 2015-08-02 NOTE — Progress Notes (Signed)
  Subjective:    CC: Follow-up  HPI: Abnormal weight gain: 4 additional pound weight loss after 4 months of phentermine, was unable to tolerate Topamax.  Past medical history, Surgical history, Family history not pertinant except as noted below, Social history, Allergies, and medications have been entered into the medical record, reviewed, and no changes needed.   Review of Systems: No fevers, chills, night sweats, weight loss, chest pain, or shortness of breath.   Objective:    General: Well Developed, well nourished, and in no acute distress.  Neuro: Alert and oriented x3, extra-ocular muscles intact, sensation grossly intact.  HEENT: Normocephalic, atraumatic, pupils equal round reactive to light, neck supple, no masses, no lymphadenopathy, thyroid nonpalpable.  Skin: Warm and dry, no rashes. Cardiac: Regular rate and rhythm, no murmurs rubs or gallops, no lower extremity edema.  Respiratory: Clear to auscultation bilaterally. Not using accessory muscles, speaking in full sentences.  Impression and Recommendations:    Abnormal weight gain Additional 4 pound weight loss after 4 months of phentermine, we will do it for 2 more months. She was unable to tolerate Topamax, at the end we will probably add Contrave for maintenance.  Plantar fasciitis, right With some right-sided breakdown of the transverse arch with hammertoes. Patient will return next month with her orthotics for metatarsal pad placement

## 2015-08-02 NOTE — Assessment & Plan Note (Signed)
With some right-sided breakdown of the transverse arch with hammertoes. Patient will return next month with her orthotics for metatarsal pad placement

## 2015-08-02 NOTE — Assessment & Plan Note (Signed)
Additional 4 pound weight loss after 4 months of phentermine, we will do it for 2 more months. She was unable to tolerate Topamax, at the end we will probably add Contrave for maintenance.

## 2015-09-01 ENCOUNTER — Encounter: Payer: Self-pay | Admitting: Sports Medicine

## 2015-09-01 ENCOUNTER — Ambulatory Visit (INDEPENDENT_AMBULATORY_CARE_PROVIDER_SITE_OTHER): Payer: PRIVATE HEALTH INSURANCE | Admitting: Sports Medicine

## 2015-09-01 DIAGNOSIS — E039 Hypothyroidism, unspecified: Secondary | ICD-10-CM

## 2015-09-01 DIAGNOSIS — R635 Abnormal weight gain: Secondary | ICD-10-CM

## 2015-09-01 DIAGNOSIS — L301 Dyshidrosis [pompholyx]: Secondary | ICD-10-CM

## 2015-09-01 MED ORDER — BUPROPION HCL ER (XL) 150 MG PO TB24: ORAL_TABLET | ORAL | 3 refills | Status: DC

## 2015-09-01 MED ORDER — PHENTERMINE HCL 37.5 MG PO TABS: ORAL_TABLET | ORAL | 0 refills | Status: DC

## 2015-09-01 MED ORDER — PREDNISONE 50 MG PO TABS: 50.0000 mg | ORAL_TABLET | Freq: Every day | ORAL | 0 refills | Status: DC

## 2015-09-01 NOTE — Assessment & Plan Note (Signed)
Needs to restart topical clobetasol twice a day, adding 5 days of prednisone.

## 2015-09-01 NOTE — Progress Notes (Signed)
  Subjective:    CC: Follow-up  HPI: Abnormal weight gain: Has somewhat plateaued at 140 pounds after 4 months of phentermine. Goal is 130. She has some shorts she would like to fit in to.  Dyshidrotic eczema: Persistent.  Past medical history, Surgical history, Family history not pertinant except as noted below, Social history, Allergies, and medications have been entered into the medical record, reviewed, and no changes needed.   Review of Systems: No fevers, chills, night sweats, weight loss, chest pain, or shortness of breath.   Objective:    General: Well Developed, well nourished, and in no acute distress.  Neuro: Alert and oriented x3, extra-ocular muscles intact, sensation grossly intact.  HEENT: Normocephalic, atraumatic, pupils equal round reactive to light, neck supple, no masses, no lymphadenopathy, thyroid nonpalpable.  Skin: Warm and dry, no rashes. Cardiac: Regular rate and rhythm, no murmurs rubs or gallops, no lower extremity edema.  Respiratory: Clear to auscultation bilaterally. Not using accessory muscles, speaking in full sentences.  Impression and Recommendations:    Abnormal weight gain Refilling phentermine, has about 10 more pounds to go. We are entering the fifth month. Adding Wellbutrin with Contrave dosing.  Dyshidrotic eczema Needs to restart topical clobetasol twice a day, adding 5 days of prednisone.

## 2015-09-01 NOTE — Assessment & Plan Note (Signed)
Refilling phentermine, has about 10 more pounds to go. We are entering the fifth month. Adding Wellbutrin with Contrave dosing.

## 2015-09-27 LAB — CBC
HCT: 43.9 % (ref 35.0–45.0)
Hemoglobin: 14.9 g/dL (ref 11.7–15.5)
MCH: 30 pg (ref 27.0–33.0)
MCHC: 33.9 g/dL (ref 32.0–36.0)
MCV: 88.5 fL (ref 80.0–100.0)
MPV: 10.5 fL (ref 7.5–12.5)
Platelets: 233 10*3/uL (ref 140–400)
RBC: 4.96 MIL/uL (ref 3.80–5.10)
RDW: 13.8 % (ref 11.0–15.0)
WBC: 7.6 10*3/uL (ref 3.8–10.8)

## 2015-09-28 ENCOUNTER — Encounter: Payer: Self-pay | Admitting: Sports Medicine

## 2015-09-28 ENCOUNTER — Ambulatory Visit (INDEPENDENT_AMBULATORY_CARE_PROVIDER_SITE_OTHER): Payer: PRIVATE HEALTH INSURANCE | Admitting: Sports Medicine

## 2015-09-28 DIAGNOSIS — L301 Dyshidrosis [pompholyx]: Secondary | ICD-10-CM | POA: Diagnosis not present

## 2015-09-28 DIAGNOSIS — R635 Abnormal weight gain: Secondary | ICD-10-CM | POA: Diagnosis not present

## 2015-09-28 LAB — COMPREHENSIVE METABOLIC PANEL
ALT: 11 U/L (ref 6–29)
AST: 11 U/L (ref 10–30)
Albumin: 4.7 g/dL (ref 3.6–5.1)
Alkaline Phosphatase: 75 U/L (ref 33–115)
BUN: 12 mg/dL (ref 7–25)
CO2: 23 mmol/L (ref 20–31)
Calcium: 9.3 mg/dL (ref 8.6–10.2)
Chloride: 103 mmol/L (ref 98–110)
Creat: 0.92 mg/dL (ref 0.50–1.10)
Glucose, Bld: 86 mg/dL (ref 65–99)
Potassium: 4 mmol/L (ref 3.5–5.3)
Sodium: 138 mmol/L (ref 135–146)
Total Bilirubin: 0.6 mg/dL (ref 0.2–1.2)
Total Protein: 7.3 g/dL (ref 6.1–8.1)

## 2015-09-28 LAB — VITAMIN D 25 HYDROXY (VIT D DEFICIENCY, FRACTURES): Vit D, 25-Hydroxy: 33 ng/mL (ref 30–100)

## 2015-09-28 LAB — HEMOGLOBIN A1C
Hgb A1c MFr Bld: 4.8 % (ref <5.7)
Mean Plasma Glucose: 91 mg/dL

## 2015-09-28 LAB — LIPID PANEL
Cholesterol: 128 mg/dL (ref 125–200)
HDL: 40 mg/dL — ABNORMAL LOW (ref >=46)
LDL Cholesterol: 79 mg/dL (ref <130)
Total CHOL/HDL Ratio: 3.2 Ratio (ref <=5.0)
Triglycerides: 46 mg/dL (ref <150)
VLDL: 9 mg/dL (ref <30)

## 2015-09-28 LAB — TSH: TSH: 1.15 mIU/L

## 2015-09-28 MED ORDER — PHENTERMINE HCL 37.5 MG PO TABS: ORAL_TABLET | ORAL | 0 refills | Status: DC

## 2015-09-28 MED ORDER — CLOBETASOL PROPIONATE 0.05 % EX CREA: 1.0000 "application " | TOPICAL_CREAM | Freq: Two times a day (BID) | CUTANEOUS | 2 refills | Status: DC

## 2015-09-28 NOTE — Assessment & Plan Note (Signed)
Additional 2 pound weight loss, also currently doing Wellbutrin once per day.  Wellbutrin to twice per day, we will do another full month of full dose phentermine. Return to see me in one month.

## 2015-09-28 NOTE — Progress Notes (Signed)
  Subjective:    CC: Follow-up  HPI: Normal weight gain: Additional weight loss.  Dyshidrotic eczema: Needs a refill on clobetasol cream.  Past medical history:  Negative.  See flowsheet/record as well for more information.  Surgical history: Negative.  See flowsheet/record as well for more information.  Family history: Negative.  See flowsheet/record as well for more information.  Social history: Negative.  See flowsheet/record as well for more information.  Allergies, and medications have been entered into the medical record, reviewed, and no changes needed.   Review of Systems: No fevers, chills, night sweats, weight loss, chest pain, or shortness of breath.   Objective:    General: Well Developed, well nourished, and in no acute distress.  Neuro: Alert and oriented x3, extra-ocular muscles intact, sensation grossly intact.  HEENT: Normocephalic, atraumatic, pupils equal round reactive to light, neck supple, no masses, no lymphadenopathy, thyroid nonpalpable.  Skin: Warm and dry, no rashes. Cardiac: Regular rate and rhythm, no murmurs rubs or gallops, no lower extremity edema.  Respiratory: Clear to auscultation bilaterally. Not using accessory muscles, speaking in full sentences.  Impression and Recommendations:    Abnormal weight gain Additional 2 pound weight loss, also currently doing Wellbutrin once per day.  Wellbutrin to twice per day, we will do another full month of full dose phentermine. Return to see me in one month.  Dyshidrotic eczema Adding topical clobetasol

## 2015-09-28 NOTE — Assessment & Plan Note (Signed)
Adding topical clobetasol. 

## 2015-10-18 ENCOUNTER — Other Ambulatory Visit: Payer: Self-pay | Admitting: Sports Medicine

## 2015-10-26 ENCOUNTER — Encounter: Payer: Self-pay | Admitting: Sports Medicine

## 2015-10-26 ENCOUNTER — Ambulatory Visit (INDEPENDENT_AMBULATORY_CARE_PROVIDER_SITE_OTHER): Payer: PRIVATE HEALTH INSURANCE | Admitting: Sports Medicine

## 2015-10-26 DIAGNOSIS — R635 Abnormal weight gain: Secondary | ICD-10-CM | POA: Diagnosis not present

## 2015-10-26 DIAGNOSIS — L301 Dyshidrosis [pompholyx]: Secondary | ICD-10-CM | POA: Diagnosis not present

## 2015-10-26 DIAGNOSIS — J0100 Acute maxillary sinusitis, unspecified: Secondary | ICD-10-CM

## 2015-10-26 MED ORDER — FLUCONAZOLE 150 MG PO TABS: 150.0000 mg | ORAL_TABLET | Freq: Once | ORAL | 0 refills | Status: AC

## 2015-10-26 MED ORDER — AMOXICILLIN-POT CLAVULANATE 875-125 MG PO TABS: 1.0000 | ORAL_TABLET | Freq: Two times a day (BID) | ORAL | 0 refills | Status: AC

## 2015-10-26 MED ORDER — PHENTERMINE HCL 37.5 MG PO TABS: ORAL_TABLET | ORAL | 0 refills | Status: DC

## 2015-10-26 NOTE — Assessment & Plan Note (Signed)
Resolved with clobetasol.

## 2015-10-26 NOTE — Progress Notes (Signed)
  Subjective:    CC: Follow-up  HPI: Abnormal weight gain: Has lost a few ounces after the last month, she has finished 6 months of phentermine, has done well with increasing Wellbutrin to twice a day.  Dyshidrotic eczema: Resolved.  Sinus pain and pressure: Localized over the maxillary sinuses, moderate, persistent.  Past medical history:  Negative.  See flowsheet/record as well for more information.  Surgical history: Negative.  See flowsheet/record as well for more information.  Family history: Negative.  See flowsheet/record as well for more information.  Social history: Negative.  See flowsheet/record as well for more information.  Allergies, and medications have been entered into the medical record, reviewed, and no changes needed.   Review of Systems: No fevers, chills, night sweats, weight loss, chest pain, or shortness of breath.   Objective:    General: Well Developed, well nourished, and in no acute distress.  Neuro: Alert and oriented x3, extra-ocular muscles intact, sensation grossly intact.  HEENT: Normocephalic, atraumatic, pupils equal round reactive to light, neck supple, no masses, no lymphadenopathy, thyroid nonpalpable. Pain and pressure over the maxillary sinuses, oropharynx, nasopharynx, ear canals unremarkable. Skin: Warm and dry, no rashes. Cardiac: Regular rate and rhythm, no murmurs rubs or gallops, no lower extremity edema.  Respiratory: Clear to auscultation bilaterally. Not using accessory muscles, speaking in full sentences.  Impression and Recommendations:    Abnormal weight gain Has lost a few ounces, we have finished 6 months of phentermine, dropping to one half tab daily, three-month supply given and we will discontinue afterwards. Continue Wellbutrin twice a day. Continue aggressive resistance training and endurance exercise.  Dyshidrotic eczema Resolved with clobetasol.  Acute maxillary sinusitis Augmentin, Diflucan, Flonase.

## 2015-10-26 NOTE — Assessment & Plan Note (Signed)
Augmentin, Diflucan, Flonase.

## 2015-10-26 NOTE — Assessment & Plan Note (Signed)
Has lost a few ounces, we have finished 6 months of phentermine, dropping to one half tab daily, three-month supply given and we will discontinue afterwards. Continue Wellbutrin twice a day. Continue aggressive resistance training and endurance exercise.

## 2015-11-21 ENCOUNTER — Encounter: Payer: Self-pay | Admitting: Sports Medicine

## 2015-11-21 DIAGNOSIS — E039 Hypothyroidism, unspecified: Secondary | ICD-10-CM

## 2015-12-14 ENCOUNTER — Encounter: Payer: Self-pay | Admitting: Sports Medicine

## 2015-12-14 MED ORDER — PREDNISONE 10 MG (48) PO TBPK: ORAL_TABLET | Freq: Every day | ORAL | 0 refills | Status: DC

## 2016-01-05 ENCOUNTER — Other Ambulatory Visit: Payer: Self-pay | Admitting: Sports Medicine

## 2016-01-05 DIAGNOSIS — R635 Abnormal weight gain: Secondary | ICD-10-CM

## 2016-01-09 ENCOUNTER — Other Ambulatory Visit: Payer: Self-pay | Admitting: Sports Medicine

## 2016-01-26 ENCOUNTER — Ambulatory Visit: Payer: PRIVATE HEALTH INSURANCE | Admitting: Sports Medicine

## 2016-01-27 ENCOUNTER — Ambulatory Visit (INDEPENDENT_AMBULATORY_CARE_PROVIDER_SITE_OTHER): Payer: PRIVATE HEALTH INSURANCE | Admitting: Sports Medicine

## 2016-01-27 ENCOUNTER — Ambulatory Visit (INDEPENDENT_AMBULATORY_CARE_PROVIDER_SITE_OTHER): Payer: PRIVATE HEALTH INSURANCE

## 2016-01-27 ENCOUNTER — Encounter: Payer: Self-pay | Admitting: Sports Medicine

## 2016-01-27 DIAGNOSIS — M5030 Other cervical disc degeneration, unspecified cervical region: Secondary | ICD-10-CM | POA: Insufficient documentation

## 2016-01-27 DIAGNOSIS — R0781 Pleurodynia: Secondary | ICD-10-CM

## 2016-01-27 DIAGNOSIS — F39 Unspecified mood [affective] disorder: Secondary | ICD-10-CM

## 2016-01-27 DIAGNOSIS — E039 Hypothyroidism, unspecified: Secondary | ICD-10-CM | POA: Diagnosis not present

## 2016-01-27 DIAGNOSIS — R071 Chest pain on breathing: Secondary | ICD-10-CM | POA: Diagnosis not present

## 2016-01-27 DIAGNOSIS — G8929 Other chronic pain: Secondary | ICD-10-CM | POA: Diagnosis not present

## 2016-01-27 DIAGNOSIS — R1310 Dysphagia, unspecified: Secondary | ICD-10-CM | POA: Diagnosis not present

## 2016-01-27 DIAGNOSIS — M542 Cervicalgia: Secondary | ICD-10-CM

## 2016-01-27 DIAGNOSIS — R0602 Shortness of breath: Secondary | ICD-10-CM | POA: Diagnosis not present

## 2016-01-27 HISTORY — DX: Pleurodynia: R07.81

## 2016-01-27 MED ORDER — ALBUTEROL SULFATE HFA 108 (90 BASE) MCG/ACT IN AERS: INHALATION_SPRAY | RESPIRATORY_TRACT | 11 refills | Status: DC

## 2016-01-27 MED ORDER — BUPROPION HCL ER (XL) 300 MG PO TB24: 300.0000 mg | ORAL_TABLET | Freq: Every day | ORAL | 11 refills | Status: DC

## 2016-01-27 NOTE — Assessment & Plan Note (Signed)
likely a simple cervical strain, improved significantly with prednisone. Adding neck home rehabilitation as well as an x-ray. MRI if no better.

## 2016-01-27 NOTE — Assessment & Plan Note (Signed)
Still awaiting thyroid ultrasound.

## 2016-01-27 NOTE — Assessment & Plan Note (Signed)
Doing extremely well with Wellbutrin, switching to 300 mg extended release once daily.

## 2016-01-27 NOTE — Assessment & Plan Note (Signed)
Occasional vague choking on food and liquids, likely nonpathologic. We are going to proceed with a barium swallow evaluation.

## 2016-01-27 NOTE — Progress Notes (Signed)
Subjective:    CC: "red spot on right tonsil" HPI:  Patient is a 36yo female who presents to the clinic with several concerns.  She reports she has chest pain for the past month is worse with deep breaths and is in her back over her ribs and left anterior chest.  Patient denies any recent illness, cough, or new exercises that could have strained a muscle.  Patient denies any calf swelling/tenderness, recent immobilization, or history of a DVT.    Patient reports she has been experiencing a "fullness" in her throat for the past week.  Patient reports she noticed a a lesion on her right tonsil yesterday, which is only painful if she swallows something solid.  Patient reports she has an ongoing history of dysphagia to solids and liquids.  Patient denies fever, hoarseness, or voice changes.    Patient states she been experiencing neck pain and stiffness since November.  Patient reports when she took prednisone for her hands it helped diminish the pain, but the pain is still present.    Patient reports she had not received her thyroid ultrasound yet, but she plans on going within the next 2 weeks.    Patient reports she is doing great on the wellbutrin.  She feels like it has helped with her concentration and overall mood.  Patient states she is no longer taking phentermine due to maintaining her weight.      Past medical history:  Negative.  See flowsheet/record as well for more information.  Surgical history: Negative.  See flowsheet/record as well for more information.  Family history: Negative.  See flowsheet/record as well for more information.  Social history: Negative.  See flowsheet/record as well for more information.  Allergies, and medications have been entered into the medical record, reviewed, and no changes needed.   Review of Systems: No fevers, chills, night sweats, weight loss, chest pain, or shortness of breath.   Objective:    General: Well Developed, well nourished, and in  no acute distress.  Neuro: Alert and oriented x3, extra-ocular muscles intact, sensation grossly intact.  HEENT: Normocephalic, atraumatic, pupils equal round reactive to light, neck supple, no masses, no lymphadenopathy, thyroid nonpalpable.  Throat:small erythematous spot on right tonsil.   Skin: Warm and dry, no rashes. Cardiac: Regular rate and rhythm, no murmurs rubs or gallops, no lower extremity edema.  Respiratory: Clear to auscultation bilaterally. Not using accessory muscles, speaking in full sentences. Neck: Inspection unremarkable. No palpable stepoffs. Negative Spurling's maneuver. Full neck range of motion Grip strength and sensation normal in bilateral hands Strength good C4 to T1 distribution No sensory change to C4 to T1 Negative Hoffman sign bilaterally Reflexes normal Extremities: Negative Homan's sign bilaterally.    Impression and Recommendations:    No problem-specific Assessment & Plan notes found for this encounter.  Marland KitchenLurena Joiner was seen today for shortness of breath.  Diagnoses and all orders for this visit:  Hypothyroidism, unspecified type  Dysphagia, unspecified type -     DG Esophagus; Future  Mood disorder (HCC) -     buPROPion (WELLBUTRIN XL) 300 MG 24 hr tablet; Take 1 tablet (300 mg total) by mouth daily.  Neck pain -     DG Cervical Spine Complete; Future  Pleuritic chest pain -     CBC -     Comprehensive metabolic panel -     D-dimer, quantitative (not at [facility]) -     DG Chest 2 View; Future -     albuterol (  PROVENTIL HFA;VENTOLIN HFA) 108 (90 Base) MCG/ACT inhaler; 2 puffs 15 minutes before physical activity and exercise   Patient has a recurrent history of dysphagia, so she will complete a barium swallow study.    Patient will be switched from twice a day Wellbutrin to Wellbutrin XL 300mg .  This patient will be started on an albuterol inhaler for her pleuritic chest pain. A CBC and CMP will be checked.  A CXR and D dimer were  ordered.    Patient is going to get her thyroid ultrasound within the next two weeks.    Due to patient's chronic neck pain, she will be getting a  C-spine x-ray.  She was given instructions on neck exercises she can perform.

## 2016-01-27 NOTE — Progress Notes (Signed)
I spent 40 minutes with this patient, greater than 50% was face-to-face time counseling regarding the above diagnoses

## 2016-01-27 NOTE — Assessment & Plan Note (Signed)
Present now for one month, really has no risk factors for PE. Checking a d-dimer, CBC, CMP, chest x-ray, I'm going to give her albuterol for use for exercise, I do suspect this is simply cold air related reactive airways.

## 2016-01-28 LAB — CBC
HCT: 41.6 % (ref 35.0–45.0)
Hemoglobin: 13.8 g/dL (ref 11.7–15.5)
MCH: 29.6 pg (ref 27.0–33.0)
MCHC: 33.2 g/dL (ref 32.0–36.0)
MCV: 89.3 fL (ref 80.0–100.0)
MPV: 10.5 fL (ref 7.5–12.5)
Platelets: 227 10*3/uL (ref 140–400)
RBC: 4.66 MIL/uL (ref 3.80–5.10)
RDW: 13.9 % (ref 11.0–15.0)
WBC: 9.4 10*3/uL (ref 3.8–10.8)

## 2016-01-28 LAB — COMPREHENSIVE METABOLIC PANEL
ALT: 15 U/L (ref 6–29)
AST: 12 U/L (ref 10–30)
Albumin: 4.4 g/dL (ref 3.6–5.1)
Alkaline Phosphatase: 52 U/L (ref 33–115)
BUN: 9 mg/dL (ref 7–25)
CO2: 29 mmol/L (ref 20–31)
Calcium: 8.7 mg/dL (ref 8.6–10.2)
Chloride: 105 mmol/L (ref 98–110)
Creat: 0.82 mg/dL (ref 0.50–1.10)
Glucose, Bld: 74 mg/dL (ref 65–99)
Potassium: 4 mmol/L (ref 3.5–5.3)
Sodium: 139 mmol/L (ref 135–146)
Total Bilirubin: 0.3 mg/dL (ref 0.2–1.2)
Total Protein: 6.7 g/dL (ref 6.1–8.1)

## 2016-01-28 LAB — D-DIMER, QUANTITATIVE (NOT AT ARMC): D-Dimer, Quant: 0.37 mcg/mL FEU (ref <0.50)

## 2016-01-30 ENCOUNTER — Ambulatory Visit: Payer: PRIVATE HEALTH INSURANCE | Admitting: Sports Medicine

## 2016-02-03 ENCOUNTER — Ambulatory Visit
Admission: RE | Admit: 2016-02-03 | Discharge: 2016-02-03 | Disposition: A | Discharge status: Home / Self Care | Payer: PRIVATE HEALTH INSURANCE | Source: Ambulatory Visit | Attending: Sports Medicine | Admitting: Sports Medicine

## 2016-02-03 DIAGNOSIS — E039 Hypothyroidism, unspecified: Secondary | ICD-10-CM

## 2016-02-09 ENCOUNTER — Other Ambulatory Visit: Payer: PRIVATE HEALTH INSURANCE

## 2016-02-13 ENCOUNTER — Encounter: Payer: Self-pay | Admitting: Sports Medicine

## 2016-02-13 MED ORDER — PREDNISONE 10 MG (48) PO TBPK: ORAL_TABLET | Freq: Every day | ORAL | 0 refills | Status: DC

## 2016-03-07 ENCOUNTER — Encounter: Payer: Self-pay | Admitting: Sports Medicine

## 2016-03-08 ENCOUNTER — Encounter: Payer: Self-pay | Admitting: Sports Medicine

## 2016-03-08 DIAGNOSIS — M5412 Radiculopathy, cervical region: Secondary | ICD-10-CM

## 2016-03-12 ENCOUNTER — Other Ambulatory Visit: Payer: PRIVATE HEALTH INSURANCE

## 2016-03-15 ENCOUNTER — Ambulatory Visit
Admission: RE | Admit: 2016-03-15 | Discharge: 2016-03-15 | Disposition: A | Discharge status: Home / Self Care | Payer: PRIVATE HEALTH INSURANCE | Source: Ambulatory Visit | Attending: Sports Medicine | Admitting: Sports Medicine

## 2016-03-15 DIAGNOSIS — M5412 Radiculopathy, cervical region: Secondary | ICD-10-CM

## 2016-04-01 ENCOUNTER — Other Ambulatory Visit: Payer: Self-pay | Admitting: Sports Medicine

## 2016-05-30 ENCOUNTER — Encounter: Payer: Self-pay | Admitting: Sports Medicine

## 2016-05-30 DIAGNOSIS — R0781 Pleurodynia: Secondary | ICD-10-CM

## 2016-05-31 NOTE — Assessment & Plan Note (Signed)
Labs were negative including d-dimer, albuterol has been minimally effective. Initially I suspected this was cold air related reactive airway disease/exercise-induced bronchoconstriction. She will return for pre-and postbronchodilator spirometry.

## 2016-06-05 ENCOUNTER — Ambulatory Visit (INDEPENDENT_AMBULATORY_CARE_PROVIDER_SITE_OTHER): Payer: PRIVATE HEALTH INSURANCE | Admitting: Sports Medicine

## 2016-06-05 ENCOUNTER — Ambulatory Visit
Admission: RE | Admit: 2016-06-05 | Discharge: 2016-06-05 | Disposition: A | Discharge status: Home / Self Care | Payer: PRIVATE HEALTH INSURANCE | Source: Ambulatory Visit | Attending: Sports Medicine | Admitting: Sports Medicine

## 2016-06-05 ENCOUNTER — Encounter: Payer: Self-pay | Admitting: Sports Medicine

## 2016-06-05 DIAGNOSIS — R0781 Pleurodynia: Secondary | ICD-10-CM

## 2016-06-05 LAB — CBC
HCT: 43 % (ref 35.0–45.0)
Hemoglobin: 14.6 g/dL (ref 11.7–15.5)
MCH: 30.4 pg (ref 27.0–33.0)
MCHC: 34 g/dL (ref 32.0–36.0)
MCV: 89.4 fL (ref 80.0–100.0)
MPV: 9.7 fL (ref 7.5–12.5)
Platelets: 241 10*3/uL (ref 140–400)
RBC: 4.81 MIL/uL (ref 3.80–5.10)
RDW: 13.4 % (ref 11.0–15.0)
WBC: 8.2 10*3/uL (ref 3.8–10.8)

## 2016-06-05 LAB — BRAIN NATRIURETIC PEPTIDE: Brain Natriuretic Peptide: 11 pg/mL (ref <100)

## 2016-06-05 LAB — D-DIMER, QUANTITATIVE: D-Dimer, Quant: 0.42 mcg/mL FEU (ref <0.50)

## 2016-06-05 MED ORDER — BENZONATATE 200 MG PO CAPS: 200.0000 mg | ORAL_CAPSULE | Freq: Three times a day (TID) | ORAL | 0 refills | Status: DC | PRN

## 2016-06-05 MED ORDER — PREDNISONE 50 MG PO TABS: 50.0000 mg | ORAL_TABLET | Freq: Every day | ORAL | 0 refills | Status: DC

## 2016-06-05 MED ORDER — IOPAMIDOL (ISOVUE-300) INJECTION 61%
75.0000 mL | Freq: Once | INTRAVENOUS | Status: AC | PRN
  Administered 2016-06-05: 75 mL via INTRAVENOUS

## 2016-06-05 NOTE — Assessment & Plan Note (Addendum)
Still unclear etiology, rechecking d-dimer, x-rays have been negative, we are going to proceed with CT with IV contrast. Treatments for pleuritis and costochondritis have not been effective. She made the wrong appointment by herself on Mychart today for pre-and post bronchodilator spirometry, she will return, preferably on [name]'s scheduled tomorrow for spirometry testing. Patient is tearful in the exam room suggesting an anxious/depressive component, vocal cord dysfunction is certainly in the differential. Adding Tessalon Perles, burst of prednisone. Desires autoimmune testing.

## 2016-06-05 NOTE — Progress Notes (Signed)
  Subjective:    CC: Coughing  HPI: Alicia Sanders returns to discuss her vague shortness of breath and chest pain that has been present for several months now.  She describes parasternal bilateral chest pain, worse with deep breathing and palpation. Occasionally she'll cough at night which is extremely painful. She has become extremely anxious because of this. Denies any exertional component, no nausea, diaphoresis. We have done a d-dimer as well as chest x-ray in the past both of which were negative. Initially the plan was to do a pre-and postbronchodilator spirometry test, unfortunately she made her appointment on my chart only and 15 minutes, so we could not do it today. Symptoms are severe, persistent. She really didn't respond to NSAIDs in the past for questionable costochondritis. She is requesting autoimmune testing today.  Past medical history:  Negative.  See flowsheet/record as well for more information.  Surgical history: Negative.  See flowsheet/record as well for more information.  Family history: Negative.  See flowsheet/record as well for more information.  Social history: Negative.  See flowsheet/record as well for more information.  Allergies, and medications have been entered into the medical record, reviewed, and no changes needed.   Review of Systems: No fevers, chills, night sweats, weight loss, chest pain, or shortness of breath.   Objective:    General: Well Developed, well nourished, and in no acute distress.  Neuro: Alert and oriented x3, extra-ocular muscles intact, sensation grossly intact.  HEENT: Normocephalic, atraumatic, pupils equal round reactive to light, neck supple, no masses, no lymphadenopathy, thyroid nonpalpable.  Skin: Warm and dry, no rashes. Cardiac: Regular rate and rhythm, no murmurs rubs or gallops, no lower extremity edema.  Respiratory: Clear to auscultation bilaterally. Not using accessory muscles, speaking in full sentences.Only minimal tenderness  over the costal cartilage.  Impression and Recommendations:    Pleuritic chest pain Still unclear etiology, rechecking d-dimer, x-rays have been negative, we are going to proceed with CT with IV contrast. Treatments for pleuritis and costochondritis have not been effective. She made the wrong appointment by herself on Mychart today for pre-and post bronchodilator spirometry, she will return, preferably on [name][facility] scheduled tomorrow for spirometry testing. Patient is tearful in the exam room suggesting an anxious/depressive component, vocal cord dysfunction is certainly in the differential. Adding Tessalon Perles, burst of prednisone. Desires autoimmune testing.  I spent 40 minutes with this patient, greater than 50% was face-to-face time counseling regarding the above diagnoses.

## 2016-06-06 LAB — ANCA SCREEN W REFLEX TITER: ANCA Screen: NEGATIVE

## 2016-06-06 LAB — SEDIMENTATION RATE: Sed Rate: 4 mm/hr (ref 0–20)

## 2016-06-06 LAB — CK: Total CK: 81 U/L (ref 29–143)

## 2016-06-06 LAB — ANA: Anti Nuclear Antibody(ANA): NEGATIVE

## 2016-06-06 LAB — RHEUMATOID FACTOR: Rheumatoid fact SerPl-aCnc: <14 [IU]/mL (ref <14)

## 2016-06-06 LAB — ANGIOTENSIN CONVERTING ENZYME: Angiotensin-Converting Enzyme: 38 U/L (ref 9–67)

## 2016-06-06 LAB — CYCLIC CITRUL PEPTIDE ANTIBODY, IGG: Cyclic Citrullin Peptide Ab: <16 Units

## 2016-06-07 LAB — QUANTIFERON TB GOLD ASSAY (BLOOD)
Interferon Gamma Release Assay: NEGATIVE
Mitogen-Nil: 9.41 IU/mL
Quantiferon Nil Value: 0.04 IU/mL
Quantiferon Tb Ag Minus Nil Value: <0 IU/mL

## 2016-06-11 ENCOUNTER — Encounter: Payer: Self-pay | Admitting: Family Medicine

## 2016-06-11 ENCOUNTER — Ambulatory Visit (INDEPENDENT_AMBULATORY_CARE_PROVIDER_SITE_OTHER): Payer: PRIVATE HEALTH INSURANCE | Admitting: Family Medicine

## 2016-06-11 VITALS — BP 133/85 | HR 93 | Wt 148.0 lb

## 2016-06-11 DIAGNOSIS — M940 Chondrocostal junction syndrome [Tietze]: Secondary | ICD-10-CM

## 2016-06-11 DIAGNOSIS — R0781 Pleurodynia: Secondary | ICD-10-CM | POA: Diagnosis not present

## 2016-06-11 LAB — PULMONARY FUNCTION TEST

## 2016-06-11 NOTE — Progress Notes (Signed)
   Subjective:    Patient ID: Alicia Sanders, female    DOB: 1980/08/23, 36 y.o.   MRN: [id number]  HPI A she was having persistent chest pain for several months. She says it would hurt with every breath and was radiating around to her sides. She says it was extremely uncomfortable even to lay down. She came in was evaluated. She does for has had a negative workup for pneumonia, pulmonary embolism etc. She was asked to come in today for spirometry to evaluate lung function. She did take the prednisone and says it actually has really made a big difference in her pain.   Review of Systems     Objective:   Physical Exam  Constitutional: She is oriented to person, place, and time. She appears well-developed and well-nourished.  HENT:  Head: Normocephalic and atraumatic.  Cardiovascular: Normal rate, regular rhythm and normal heart sounds.   Pulmonary/Chest: Effort normal and breath sounds normal.  Neurological: She is alert and oriented to person, place, and time.  Skin: Skin is warm and dry.  Psychiatric: She has a normal mood and affect. Her behavior is normal.          Assessment & Plan:  Normal Spirometry - Reveals FVC of the 108%, FEV1 of 111% with a ratio of 84%. No significant improvement postbronchodilator. Reviewed results with patient and gave her reassurance. Truly suspect that her symptoms were most consistent with costochondritis as she is feeling significantly better after round of prednisone.

## 2016-06-19 ENCOUNTER — Encounter: Payer: Self-pay | Admitting: Physician Assistant

## 2016-06-25 ENCOUNTER — Ambulatory Visit (INDEPENDENT_AMBULATORY_CARE_PROVIDER_SITE_OTHER): Payer: PRIVATE HEALTH INSURANCE | Admitting: Physician Assistant

## 2016-06-25 ENCOUNTER — Encounter: Payer: Self-pay | Admitting: Physician Assistant

## 2016-06-25 VITALS — BP 133/88 | HR 101 | Resp 18 | Wt 165.2 lb

## 2016-06-25 DIAGNOSIS — R635 Abnormal weight gain: Secondary | ICD-10-CM | POA: Diagnosis not present

## 2016-06-25 DIAGNOSIS — B078 Other viral warts: Secondary | ICD-10-CM

## 2016-06-25 DIAGNOSIS — E663 Overweight: Secondary | ICD-10-CM

## 2016-06-25 DIAGNOSIS — J3089 Other allergic rhinitis: Secondary | ICD-10-CM | POA: Diagnosis not present

## 2016-06-25 MED ORDER — PHENTERMINE HCL 37.5 MG PO CAPS: 37.5000 mg | ORAL_CAPSULE | ORAL | 0 refills | Status: DC

## 2016-06-25 MED ORDER — CROMOLYN SODIUM 5.2 MG/ACT NA AERS: 2.0000 | INHALATION_SPRAY | Freq: Three times a day (TID) | NASAL | 11 refills | Status: DC

## 2016-06-25 MED ORDER — CETIRIZINE HCL 10 MG PO TABS: 10.0000 mg | ORAL_TABLET | Freq: Every day | ORAL | 11 refills | Status: DC

## 2016-06-25 NOTE — Progress Notes (Signed)
HPI:                                                                Alicia Sanders is a 36 y.o. female who presents to [place]: [place] today for weight gain  Patient reports approximately 15 pound weight gain in less than 1 month. She endorses polyphagia which she attributes to multiple rounds of Prednisone for URI's and dyshidrotic eczema. She has done Phentermine in the past with Dr. Dianah Field and would like to go on this medication again. She states goal weight is 135 pounds, which is approximately a 30 pound weight loss. She has joined [employer] with her husband and is motivated to [employer] and exercise regularly. She has some limitations to rigorous exercise due to degenerative disc disease.  Patient also reports a wart that has been present on her left thumb for months. It is painful and the skin is cracking. She has not tried any treatments for this.  Past Medical History:  Diagnosis Date  . Back problem   . Thyroid disease    Past Surgical History:  Procedure Laterality Date  . HAND SURGERY Right    Social History  Substance Use Topics  . Smoking status: Never Smoker  . Smokeless tobacco: Never Used  . Alcohol use Not on file   family history is not on file.  ROS: negative except as noted in the HPI  Medications: Current Outpatient Prescriptions  Medication Sig Dispense Refill  . albuterol (PROVENTIL HFA;VENTOLIN HFA) 108 (90 Base) MCG/ACT inhaler 2 puffs 15 minutes before physical activity and exercise 2 Inhaler 11  . buPROPion (WELLBUTRIN XL) 300 MG 24 hr tablet Take 1 tablet (300 mg total) by mouth daily. 30 tablet 11  . cetirizine (ZYRTEC) 10 MG tablet Take 1 tablet (10 mg total) by mouth daily. 30 tablet 11  . clobetasol cream (TEMOVATE) 0.92 % Apply 1 application topically 2 (two) times daily. 60 g 2  . clonazePAM (KLONOPIN) 0.5 MG tablet TAKE 1 TABLET BY MOUTH THREE TIMES DAILY AS NEEDED 20 tablet 0   . cromolyn (NASALCROM) 5.2 MG/ACT nasal spray Place 2 sprays into both nostrils 3 (three) times daily. In each nostrl 1 mL 11  . cyclobenzaprine (FLEXERIL) 10 MG tablet One half tab PO qHS, then increase gradually to one tab TID. 30 tablet 0  . fluticasone (FLONASE) 50 MCG/ACT nasal spray Two spray in each nostril twice a day, use left hand for right nostril, and right hand for left nostril. 48 g 3  . levothyroxine (SYNTHROID, LEVOTHROID) 50 MCG tablet Take 1 tablet (50 mcg total) by mouth daily. 30 tablet 5  . LORZONE 750 MG TABS TAKE 1 TABLET BY MOUTH THREE TIMES DAILY 30 tablet 0  . meloxicam (MOBIC) 15 MG tablet One tab PO qAM with breakfast for 2 weeks, then daily prn pain. 30 tablet 3  . phentermine 37.5 MG capsule Take 1 capsule (37.5 mg total) by mouth every morning. 30 capsule 0  . traMADol (ULTRAM) 50 MG tablet TAKE 1 TABLET BY MOUTH THREE TIMES DAILY AS NEEDED 270 tablet 0   No current facility-administered medications for this visit.    Allergies  Allergen Reactions  . Diflucan [Fluconazole] Diarrhea  severe       Objective:  BP 133/88   Pulse (!) 101   Resp 18   Wt 165 lb 3.2 oz (74.9 kg)   BMI 27.49 kg/m  Gen: well-groomed, cooperative, not ill-appearing, no distress Pulm: Normal work of breathing, normal phonation, clear to auscultation bilaterally CV: Rate 88 on auscultation, regular rhythm, s1 and s2 distinct, no murmurs, clicks or rubs  Neuro: alert and oriented x 3, EOM's intact, no tremor MSK: moving all extremities, normal gait and station, no peripheral edema Skin: warm, dry, intact; hyperkeratotic lesion with central hyperpigmentation of the left lateral thumb including the lateral nail fold consistent with viral wart Psych: good eye contact, normal affect, euthymic mood, normal speech and thought content  No results found for this or any previous visit (from the past 72 hour(s)). No results found.    Assessment and Plan: 36 y.o. female with   1.  Abnormal weight gain - discussed with patient that I did not feel she met criteria for weight loss medication at this point as she is not at an obese BMI and does not have serious comorbidity (no hypertension, diabetes, hyperlipidemia). Explained that I felt risk of medication exceeded benefit at this point - Since she has been followed by another provider in the practice, Dr. Dianah Field, she was referred to him for weight check and he will manage Phentermine and medical weight loss - discontinue OTC decongestants including Zyrtec-D  2. Overweight (BMI 25.0-29.9) - therapeutic lifestyle changes - Limit calories to 1400 per day. Use a calorie counter app and measure food - Get at least 4, 45 minutes sessions per week of cardiovascular exercise - Limit alcohol to no more than 1 drink per day or avoid altogether  3. Non-seasonal allergic rhinitis, unspecified trigger - cetirizine (ZYRTEC) 10 MG tablet; Take 1 tablet (10 mg total) by mouth daily.  Dispense: 30 tablet; Refill: 11 - cromolyn (NASALCROM) 5.2 MG/ACT nasal spray; Place 2 sprays into both nostrils 3 (three) times daily. In each nostrl  Dispense: 1 mL; Refill: 11  4. Other viral warts Cryotherapy template Procedure: Cryodestruction of: wart, left 1st digit/lateral nail fold Consent obtained and verified. Time-out conducted. Noted no overlying erythema, induration, or other signs of local infection. Completed without difficulty using Cryo-Gun. Advised to call if fevers/chills, erythema, induration, drainage, or persistent bleeding. Follow-up in 4 weeks for repeat cryodestruction   Patient education and anticipatory guidance given Patient agrees with treatment plan Follow-up in 4 weeks for weight check or sooner as needed if symptoms worsen or fail to improve  Darlyne Russian PA-C

## 2016-06-25 NOTE — Patient Instructions (Addendum)
- Take Phentermine first thing in the morning or break in 1/2 and take 1/2 in the am and 1/2 at lunch time - STOP ZYRTEC-D and avoid OTC decongestants. Use Flonase and Cromolyn nasal spray and Zyrtec without decongestant - Limit calories to 1400 per day. Use a calorie counter app and measure food - Get at least 4, 45 minutes sessions per week of cardiovascular exercise - Limit alcohol to no more than 1 drink per day or avoid altogether - Return in 4 weeks for weight check w/Dr. Karie Schwalbe  Phentermine tablets or capsules What is this medicine? PHENTERMINE (FEN ter meen) decreases your appetite. It is used with a reduced calorie diet and exercise to help you lose weight. This medicine may be used for other purposes; ask your health care provider or pharmacist if you have questions. COMMON BRAND NAME(S): Adipex-P, Atti-Plex P, Atti-Plex P Spansule, Fastin, Lomaira, Pro-Fast, Tara-8 What should I tell my health care provider before I take this medicine? They need to know if you have any of these conditions: -agitation -glaucoma -heart disease -high blood pressure -history of substance abuse -lung disease called Primary Pulmonary Hypertension (PPH) -taken an MAOI like Carbex, Eldepryl, Marplan, Nardil, or Parnate in last 14 days -thyroid disease -an unusual or allergic reaction to phentermine, other medicines, foods, dyes, or preservatives -pregnant or trying to get pregnant -breast-feeding How should I use this medicine? Take this medicine by mouth with a glass of water. Follow the directions on the prescription label. The instructions for use may differ based on the product and dose you are taking. Avoid taking this medicine in the evening. It may interfere with sleep. Take your doses at regular intervals. Do not take your medicine more often than directed. Talk to your pediatrician regarding the use of this medicine in children. While this drug may be prescribed for children 17 years or older for  selected conditions, precautions do apply. Overdosage: If you think you have taken too much of this medicine contact a poison control center or emergency room at once. NOTE: This medicine is only for you. Do not share this medicine with others. What if I miss a dose? If you miss a dose, take it as soon as you can. If it is almost time for your next dose, take only that dose. Do not take double or extra doses. What may interact with this medicine? Do not take this medicine with any of the following medications: -duloxetine -MAOIs like Carbex, Eldepryl, Marplan, Nardil, and Parnate -medicines for colds or breathing difficulties like pseudoephedrine or phenylephrine -procarbazine -sibutramine -SSRIs like citalopram, escitalopram, fluoxetine, fluvoxamine, paroxetine, and sertraline -stimulants like dexmethylphenidate, methylphenidate or modafinil -venlafaxine This medicine may also interact with the following medications: -medicines for diabetes This list may not describe all possible interactions. Give your health care provider a list of all the medicines, herbs, non-prescription drugs, or dietary supplements you use. Also tell them if you smoke, drink alcohol, or use illegal drugs. Some items may interact with your medicine. What should I watch for while using this medicine? Notify your physician immediately if you become short of breath while doing your normal activities. Do not take this medicine within 6 hours of bedtime. It can keep you from getting to sleep. Avoid drinks that contain caffeine and try to stick to a regular bedtime every night. This medicine was intended to be used in addition to a healthy diet and exercise. The best results are achieved this way. This medicine is only indicated for  short-term use. Eventually your weight loss may level out. At that point, the drug will only help you maintain your new weight. Do not increase or in any way change your dose without consulting your  doctor. You may get drowsy or dizzy. Do not drive, use machinery, or do anything that needs mental alertness until you know how this medicine affects you. Do not stand or sit up quickly, especially if you are an older patient. This reduces the risk of dizzy or fainting spells. Alcohol may increase dizziness and drowsiness. Avoid alcoholic drinks. What side effects may I notice from receiving this medicine? Side effects that you should report to your doctor or health care professional as soon as possible: -chest pain, palpitations -depression or severe changes in mood -increased blood pressure -irritability -nervousness or restlessness -severe dizziness -shortness of breath -problems urinating -unusual swelling of the legs -vomiting Side effects that usually do not require medical attention (report to your doctor or health care professional if they continue or are bothersome): -blurred vision or other eye problems -changes in sexual ability or desire -constipation or diarrhea -difficulty sleeping -dry mouth or unpleasant taste -headache -nausea This list may not describe all possible side effects. Call your doctor for medical advice about side effects. You may report side effects to FDA at 1-800-FDA-1088. Where should I keep my medicine? Keep out of the reach of children. This medicine can be abused. Keep your medicine in a safe place to protect it from theft. Do not share this medicine with anyone. Selling or giving away this medicine is dangerous and against the law. This medicine may cause accidental overdose and death if taken by other adults, children, or pets. Mix any unused medicine with a substance like cat litter or coffee grounds. Then throw the medicine away in a sealed container like a sealed bag or a coffee can with a lid. Do not use the medicine after the expiration date. Store at room temperature between 20 and 25 degrees C (68 and 77 degrees F). Keep container tightly  closed. NOTE: This sheet is a summary. It may not cover all possible information. If you have questions about this medicine, talk to your doctor, pharmacist, or health care provider.  2018 Elsevier/Gold Standard (2014-09-24 12:53:15)

## 2016-06-26 ENCOUNTER — Other Ambulatory Visit: Payer: Self-pay | Admitting: Sports Medicine

## 2016-06-26 DIAGNOSIS — B078 Other viral warts: Secondary | ICD-10-CM | POA: Insufficient documentation

## 2016-06-28 ENCOUNTER — Other Ambulatory Visit: Payer: Self-pay | Admitting: Sports Medicine

## 2016-06-28 MED ORDER — TRAMADOL HCL 50 MG PO TABS: 50.0000 mg | ORAL_TABLET | Freq: Three times a day (TID) | ORAL | 0 refills | Status: DC | PRN

## 2016-07-23 ENCOUNTER — Ambulatory Visit (INDEPENDENT_AMBULATORY_CARE_PROVIDER_SITE_OTHER): Payer: PRIVATE HEALTH INSURANCE | Admitting: Sports Medicine

## 2016-07-23 DIAGNOSIS — R635 Abnormal weight gain: Secondary | ICD-10-CM

## 2016-07-23 MED ORDER — PHENTERMINE HCL 37.5 MG PO TABS
ORAL_TABLET | ORAL | 0 refills | Status: DC
Start: 2016-07-23 — End: 2016-08-20

## 2016-07-23 NOTE — Assessment & Plan Note (Signed)
Good 5 pound weight loss after the first month on phentermine. Refilling, return in 1 month for a weight check.

## 2016-07-23 NOTE — Progress Notes (Signed)
  Subjective:    CC: Weight check  HPI: 5 pound weight loss after one month of phentermine. No adverse effects.  Past medical history:  Negative.  See flowsheet/record as well for more information.  Surgical history: Negative.  See flowsheet/record as well for more information.  Family history: Negative.  See flowsheet/record as well for more information.  Social history: Negative.  See flowsheet/record as well for more information.  Allergies, and medications have been entered into the medical record, reviewed, and no changes needed.   Review of Systems: No fevers, chills, night sweats, weight loss, chest pain, or shortness of breath.   Objective:    General: Well Developed, well nourished, and in no acute distress.  Neuro: Alert and oriented x3, extra-ocular muscles intact, sensation grossly intact.  HEENT: Normocephalic, atraumatic, pupils equal round reactive to light, neck supple, no masses, no lymphadenopathy, thyroid nonpalpable.  Skin: Warm and dry, no rashes. Cardiac: Regular rate and rhythm, no murmurs rubs or gallops, no lower extremity edema.  Respiratory: Clear to auscultation bilaterally. Not using accessory muscles, speaking in full sentences.  Impression and Recommendations:    Abnormal weight gain Good 5 pound weight loss after the first month on phentermine. Refilling, return in 1 month for a weight check.

## 2016-08-20 ENCOUNTER — Encounter: Payer: Self-pay | Admitting: Sports Medicine

## 2016-08-20 ENCOUNTER — Ambulatory Visit (INDEPENDENT_AMBULATORY_CARE_PROVIDER_SITE_OTHER): Payer: PRIVATE HEALTH INSURANCE | Admitting: Sports Medicine

## 2016-08-20 DIAGNOSIS — R635 Abnormal weight gain: Secondary | ICD-10-CM

## 2016-08-20 MED ORDER — PHENTERMINE HCL 37.5 MG PO TABS: ORAL_TABLET | ORAL | 0 refills | Status: DC

## 2016-08-20 NOTE — Progress Notes (Signed)
  Subjective:    CC: Weight check  HPI: Has lost another 5 pounds after the second month, total weight loss 10 pounds so far. She is a bit frustrated because her husband is losing more. No adverse effects with the exception of dry mouth, no constipation. No palpitations.  Past medical history:  Negative.  See flowsheet/record as well for more information.  Surgical history: Negative.  See flowsheet/record as well for more information.  Family history: Negative.  See flowsheet/record as well for more information.  Social history: Negative.  See flowsheet/record as well for more information.  Allergies, and medications have been entered into the medical record, reviewed, and no changes needed.   Review of Systems: No fevers, chills, night sweats, weight loss, chest pain, or shortness of breath.   Objective:    General: Well Developed, well nourished, and in no acute distress.  Neuro: Alert and oriented x3, extra-ocular muscles intact, sensation grossly intact.  HEENT: Normocephalic, atraumatic, pupils equal round reactive to light, neck supple, no masses, no lymphadenopathy, thyroid nonpalpable.  Skin: Warm and dry, no rashes. Cardiac: Regular rate and rhythm, no murmurs rubs or gallops, no lower extremity edema.  Respiratory: Clear to auscultation bilaterally. Not using accessory muscles, speaking in full sentences.  Impression and Recommendations:    Abnormal weight gain Additional 5 pound weight loss after the second month phentermine, return in one month.

## 2016-08-20 NOTE — Assessment & Plan Note (Signed)
Additional 5 pound weight loss after the second month phentermine, return in one month.

## 2016-09-11 ENCOUNTER — Ambulatory Visit: Payer: PRIVATE HEALTH INSURANCE | Admitting: Sports Medicine

## 2016-09-13 ENCOUNTER — Ambulatory Visit (INDEPENDENT_AMBULATORY_CARE_PROVIDER_SITE_OTHER): Payer: PRIVATE HEALTH INSURANCE | Admitting: Sports Medicine

## 2016-09-13 DIAGNOSIS — R635 Abnormal weight gain: Secondary | ICD-10-CM | POA: Diagnosis not present

## 2016-09-13 MED ORDER — TOPIRAMATE 25 MG PO TABS
ORAL_TABLET | ORAL | 3 refills | Status: DC
Start: 2016-09-13 — End: 2016-10-11

## 2016-09-13 MED ORDER — PHENTERMINE HCL 37.5 MG PO TABS: ORAL_TABLET | ORAL | 0 refills | Status: DC

## 2016-09-13 NOTE — Assessment & Plan Note (Signed)
Only 3 more pounds. Continue phentermine, we're entering the fourth month, she did have some difficulty with Topamax in the past, I'm going to add it again but at 12.5 mg at bedtime for the first week.

## 2016-09-13 NOTE — Progress Notes (Signed)
  Subjective:    CC: Follow-up  HPI: Abnormal weight gain: Still had some weight loss but minimal. Did have some issues with Topamax in the recent past. Agrees to try again.  Past medical history:  Negative.  See flowsheet/record as well for more information.  Surgical history: Negative.  See flowsheet/record as well for more information.  Family history: Negative.  See flowsheet/record as well for more information.  Social history: Negative.  See flowsheet/record as well for more information.  Allergies, and medications have been entered into the medical record, reviewed, and no changes needed.   Review of Systems: No fevers, chills, night sweats, weight loss, chest pain, or shortness of breath.   Objective:    General: Well Developed, well nourished, and in no acute distress.  Neuro: Alert and oriented x3, extra-ocular muscles intact, sensation grossly intact.  HEENT: Normocephalic, atraumatic, pupils equal round reactive to light, neck supple, no masses, no lymphadenopathy, thyroid nonpalpable.  Skin: Warm and dry, no rashes. Cardiac: Regular rate and rhythm, no murmurs rubs or gallops, no lower extremity edema.  Respiratory: Clear to auscultation bilaterally. Not using accessory muscles, speaking in full sentences.  Impression and Recommendations:    Abnormal weight gain Only 3 more pounds. Continue phentermine, we're entering the fourth month, she did have some difficulty with Topamax in the past, I'm going to add it again but at 12.5 mg at bedtime for the first week.  I spent 25 minutes with this patient, greater than 50% was face-to-face time counseling regarding the above diagnoses ___________________________________________ Ihor Austinhomas J. Benjamin Stainhekkekandam, M.D., ABFM., CAQSM. Primary Care and Sports Medicine [facility]  Adjunct Instructor of Family Medicine  [employer]

## 2016-09-17 ENCOUNTER — Other Ambulatory Visit: Payer: Self-pay | Admitting: Sports Medicine

## 2016-10-11 ENCOUNTER — Encounter: Payer: Self-pay | Admitting: Sports Medicine

## 2016-10-11 ENCOUNTER — Ambulatory Visit (INDEPENDENT_AMBULATORY_CARE_PROVIDER_SITE_OTHER): Payer: PRIVATE HEALTH INSURANCE | Admitting: Sports Medicine

## 2016-10-11 VITALS — BP 131/84 | HR 100 | Temp 97.7°F | Ht 65.0 in | Wt 149.0 lb

## 2016-10-11 DIAGNOSIS — N3001 Acute cystitis with hematuria: Secondary | ICD-10-CM

## 2016-10-11 DIAGNOSIS — R635 Abnormal weight gain: Secondary | ICD-10-CM | POA: Diagnosis not present

## 2016-10-11 DIAGNOSIS — R3915 Urgency of urination: Secondary | ICD-10-CM

## 2016-10-11 DIAGNOSIS — R319 Hematuria, unspecified: Secondary | ICD-10-CM | POA: Diagnosis not present

## 2016-10-11 DIAGNOSIS — N3000 Acute cystitis without hematuria: Secondary | ICD-10-CM | POA: Insufficient documentation

## 2016-10-11 DIAGNOSIS — R1033 Periumbilical pain: Secondary | ICD-10-CM | POA: Diagnosis not present

## 2016-10-11 LAB — POCT URINALYSIS DIPSTICK
Glucose, UA: NEGATIVE
Ketones, UA: 15
Leukocytes, UA: NEGATIVE
Nitrite, UA: NEGATIVE
Protein, UA: 30
Spec Grav, UA: 1.015 (ref 1.010–1.025)
Urobilinogen, UA: 1 E.U./dL
pH, UA: 7 (ref 5.0–8.0)

## 2016-10-11 MED ORDER — ONDANSETRON 8 MG PO TBDP: 8.0000 mg | ORAL_TABLET | Freq: Three times a day (TID) | ORAL | 3 refills | Status: DC | PRN

## 2016-10-11 MED ORDER — FLUCONAZOLE 150 MG PO TABS: 150.0000 mg | ORAL_TABLET | Freq: Once | ORAL | 0 refills | Status: AC

## 2016-10-11 MED ORDER — NITROFURANTOIN MONOHYD MACRO 100 MG PO CAPS: 100.0000 mg | ORAL_CAPSULE | Freq: Two times a day (BID) | ORAL | 0 refills | Status: DC

## 2016-10-11 MED ORDER — PHENAZOPYRIDINE HCL 200 MG PO TABS: 200.0000 mg | ORAL_TABLET | Freq: Three times a day (TID) | ORAL | 0 refills | Status: AC

## 2016-10-11 MED ORDER — PHENTERMINE HCL 37.5 MG PO TABS: ORAL_TABLET | ORAL | 0 refills | Status: DC

## 2016-10-11 NOTE — Progress Notes (Signed)
  Subjective:    CC: Weight check  HPI: Normal weight gain: Additional 3 pound weight loss, tolerating Topamax appropriately.  Suprapubic pain: Has been having a ton of sexual intercourse, has developed dysuria, suprapubic pressure, mild back pain, and malaise.  Past medical history:  Negative.  See flowsheet/record as well for more information.  Surgical history: Negative.  See flowsheet/record as well for more information.  Family history: Negative.  See flowsheet/record as well for more information.  Social history: Negative.  See flowsheet/record as well for more information.  Allergies, and medications have been entered into the medical record, reviewed, and no changes needed.   Review of Systems: No fevers, chills, night sweats, weight loss, chest pain, or shortness of breath.   Objective:    General: Well Developed, well nourished, and in no acute distress.  Neuro: Alert and oriented x3, extra-ocular muscles intact, sensation grossly intact.  HEENT: Normocephalic, atraumatic, pupils equal round reactive to light, neck supple, no masses, no lymphadenopathy, thyroid nonpalpable.  Skin: Warm and dry, no rashes. Cardiac: Regular rate and rhythm, no murmurs rubs or gallops, no lower extremity edema.  Respiratory: Clear to auscultation bilaterally. Not using accessory muscles, speaking in full sentences. Abdomen: Soft, tender to palpation in the superpubic region, nondistended, normal bowel sounds, no palpable masses, no guarding, rigidity, rebound pain, no costovertebral angle pain.  Urinalysis is positive for blood, bilirubin, protein, ketones, relatively high specific gravity. Sent for culture  Impression and Recommendations:    Abnormal weight gain Additional 3 pound weight loss, entering the fifth month.  Did well with 12.5 mg Topamax, increasing to 25 mg. Ultimately we will go up to 50 mg at bedtime.  Acute cystitis Has been having loss of sex. Classic cystitis symptoms,  adding Macrobid. Diflucan. Urine culture. Does have significant malaise. Also adding Pyridium for immediate symptomatic relief. ___________________________________________ Alicia Sanders. Benjamin Stain, M.D., ABFM., CAQSM. Primary Care and Sports Medicine [facility]  Adjunct Instructor of Family Medicine  [employer]

## 2016-10-11 NOTE — Assessment & Plan Note (Signed)
Additional 3 pound weight loss, entering the fifth month.  Did well with 12.5 mg Topamax, increasing to 25 mg. Ultimately we will go up to 50 mg at bedtime.

## 2016-10-11 NOTE — Assessment & Plan Note (Signed)
Has been having loss of sex. Classic cystitis symptoms, adding Macrobid. Diflucan. Urine culture. Does have significant malaise. Also adding Pyridium for immediate symptomatic relief.

## 2016-10-12 ENCOUNTER — Other Ambulatory Visit: Payer: Self-pay | Admitting: *Deleted

## 2016-10-12 MED ORDER — ONDANSETRON 8 MG PO TBDP: 8.0000 mg | ORAL_TABLET | Freq: Three times a day (TID) | ORAL | 3 refills | Status: DC | PRN

## 2016-10-13 LAB — URINE CULTURE
MICRO NUMBER:: 81137356
SPECIMEN QUALITY:: ADEQUATE

## 2016-11-08 ENCOUNTER — Encounter: Payer: Self-pay | Admitting: Sports Medicine

## 2016-11-08 ENCOUNTER — Ambulatory Visit: Payer: PRIVATE HEALTH INSURANCE | Admitting: Sports Medicine

## 2016-11-08 DIAGNOSIS — R635 Abnormal weight gain: Secondary | ICD-10-CM | POA: Diagnosis not present

## 2016-11-08 DIAGNOSIS — M5030 Other cervical disc degeneration, unspecified cervical region: Secondary | ICD-10-CM

## 2016-11-08 MED ORDER — PHENTERMINE HCL 37.5 MG PO TABS: ORAL_TABLET | ORAL | 0 refills | Status: DC

## 2016-11-08 MED ORDER — TOPIRAMATE 50 MG PO TABS: 50.0000 mg | ORAL_TABLET | Freq: Every day | ORAL | 3 refills | Status: DC

## 2016-11-08 NOTE — Progress Notes (Signed)
  Subjective:    CC: Follow-up  HPI: Abnormal weight gain: 2 pound weight loss over the last month, entering the last month of phentermine, she is tolerating Topamax at 25 mg.  Neck pain: Right-sided, known C3-C4 protruding disc, has never done physical therapy.  Nothing radicular, no weakness, no trauma, no constitutional symptoms.  Past medical history:  Negative.  See flowsheet/record as well for more information.  Surgical history: Negative.  See flowsheet/record as well for more information.  Family history: Negative.  See flowsheet/record as well for more information.  Social history: Negative.  See flowsheet/record as well for more information.  Allergies, and medications have been entered into the medical record, reviewed, and no changes needed.   Review of Systems: No fevers, chills, night sweats, weight loss, chest pain, or shortness of breath.   Objective:    General: Well Developed, well nourished, and in no acute distress.  Neuro: Alert and oriented x3, extra-ocular muscles intact, sensation grossly intact.  HEENT: Normocephalic, atraumatic, pupils equal round reactive to light, neck supple, no masses, no lymphadenopathy, thyroid nonpalpable.  Skin: Warm and dry, no rashes. Cardiac: Regular rate and rhythm, no murmurs rubs or gallops, no lower extremity edema.  Respiratory: Clear to auscultation bilaterally. Not using accessory muscles, speaking in full sentences.  Impression and Recommendations:    Abnormal weight gain 2 pound weight loss, entering the sixth month. Increasing Topamax to 50 mg at night, single last fill of phentermine, when she returns we will do the half dose as a down titration over 3 months. We would continue Topamax.  DDD (degenerative disc disease), cervical Mild disc protrusion at C3-C4. Adding aggressive formal physical therapy.  I spent 25 minutes with this patient, greater than 50% was face-to-face time counseling regarding the above  diagnoses ___________________________________________ Ihor Austinhomas J. Benjamin Stainhekkekandam, M.D., ABFM., CAQSM. Primary Care and Sports Medicine  [facility]  Adjunct Instructor of Family Medicine  [employer]

## 2016-11-08 NOTE — Assessment & Plan Note (Signed)
Mild disc protrusion at C3-C4. Adding aggressive formal physical therapy.

## 2016-11-08 NOTE — Assessment & Plan Note (Signed)
2 pound weight loss, entering the sixth month. Increasing Topamax to 50 mg at night, single last fill of phentermine, when she returns we will do the half dose as a down titration over 3 months. We would continue Topamax.

## 2016-11-09 ENCOUNTER — Encounter: Payer: Self-pay | Admitting: Sports Medicine

## 2016-11-09 MED ORDER — TRAMADOL HCL 50 MG PO TABS: 50.0000 mg | ORAL_TABLET | Freq: Three times a day (TID) | ORAL | 0 refills | Status: DC | PRN

## 2016-11-09 MED ORDER — CYCLOBENZAPRINE HCL 10 MG PO TABS: ORAL_TABLET | ORAL | 0 refills | Status: DC

## 2016-12-06 ENCOUNTER — Ambulatory Visit: Payer: PRIVATE HEALTH INSURANCE | Attending: Sports Medicine

## 2016-12-06 ENCOUNTER — Ambulatory Visit: Payer: PRIVATE HEALTH INSURANCE

## 2016-12-06 DIAGNOSIS — R293 Abnormal posture: Secondary | ICD-10-CM | POA: Diagnosis present

## 2016-12-06 DIAGNOSIS — R252 Cramp and spasm: Secondary | ICD-10-CM | POA: Diagnosis present

## 2016-12-06 DIAGNOSIS — M542 Cervicalgia: Secondary | ICD-10-CM | POA: Insufficient documentation
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
## 2016-12-06 NOTE — Patient Instructions (Addendum)
Issued form cabinet  Cross postural syndrome 1-3 x per day, 3-10 reps.   Posture ed and cervial retraction

## 2016-12-07 ENCOUNTER — Ambulatory Visit (INDEPENDENT_AMBULATORY_CARE_PROVIDER_SITE_OTHER): Payer: PRIVATE HEALTH INSURANCE | Admitting: Sports Medicine

## 2016-12-07 DIAGNOSIS — R635 Abnormal weight gain: Secondary | ICD-10-CM | POA: Diagnosis not present

## 2016-12-07 DIAGNOSIS — M5030 Other cervical disc degeneration, unspecified cervical region: Secondary | ICD-10-CM

## 2016-12-07 DIAGNOSIS — L301 Dyshidrosis [pompholyx]: Secondary | ICD-10-CM | POA: Diagnosis not present

## 2016-12-07 MED ORDER — TOPIRAMATE 50 MG PO TABS: 50.0000 mg | ORAL_TABLET | Freq: Two times a day (BID) | ORAL | 0 refills | Status: DC

## 2016-12-07 MED ORDER — PHENTERMINE HCL 37.5 MG PO TABS: 18.7500 mg | ORAL_TABLET | Freq: Every day | ORAL | 0 refills | Status: DC

## 2016-12-07 MED ORDER — DIAZEPAM 5 MG PO TABS: ORAL_TABLET | ORAL | 0 refills | Status: DC

## 2016-12-07 NOTE — Assessment & Plan Note (Signed)
There has been an additional 2 pound weight loss, we have now finished 6 months of phentermine, Topamax. Refilling phentermine, one half tab for 3 more months. Continue Topamax, but increasing to twice a day.

## 2016-12-07 NOTE — Assessment & Plan Note (Signed)
At this point we are going to proceed with a cervical epidural.  Persistent right-sided periscapular symptoms. She does have a C3-C4 protruding disc with mild indentation of the anterior thecal sac. Some Valium for preprocedural anxiolysis.

## 2016-12-07 NOTE — Progress Notes (Signed)
  Subjective:    CC: Weight check  HPI: Alicia Sanders returns, she is lost an additional 2 pounds, we did at the 5582-month point.  Neck pain: With cervical degenerative disc disease, right periscapular radicular pain.  She is failed physical therapy, NSAIDs, steroids, muscle relaxers.  Pain is moderate, persistent.  Radiation as above.  Worsening.  Dyshidrotic eczema: Persistent, she has been wearing gloves with her steroid cream, but unfortunately these gloves have been cotton.  Understands that she will use nitrile gloves for better occlusion.  Past medical history:  Negative.  See flowsheet/record as well for more information.  Surgical history: Negative.  See flowsheet/record as well for more information.  Family history: Negative.  See flowsheet/record as well for more information.  Social history: Negative.  See flowsheet/record as well for more information.  Allergies, and medications have been entered into the medical record, reviewed, and no changes needed.   (To billers/coders, pertinent past medical, social, surgical, family history can be found in problem list, if problem list is marked as reviewed then this indicates that past medical, social, surgical, family history was also reviewed)  Review of Systems: No fevers, chills, night sweats, weight loss, chest pain, or shortness of breath.   Objective:    General: Well Developed, well nourished, and in no acute distress.  Neuro: Alert and oriented x3, extra-ocular muscles intact, sensation grossly intact.  HEENT: Normocephalic, atraumatic, pupils equal round reactive to light, neck supple, no masses, no lymphadenopathy, thyroid nonpalpable.  Skin: Warm and dry, no rashes. Cardiac: Regular rate and rhythm, no murmurs rubs or gallops, no lower extremity edema.  Respiratory: Clear to auscultation bilaterally. Not using accessory muscles, speaking in full sentences.  Impression and Recommendations:    Abnormal weight gain There has  been an additional 2 pound weight loss, we have now finished 6 months of phentermine, Topamax. Refilling phentermine, one half tab for 3 more months. Continue Topamax, but increasing to twice a day.   Dyshidrotic eczema Needs to continue the topical steroids but she is not using occlusive gloves, she will switch to an occlusive glove and wear it at night.  DDD (degenerative disc disease), cervical At this point we are going to proceed with a cervical epidural.  Persistent right-sided periscapular symptoms. She does have a C3-C4 protruding disc with mild indentation of the anterior thecal sac. Some Valium for preprocedural anxiolysis.  I spent 25 minutes with this patient, greater than 50% was face-to-face time counseling regarding the above diagnoses ___________________________________________ Ihor Austinhomas J. Benjamin Stainhekkekandam, M.D., ABFM., CAQSM. Primary Care and Sports Medicine [facility] [place]  Adjunct Instructor of Family Medicine  [employer]

## 2016-12-07 NOTE — Assessment & Plan Note (Signed)
Needs to continue the topical steroids but she is not using occlusive gloves, she will switch to an occlusive glove and wear it at night.

## 2016-12-08 ENCOUNTER — Other Ambulatory Visit: Payer: Self-pay | Admitting: Sports Medicine

## 2016-12-17 ENCOUNTER — Encounter: Payer: Self-pay | Admitting: Sports Medicine

## 2016-12-18 ENCOUNTER — Ambulatory Visit: Payer: PRIVATE HEALTH INSURANCE | Admitting: Physical Therapy

## 2016-12-18 DIAGNOSIS — M542 Cervicalgia: Secondary | ICD-10-CM | POA: Diagnosis not present

## 2016-12-18 DIAGNOSIS — R252 Cramp and spasm: Secondary | ICD-10-CM

## 2016-12-18 DIAGNOSIS — R293 Abnormal posture: Secondary | ICD-10-CM

## 2016-12-19 NOTE — Therapy (Addendum)
[facility] [address] [place], [place] [place], [address] Phone: [phone]   Fax:  [phone]  Physical Therapy Treatment  Patient Details  Name: Alicia Sanders MRN: [id number] Date of Birth: 11-19-1980 Referring Provider: Rodney Langtonhomas Thekkekandam   Encounter Date: 12/18/2016  PT End of Session - 12/19/16 1222    Visit Number  2    Number of Visits  12    Date for PT Re-Evaluation  01/18/17    PT Start Time  1330    PT Stop Time  1414    PT Time Calculation (min)  44 min    Activity Tolerance  Patient tolerated treatment well    Behavior During Therapy  [facility] for tasks assessed/performed       Past Medical History:  Diagnosis Date  . Back problem   . Thyroid disease     Past Surgical History:  Procedure Laterality Date  . HAND SURGERY Right     There were no vitals filed for this visit.  Subjective Assessment - 12/19/16 0835    Subjective  Continues to have headaches and neck pain. She has been doing her exercises. No real change since the intial eval.     Currently in Pain?  Yes    Pain Score  5     Pain Location  Neck    Pain Orientation  Right;Left    Pain Descriptors / Indicators  Aching;Tightness    Pain Type  Chronic pain    Pain Onset  More than a month ago    Pain Frequency  Constant    Aggravating Factors   looking up, lifting, using the right arm     Pain Relieving Factors  heat/ cold                       [facility] Adult PT Treatment/Exercise - 12/19/16 0001      Manual Therapy   Manual Therapy  Manual Traction;Myofascial release;Soft tissue mobilization    Myofascial Release  IASTYM to upper traps and cerical spine; Reviewed self soft tissue mobilization with shepards hook and tennis ball     Manual Traction  gentle manual traction and sub occipital release        Trigger Point Dry Needling - 12/19/16 1411    Consent Given?  Yes    Education Handout Provided  Yes    Muscles Treated  Upper Body  Upper trapezius;Longissimus    Upper Trapezius Response  Twitch reponse elicited    Longissimus Response  Twitch response elicited           PT Education - 12/19/16 1222    Education provided  Yes    Education Details  reviewed HEP; TPDN benefits and risks; dealing with psot needling soreness.     Person(s) Educated  Patient    Methods  Explanation;Demonstration;Tactile cues;Verbal cues    Comprehension  Returned demonstration;Verbal cues required;Verbalized understanding;Tactile cues required       PT Short Term Goals - 12/19/16 1236      PT SHORT TERM GOAL #1   Title  She will be independent with initial HEP     Time  2    Period  Weeks    Status  On-going      PT SHORT TERM GOAL #2   Title  She will demo understanding of good posture    Time  2    Period  Weeks    Status  On-going  PT SHORT TERM GOAL #3   Title  She will report pain in neck decr 30% or more    Time  2    Period  Weeks    Status  On-going        PT Long Term Goals - 12/06/16 0702      PT LONG TERM GOAL #1   Title  She will be independent with all hEp ssued     Time  6    Period  Weeks    Status  New      PT LONG TERM GOAL #2   Title  She will report pain decr 75% or more and be intermittant    Time  6    Period  Weeks    Status  New      PT LONG TERM GOAL #3   Baseline  She will report  decreased frequency or intensity of headaches.     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     Time  6    Period  Weeks    Status  New            Plan - 12/19/16 1223    Clinical Impression Statement  Patient had a great twitch respone inher right upper trap and good twitch resposes in her lower cervical spine and around  c3-c4. She reported feeling like she could turn her head further after treatment. Therapy also reviewed self soft tissue mobilization. She was advised to perfrom scapular retractions and sttretching given by PT last visit at home.      Clinical Presentation  Stable    Clinical Decision Making  Low    Rehab Potential  Good    PT Frequency  2x / week    PT Duration  6 weeks    PT Treatment/Interventions  Iontophoresis 4mg /ml Dexamethasone;Ultrasound;Traction;Moist Heat;Therapeutic exercise;Passive range of motion;Patient/family education;Manual techniques;Dry needling    PT Next Visit Plan  Traction , HEP review , manual , modalities    PT Home Exercise Plan  posture , cerv retraction, ROM sidebend and levator and scapula retraction    Consulted and Agree with Plan of Care  Patient       Patient will benefit from skilled therapeutic intervention in order to improve the following deficits and impairments:  Increased muscle spasms, Impaired UE functional use, Decreased activity tolerance, Decreased range of motion, Pain, Postural dysfunction  Visit Diagnosis: Cervicalgia  Cramp and spasm  Abnormal posture     Problem List Patient Active Problem List   Diagnosis Date Noted  . Acute cystitis 10/11/2016  . Other viral warts 06/26/2016  . Overweight (BMI 25.0-29.9) 06/25/2016  . Dysphagia 01/27/2016  . Mood disorder (HCC) 01/27/2016  . DDD (degenerative disc disease), cervical 01/27/2016  . Pleuritic chest pain 01/27/2016  . 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 Abnormal weight gain 04/18/2015  . Preventive measure 05/08/2012  . DDD (degenerative disc disease), lumbar 05/08/2012  . Hypothyroidism 07/11/2010  . BREAST CYST 01/19/2009  . Non-seasonal allergic rhinitis 11/26/2007  Dessie Comaavid J Albion Weatherholtz PT DPT  12/19/2016, 2:11 PM  [facility] [address] [place], [place], [address] Phone: [phone]   Fax:  [phone]  Name:  Alicia Sanders MRN: [id number] Date of Birth: May 31, 1980

## 2016-12-27 ENCOUNTER — Ambulatory Visit: Payer: PRIVATE HEALTH INSURANCE | Admitting: Physical Therapy

## 2016-12-27 ENCOUNTER — Encounter: Payer: Self-pay | Admitting: Sports Medicine

## 2017-01-02 ENCOUNTER — Ambulatory Visit: Payer: PRIVATE HEALTH INSURANCE

## 2017-01-03 ENCOUNTER — Ambulatory Visit: Payer: PRIVATE HEALTH INSURANCE | Attending: Sports Medicine | Admitting: Physical Therapy

## 2017-01-03 DIAGNOSIS — R293 Abnormal posture: Secondary | ICD-10-CM | POA: Insufficient documentation

## 2017-01-03 DIAGNOSIS — R252 Cramp and spasm: Secondary | ICD-10-CM

## 2017-01-03 DIAGNOSIS — M542 Cervicalgia: Secondary | ICD-10-CM | POA: Insufficient documentation
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
## 2017-01-05 MED ORDER — PREDNISONE 10 MG (48) PO TBPK: ORAL_TABLET | Freq: Every day | ORAL | 0 refills | Status: DC

## 2017-01-07 ENCOUNTER — Encounter: Payer: PRIVATE HEALTH INSURANCE | Admitting: Physical Therapy

## 2017-01-09 ENCOUNTER — Encounter: Payer: PRIVATE HEALTH INSURANCE | Admitting: Physical Therapy

## 2017-01-11 ENCOUNTER — Other Ambulatory Visit: Payer: PRIVATE HEALTH INSURANCE

## 2017-01-15 ENCOUNTER — Encounter: Payer: Self-pay | Admitting: Sports Medicine

## 2017-01-15 MED ORDER — BENZONATATE 200 MG PO CAPS: 200.0000 mg | ORAL_CAPSULE | Freq: Three times a day (TID) | ORAL | 0 refills | Status: DC | PRN

## 2017-01-17 ENCOUNTER — Encounter: Payer: Self-pay | Admitting: Sports Medicine

## 2017-01-21 MED ORDER — HYDROCOD POLST-CPM POLST ER 10-8 MG/5ML PO SUER: 5.0000 mL | Freq: Two times a day (BID) | ORAL | 0 refills | Status: DC | PRN

## 2017-01-23 ENCOUNTER — Ambulatory Visit (INDEPENDENT_AMBULATORY_CARE_PROVIDER_SITE_OTHER): Payer: PRIVATE HEALTH INSURANCE

## 2017-01-23 ENCOUNTER — Ambulatory Visit (INDEPENDENT_AMBULATORY_CARE_PROVIDER_SITE_OTHER): Payer: PRIVATE HEALTH INSURANCE | Admitting: Sports Medicine

## 2017-01-23 ENCOUNTER — Encounter: Payer: Self-pay | Admitting: Sports Medicine

## 2017-01-23 DIAGNOSIS — R05 Cough: Secondary | ICD-10-CM

## 2017-01-23 DIAGNOSIS — R059 Cough, unspecified: Secondary | ICD-10-CM | POA: Insufficient documentation

## 2017-01-23 DIAGNOSIS — R509 Fever, unspecified: Secondary | ICD-10-CM

## 2017-01-23 HISTORY — DX: Cough: R05

## 2017-01-23 HISTORY — DX: Cough, unspecified: R05.9

## 2017-01-23 MED ORDER — PREDNISONE 50 MG PO TABS: ORAL_TABLET | ORAL | 0 refills | Status: DC

## 2017-01-23 MED ORDER — AZITHROMYCIN 250 MG PO TABS: ORAL_TABLET | ORAL | 0 refills | Status: DC

## 2017-01-23 NOTE — Progress Notes (Signed)
  Subjective:    CC: Still feeling sick  HPI: For a week and a half Alicia Sanders has increased increasing cough, fatigue, myalgias.  Subjective fevers and chills.  Sore throat.  Multiple sick contacts as work, she works as an Garment/textile technologistx-ray technologist.  Symptoms are moderate, persistent.  No shortness of breath, nausea, vomiting, diarrhea.  Significant fullness in the right greater than left ear.  I reviewed the past medical history, family history, social history, surgical history, and allergies today and no changes were needed.  Please see the problem list section below in epic for further details.  Past Medical History: Past Medical History:  Diagnosis Date  . Back problem   . Thyroid disease    Past Surgical History: Past Surgical History:  Procedure Laterality Date  . HAND SURGERY Right    Social History: Social History   Socioeconomic History  . Marital status: Married    Spouse name: None  . Number of children: None  . Years of education: None  . Highest education level: None  Social Needs  . Financial resource strain: None  . Food insecurity - worry: None  . Food insecurity - inability: None  . Transportation needs - medical: None  . Transportation needs - non-medical: None  Occupational History  . None  Tobacco Use  . Smoking status: Never Smoker  . Smokeless tobacco: Never Used  Substance and Sexual Activity  . Alcohol use: None  . Drug use: None  . Sexual activity: None  Other Topics Concern  . None  Social History Narrative  . None   Family History: No family history on file. Allergies: Allergies  Allergen Reactions  . Diflucan [Fluconazole] Diarrhea    severe  . Thimerosal Swelling and Other (See Comments)    Blisters and swelling at injection site; significant malaise.  CAN tolerate preservative-free flu vaccine (10/24/16)   Medications: See med rec.  Review of Systems: No fevers, chills, night sweats, weight loss, chest pain, or shortness of breath.    Objective:    General: Well Developed, well nourished, and in no acute distress.  Neuro: Alert and oriented x3, extra-ocular muscles intact, sensation grossly intact.  HEENT: Normocephalic, atraumatic, pupils equal round reactive to light, neck supple, no masses, no lymphadenopathy, thyroid nonpalpable.  Oropharynx, nasopharynx, ear canals unremarkable. Skin: Warm and dry, no rashes. Cardiac: Regular rate and rhythm, no murmurs rubs or gallops, no lower extremity edema.  Respiratory: Moderate coughing in the exam room, somewhat coarse/bronchial sounds throughout. Not using accessory muscles, speaking in full sentences.  Rapid flu test is Negative  Impression and Recommendations:    Cough 1-1/2 weeks, persistent, not controlled with Tussionex. Slightly coarse sounds on chest auscultation. Significant myalgias and fatigue. Flu swab, chest x-ray, prednisone, azithromycin. Continue Tussionex and out of work until next week. ___________________________________________ Ihor Austinhomas J. Benjamin Stainhekkekandam, M.D., ABFM., CAQSM. Primary Care and Sports Medicine [facility]  Adjunct Instructor of Family Medicine  [employer]

## 2017-01-23 NOTE — Addendum Note (Signed)
Addended by: Monica BectonHEKKEKANDAM, THOMAS J on: 01/23/2017 10:07 AM   Modules accepted: Orders

## 2017-01-23 NOTE — Assessment & Plan Note (Signed)
1-1/2 weeks, persistent, not controlled with Tussionex. Slightly coarse sounds on chest auscultation. Significant myalgias and fatigue. Flu swab, chest x-ray, prednisone, azithromycin. Continue Tussionex and out of work until next week.

## 2017-01-24 ENCOUNTER — Encounter: Payer: Self-pay | Admitting: Sports Medicine

## 2017-01-24 MED ORDER — HYDROCOD POLST-CPM POLST ER 10-8 MG/5ML PO SUER: 5.0000 mL | Freq: Two times a day (BID) | ORAL | 0 refills | Status: DC | PRN

## 2017-01-28 ENCOUNTER — Ambulatory Visit (INDEPENDENT_AMBULATORY_CARE_PROVIDER_SITE_OTHER): Payer: PRIVATE HEALTH INSURANCE | Admitting: Sports Medicine

## 2017-01-28 ENCOUNTER — Ambulatory Visit
Admission: RE | Admit: 2017-01-28 | Discharge: 2017-01-28 | Disposition: A | Discharge status: Home / Self Care | Payer: PRIVATE HEALTH INSURANCE | Source: Ambulatory Visit | Attending: Sports Medicine | Admitting: Sports Medicine

## 2017-01-28 DIAGNOSIS — R1031 Right lower quadrant pain: Secondary | ICD-10-CM | POA: Diagnosis not present

## 2017-01-28 DIAGNOSIS — R1084 Generalized abdominal pain: Secondary | ICD-10-CM

## 2017-01-28 DIAGNOSIS — R1032 Left lower quadrant pain: Secondary | ICD-10-CM | POA: Insufficient documentation

## 2017-01-28 LAB — POCT URINALYSIS DIPSTICK
Bilirubin, UA: NEGATIVE
Blood, UA: NEGATIVE
Glucose, UA: NEGATIVE
Ketones, UA: NEGATIVE
Leukocytes, UA: NEGATIVE
Nitrite, UA: NEGATIVE
Protein, UA: NEGATIVE
Spec Grav, UA: 1.015 (ref 1.010–1.025)
Urobilinogen, UA: 0.2 E.U./dL
pH, UA: 7 (ref 5.0–8.0)

## 2017-01-28 LAB — POCT URINE PREGNANCY: Preg Test, Ur: NEGATIVE

## 2017-01-28 MED ORDER — IOPAMIDOL (ISOVUE-300) INJECTION 61%
100.0000 mL | Freq: Once | INTRAVENOUS | Status: AC | PRN
  Administered 2017-01-28: 100 mL via INTRAVENOUS

## 2017-01-28 NOTE — Assessment & Plan Note (Signed)
With guarding and rigidity. Fevers, chills, fatigue. CT abdomen and pelvis looking for acute appendicitis. Certainly pelvic pathology could be there but her symptoms are more at McBurney's point. Stat CBC, CMP, amylase, lipase, blood cultures. Urinalysis was negative. Stat CT abdomen and pelvis with oral and IV contrast.

## 2017-01-28 NOTE — Progress Notes (Signed)
  Subjective:    CC: Feeling sick still  HPI: 4 weeks now Alicia Sanders has had fatigue, malaise, muscle aches and body aches with sweats, it is declaring itself now with some right lower quadrant abdominal pain without nausea, vomiting, diarrhea.  Mild pain in her chest when she takes deep breaths.  We have tried several courses of antibiotics, steroids without much improvement.  I reviewed the past medical history, family history, social history, surgical history, and allergies today and no changes were needed.  Please see the problem list section below in epic for further details.  Past Medical History: Past Medical History:  Diagnosis Date  . Back problem   . Thyroid disease    Past Surgical History: Past Surgical History:  Procedure Laterality Date  . HAND SURGERY Right    Social History: Social History   Socioeconomic History  . Marital status: Married    Spouse name: Not on file  . Number of children: Not on file  . Years of education: Not on file  . Highest education level: Not on file  Social Needs  . Financial resource strain: Not on file  . Food insecurity - worry: Not on file  . Food insecurity - inability: Not on file  . Transportation needs - medical: Not on file  . Transportation needs - non-medical: Not on file  Occupational History  . Not on file  Tobacco Use  . Smoking status: Never Smoker  . Smokeless tobacco: Never Used  Substance and Sexual Activity  . Alcohol use: Not on file  . Drug use: Not on file  . Sexual activity: Not on file  Other Topics Concern  . Not on file  Social History Narrative  . Not on file   Family History: No family history on file. Allergies: Allergies  Allergen Reactions  . Diflucan [Fluconazole] Diarrhea    severe  . Thimerosal Swelling and Other (See Comments)    Blisters and swelling at injection site; significant malaise.  CAN tolerate preservative-free flu vaccine (10/24/16)   Medications: See med rec.  Review of  Systems: No fevers, chills, night sweats, weight loss, chest pain, or shortness of breath.   Objective:    General: Well Developed, well nourished, and in no acute distress.  Neuro: Alert and oriented x3, extra-ocular muscles intact, sensation grossly intact.  HEENT: Normocephalic, atraumatic, pupils equal round reactive to light, neck supple, no masses, no lymphadenopathy, thyroid nonpalpable.  Oropharynx, nasopharynx, ear canals unremarkable Skin: Warm and dry, no rashes. Cardiac: Regular rate and rhythm, no murmurs rubs or gallops, no lower extremity edema.  Respiratory: Clear to auscultation bilaterally. Not using accessory muscles, speaking in full sentences. Abdomen: Soft, tender to palpation in the right lower quadrant near McBurney's point, nondistended, normal bowel sounds, no palpable masses, there is significant guarding, minimal rigidity, no rebound pain.  Urinalysis is clear  Impression and Recommendations:    Acute abdominal pain in right lower quadrant With guarding and rigidity. Fevers, chills, fatigue. CT abdomen and pelvis looking for acute appendicitis. Certainly pelvic pathology could be there but her symptoms are more at McBurney's point. Stat CBC, CMP, amylase, lipase, blood cultures. Urinalysis was negative. Stat CT abdomen and pelvis with oral and IV contrast.  ___________________________________________ Ihor Austinhomas J. Benjamin Stainhekkekandam, M.D., ABFM., CAQSM. Primary Care and Sports Medicine  [facility]  Adjunct Instructor of Family Medicine  [employer]

## 2017-01-29 ENCOUNTER — Other Ambulatory Visit: Payer: Self-pay

## 2017-01-29 LAB — CBC WITH DIFFERENTIAL/PLATELET
Basophils Absolute: 32 cells/uL (ref 0–200)
Basophils Relative: 0.3 %
Eosinophils Absolute: 97 cells/uL (ref 15–500)
Eosinophils Relative: 0.9 %
HCT: 41.6 % (ref 35.0–45.0)
Hemoglobin: 14.2 g/dL (ref 11.7–15.5)
Lymphs Abs: 3812 cells/uL (ref 850–3900)
MCH: 29.6 pg (ref 27.0–33.0)
MCHC: 34.1 g/dL (ref 32.0–36.0)
MCV: 86.8 fL (ref 80.0–100.0)
MPV: 10.1 fL (ref 7.5–12.5)
Monocytes Relative: 4.1 %
Neutro Abs: 6415 cells/uL (ref 1500–7800)
Neutrophils Relative %: 59.4 %
Platelets: 268 10*3/uL (ref 140–400)
RBC: 4.79 10*6/uL (ref 3.80–5.10)
RDW: 12.3 % (ref 11.0–15.0)
Total Lymphocyte: 35.3 %
WBC mixed population: 443 cells/uL (ref 200–950)
WBC: 10.8 10*3/uL (ref 3.8–10.8)

## 2017-01-29 LAB — COMPREHENSIVE METABOLIC PANEL
AG Ratio: 1.5 (calc) (ref 1.0–2.5)
ALT: 26 U/L (ref 6–29)
AST: 15 U/L (ref 10–30)
Albumin: 4.4 g/dL (ref 3.6–5.1)
Alkaline phosphatase (APISO): 59 U/L (ref 33–115)
BUN: 9 mg/dL (ref 7–25)
CO2: 29 mmol/L (ref 20–32)
Calcium: 8.8 mg/dL (ref 8.6–10.2)
Chloride: 102 mmol/L (ref 98–110)
Creat: 0.72 mg/dL (ref 0.50–1.10)
Globulin: 2.9 g/dL (calc) (ref 1.9–3.7)
Glucose, Bld: 82 mg/dL (ref 65–99)
Potassium: 3.8 mmol/L (ref 3.5–5.3)
Sodium: 139 mmol/L (ref 135–146)
Total Bilirubin: 0.4 mg/dL (ref 0.2–1.2)
Total Protein: 7.3 g/dL (ref 6.1–8.1)

## 2017-01-29 LAB — LIPASE: Lipase: 16 U/L (ref 7–60)

## 2017-01-29 LAB — AMYLASE: Amylase: 45 U/L (ref 21–101)

## 2017-01-29 LAB — D-DIMER, QUANTITATIVE: D-Dimer, Quant: 0.28 mcg/mL FEU (ref <0.50)

## 2017-01-29 MED ORDER — HYDROCOD POLST-CPM POLST ER 10-8 MG/5ML PO SUER: 5.0000 mL | Freq: Two times a day (BID) | ORAL | 0 refills | Status: DC | PRN

## 2017-01-29 NOTE — Progress Notes (Signed)
Pt advised of lab results. Verbalized understanding.

## 2017-01-30 ENCOUNTER — Encounter: Payer: Self-pay | Admitting: Physician Assistant

## 2017-02-04 LAB — CULTURE BLOOD MANUAL
Micro Number: 90122304
Result: NO GROWTH
Specimen Quality: ADEQUATE

## 2017-02-08 ENCOUNTER — Encounter: Payer: Self-pay | Admitting: Sports Medicine

## 2017-02-11 ENCOUNTER — Encounter: Payer: Self-pay | Admitting: Sports Medicine

## 2017-02-11 DIAGNOSIS — R0781 Pleurodynia: Secondary | ICD-10-CM

## 2017-02-11 MED ORDER — ALBUTEROL SULFATE HFA 108 (90 BASE) MCG/ACT IN AERS: INHALATION_SPRAY | RESPIRATORY_TRACT | 11 refills | Status: AC

## 2017-02-20 ENCOUNTER — Encounter: Payer: Self-pay | Admitting: Sports Medicine

## 2017-02-20 ENCOUNTER — Other Ambulatory Visit: Payer: Self-pay | Admitting: Physician Assistant

## 2017-02-20 DIAGNOSIS — R05 Cough: Secondary | ICD-10-CM

## 2017-02-20 DIAGNOSIS — R059 Cough, unspecified: Secondary | ICD-10-CM

## 2017-02-20 MED ORDER — PREDNISONE 50 MG PO TABS: ORAL_TABLET | ORAL | 0 refills | Status: DC

## 2017-02-20 NOTE — Progress Notes (Signed)
a 

## 2017-02-21 NOTE — Telephone Encounter (Signed)
[facility].  I have been treating some of Alicia Sanders's medical problems but [facility]

## 2017-02-27 ENCOUNTER — Other Ambulatory Visit: Payer: Self-pay | Admitting: Sports Medicine

## 2017-02-27 DIAGNOSIS — F39 Unspecified mood [affective] disorder: Secondary | ICD-10-CM

## 2017-02-28 NOTE — Telephone Encounter (Signed)
To PCP

## 2017-03-04 ENCOUNTER — Other Ambulatory Visit: Payer: Self-pay | Admitting: *Deleted

## 2017-03-04 ENCOUNTER — Other Ambulatory Visit: Payer: Self-pay | Admitting: Sports Medicine

## 2017-03-04 ENCOUNTER — Telehealth: Payer: Self-pay | Admitting: *Deleted

## 2017-03-04 DIAGNOSIS — E039 Hypothyroidism, unspecified: Secondary | ICD-10-CM

## 2017-03-04 DIAGNOSIS — R635 Abnormal weight gain: Secondary | ICD-10-CM

## 2017-03-04 MED ORDER — LEVOTHYROXINE SODIUM 50 MCG PO TABS: 50.0000 ug | ORAL_TABLET | Freq: Every day | ORAL | 0 refills | Status: DC

## 2017-03-04 NOTE — Telephone Encounter (Signed)
TSH ordered.

## 2017-03-28 LAB — TSH: TSH: 1.93 mIU/L

## 2017-03-29 ENCOUNTER — Other Ambulatory Visit: Payer: Self-pay | Admitting: Physician Assistant

## 2017-03-29 MED ORDER — LEVOTHYROXINE SODIUM 50 MCG PO TABS: 50.0000 ug | ORAL_TABLET | Freq: Every day | ORAL | 3 refills | Status: DC

## 2017-03-29 NOTE — Telephone Encounter (Signed)
Thyroid looks great. I sent 1 year refill.

## 2017-04-07 ENCOUNTER — Encounter: Payer: Self-pay | Admitting: Physician Assistant

## 2017-04-07 DIAGNOSIS — F39 Unspecified mood [affective] disorder: Secondary | ICD-10-CM

## 2017-04-08 MED ORDER — BUPROPION HCL ER (XL) 300 MG PO TB24: 300.0000 mg | ORAL_TABLET | Freq: Every day | ORAL | 0 refills | Status: DC

## 2017-04-15 ENCOUNTER — Encounter: Payer: Self-pay | Admitting: Sports Medicine

## 2017-05-24 ENCOUNTER — Other Ambulatory Visit: Payer: Self-pay | Admitting: Sports Medicine

## 2017-07-09 ENCOUNTER — Encounter: Payer: Self-pay | Admitting: Physician Assistant

## 2017-07-09 MED ORDER — MONTELUKAST SODIUM 10 MG PO TABS: 10.0000 mg | ORAL_TABLET | Freq: Every day | ORAL | 3 refills | Status: DC

## 2017-07-10 ENCOUNTER — Other Ambulatory Visit: Payer: Self-pay | Admitting: Physician Assistant

## 2017-07-10 DIAGNOSIS — F39 Unspecified mood [affective] disorder: Secondary | ICD-10-CM

## 2017-07-23 ENCOUNTER — Encounter: Payer: Self-pay | Admitting: Physician Assistant

## 2017-07-24 ENCOUNTER — Ambulatory Visit (INDEPENDENT_AMBULATORY_CARE_PROVIDER_SITE_OTHER): Payer: PRIVATE HEALTH INSURANCE | Admitting: Physician Assistant

## 2017-07-24 ENCOUNTER — Ambulatory Visit (INDEPENDENT_AMBULATORY_CARE_PROVIDER_SITE_OTHER): Payer: PRIVATE HEALTH INSURANCE

## 2017-07-24 ENCOUNTER — Encounter: Payer: Self-pay | Admitting: Physician Assistant

## 2017-07-24 VITALS — BP 118/80 | HR 86 | Temp 98.2°F | Resp 14 | Wt 166.0 lb

## 2017-07-24 DIAGNOSIS — I880 Nonspecific mesenteric lymphadenitis: Secondary | ICD-10-CM

## 2017-07-24 DIAGNOSIS — Z975 Presence of (intrauterine) contraceptive device: Secondary | ICD-10-CM

## 2017-07-24 DIAGNOSIS — R1084 Generalized abdominal pain: Secondary | ICD-10-CM | POA: Diagnosis not present

## 2017-07-24 DIAGNOSIS — R310 Gross hematuria: Secondary | ICD-10-CM | POA: Diagnosis not present

## 2017-07-24 DIAGNOSIS — R112 Nausea with vomiting, unspecified: Secondary | ICD-10-CM | POA: Diagnosis not present

## 2017-07-24 DIAGNOSIS — K5900 Constipation, unspecified: Secondary | ICD-10-CM

## 2017-07-24 LAB — CBC WITH DIFFERENTIAL/PLATELET
Basophils Absolute: 27 cells/uL (ref 0–200)
Basophils Relative: 0.3 %
Eosinophils Absolute: 64 cells/uL (ref 15–500)
Eosinophils Relative: 0.7 %
HCT: 42.4 % (ref 35.0–45.0)
Hemoglobin: 14.7 g/dL (ref 11.7–15.5)
Lymphs Abs: 1911 cells/uL (ref 850–3900)
MCH: 29.2 pg (ref 27.0–33.0)
MCHC: 34.7 g/dL (ref 32.0–36.0)
MCV: 84.1 fL (ref 80.0–100.0)
MPV: 10.5 fL (ref 7.5–12.5)
Monocytes Relative: 4.1 %
Neutro Abs: 6725 cells/uL (ref 1500–7800)
Neutrophils Relative %: 73.9 %
Platelets: 261 10*3/uL (ref 140–400)
RBC: 5.04 10*6/uL (ref 3.80–5.10)
RDW: 12.9 % (ref 11.0–15.0)
Total Lymphocyte: 21 %
WBC mixed population: 373 cells/uL (ref 200–950)
WBC: 9.1 10*3/uL (ref 3.8–10.8)

## 2017-07-24 LAB — COMPREHENSIVE METABOLIC PANEL
AG Ratio: 1.8 (calc) (ref 1.0–2.5)
ALT: 13 U/L (ref 6–29)
AST: 11 U/L (ref 10–30)
Albumin: 4.6 g/dL (ref 3.6–5.1)
Alkaline phosphatase (APISO): 79 U/L (ref 33–115)
BUN: 10 mg/dL (ref 7–25)
CO2: 27 mmol/L (ref 20–32)
Calcium: 9.5 mg/dL (ref 8.6–10.2)
Chloride: 105 mmol/L (ref 98–110)
Creat: 0.83 mg/dL (ref 0.50–1.10)
Globulin: 2.6 g/dL (calc) (ref 1.9–3.7)
Glucose, Bld: 87 mg/dL (ref 65–99)
Potassium: 4.1 mmol/L (ref 3.5–5.3)
Sodium: 141 mmol/L (ref 135–146)
Total Bilirubin: 0.5 mg/dL (ref 0.2–1.2)
Total Protein: 7.2 g/dL (ref 6.1–8.1)

## 2017-07-24 LAB — LIPASE: Lipase: 19 U/L (ref 7–60)

## 2017-07-24 LAB — POCT URINALYSIS DIPSTICK
Bilirubin, UA: NEGATIVE
Glucose, UA: NEGATIVE
Ketones, UA: NEGATIVE
Nitrite, UA: NEGATIVE
Protein, UA: NEGATIVE
Spec Grav, UA: 1.01 (ref 1.010–1.025)
Urobilinogen, UA: 0.2 E.U./dL
pH, UA: 7 (ref 5.0–8.0)

## 2017-07-24 LAB — TSH: TSH: 0.83 mIU/L

## 2017-07-24 LAB — POCT URINE PREGNANCY: Preg Test, Ur: NEGATIVE

## 2017-07-24 MED ORDER — PROMETHAZINE HCL 25 MG/ML IJ SOLN
25.0000 mg | Freq: Once | INTRAMUSCULAR | Status: AC
  Administered 2017-07-24: 25 mg via INTRAVENOUS

## 2017-07-24 MED ORDER — PROMETHAZINE HCL 25 MG PO TABS: 12.5000 mg | ORAL_TABLET | Freq: Four times a day (QID) | ORAL | 0 refills | Status: DC | PRN

## 2017-07-24 MED ORDER — POLYETHYLENE GLYCOL 3350 17 G PO PACK: 17.0000 g | PACK | Freq: Every day | ORAL | 0 refills | Status: DC

## 2017-07-24 MED ORDER — KETOROLAC TROMETHAMINE 30 MG/ML IJ SOLN
30.0000 mg | Freq: Once | INTRAMUSCULAR | Status: AC
  Administered 2017-07-24: 30 mg via INTRAMUSCULAR

## 2017-07-24 NOTE — Progress Notes (Signed)
HPI:                                                                Alicia Sanders is a 37 y.o. female who [facility] today for abdominal pain  Emesis   This is a new problem. The current episode started in the past 7 days. The problem occurs 5 to 10 times per day. The problem has been gradually worsening. The emesis has an appearance of stomach contents. Associated symptoms include abdominal pain, chills, diarrhea (loose stools) and headaches. Pertinent negatives include no fever. She has tried increased fluids and bed rest (Zofran) for the symptoms. The treatment provided mild relief.  Abdominal Pain  This is a new problem. The current episode started 1 to 4 weeks ago. The onset quality is gradual. The problem occurs constantly. The problem has been waxing and waning. The pain is located in the RLQ and RUQ. The pain is moderate. The quality of the pain is sharp. The abdominal pain radiates to the right flank. Associated symptoms include anorexia, diarrhea (loose stools), headaches, nausea and vomiting. Pertinent negatives include no dysuria, fever, frequency, hematochezia or melena. The pain is aggravated by certain positions. The pain is relieved by nothing. She has tried proton pump inhibitors for the symptoms. The treatment provided no relief. Prior diagnostic workup includes CT scan (neg CT abd/pel Jan 2019). Her past medical history is significant for abdominal surgery.        No flowsheet data found.    Past Medical History:  Diagnosis Date  . Back problem   . Thyroid disease    Past Surgical History:  Procedure Laterality Date  . HAND SURGERY Right    Social History   Tobacco Use  . Smoking status: Never Smoker  . Smokeless tobacco: Never Used  Substance Use Topics  . Alcohol use: Not on file   family history is not on file.    ROS: negative except as noted in the HPI  Medications: Current  Outpatient Medications  Medication Sig Dispense Refill  . albuterol (PROVENTIL HFA;VENTOLIN HFA) 108 (90 Base) MCG/ACT inhaler 2 puffs 15 minutes before physical activity and exercise 2 Inhaler 11  . buPROPion (WELLBUTRIN XL) 300 MG 24 hr tablet Take 1 tablet (300 mg total) by mouth daily. Due for follow up visit 30 tablet 0  . cetirizine (ZYRTEC) 10 MG tablet Take 1 tablet (10 mg total) by mouth daily. 30 tablet 11  . clobetasol cream (TEMOVATE) 0.05 % Apply 1 application topically 2 (two) times daily. 60 g 2  . clonazePAM (KLONOPIN) 0.5 MG tablet TAKE 1 TABLET BY MOUTH THREE TIMES DAILY AS NEEDED 20 tablet 0  . cromolyn (NASALCROM) 5.2 MG/ACT nasal spray Place 2 sprays into both nostrils 3 (three) times daily. In each nostrl 1 mL 11  . cyclobenzaprine (FLEXERIL) 10 MG tablet One half tab PO qHS, then increase gradually to one tab TID. 30 tablet 0  . diazepam (VALIUM) 5 MG tablet Take 1 tab PO 1 hour before procedure or imaging. 2 tablet 0  . fluticasone (FLONASE) 50 MCG/ACT nasal spray Two spray in each nostril twice a day, use left hand for right nostril, and right hand for left nostril. 48 g  3  . levothyroxine (SYNTHROID, LEVOTHROID) 50 MCG tablet Take 1 tablet (50 mcg total) by mouth daily. 90 tablet 3  . meloxicam (MOBIC) 15 MG tablet One tab PO qAM with breakfast for 2 weeks, then daily prn pain. 30 tablet 3  . montelukast (SINGULAIR) 10 MG tablet Take 1 tablet (10 mg total) by mouth at bedtime. 90 tablet 3  . ondansetron (ZOFRAN-ODT) 8 MG disintegrating tablet Take 1 tablet (8 mg total) by mouth every 8 (eight) hours as needed for nausea. 20 tablet 3  . phentermine (ADIPEX-P) 37.5 MG tablet Take 0.5 tablets (18.75 mg total) by mouth daily before breakfast. 45 tablet 0  . topiramate (TOPAMAX) 50 MG tablet TAKE ONE TAB BY MOUTH AT BEDTIME 30 tablet 2  . traMADol (ULTRAM) 50 MG tablet TAKE 1 TABLET BY MOUTH 3 TIMES DAILY AS NEEDED 270 tablet 0  . polyethylene glycol (MIRALAX) packet Take 17 g  by mouth daily. 14 each 0  . promethazine (PHENERGAN) 25 MG tablet Take 0.5-1 tablets (12.5-25 mg total) by mouth every 6 (six) hours as needed for nausea or vomiting. 30 tablet 0   No current facility-administered medications for this visit.    Allergies  Allergen Reactions  . Diflucan [Fluconazole] Diarrhea    severe  . Thimerosal Swelling and Other (See Comments)    Blisters and swelling at injection site; significant malaise.  CAN tolerate preservative-free flu vaccine (10/24/16)       Objective:  BP 118/80   Pulse 86   Temp 98.2 F (36.8 C) (Oral)   Resp 14   Wt 166 lb (75.3 kg)   LMP 07/24/2017 (Exact Date)   SpO2 100%   BMI 27.62 kg/m  Gen:  alert,  ill-appearing, not toxic-appearing, laying in fetal position on exam table, no acute distress HEENT: head normocephalic without obvious abnormality, conjunctiva and cornea clear, oropharynx clear, moist mucous membranes, trachea midline Pulm: Normal work of breathing, normal phonation, clear to auscultation bilaterally, no wheezes, rales or rhonchi CV: Normal rate, regular rhythm, s1 and s2 distinct, no murmurs, clicks or rubs  GI: abdomen soft, there is right-sided tenderness without guarding, negative Murphy's sign, no CVA tenderness Neuro: alert and oriented x 3, no tremor MSK: extremities atraumatic, normal gait and station Skin: intact, no rashes on exposed skin, no jaundice, no cyanosis Psych: well-groomed, cooperative, good eye contact, euthymic mood, affect mood-congruent, speech is articulate, and thought processes clear and goal-directed    Inserted #20 gauge, 1-inch BD Insyte angiocatheter in the left antecubital on 1 attempt. Good blood return. Flushed with normal saline.      Results for orders placed or performed in visit on 07/24/17 (from the past 72 hour(s))  POCT urine pregnancy     Status: Normal   Collection Time: 07/24/17 10:59 AM  Result Value Ref Range   Preg Test, Ur Negative Negative  POCT  Urinalysis Dipstick     Status: Abnormal   Collection Time: 07/24/17 11:40 AM  Result Value Ref Range   Color, UA yellow    Clarity, UA cloudy    Glucose, UA Negative Negative   Bilirubin, UA negative    Ketones, UA negative    Spec Grav, UA 1.010 1.010 - 1.025   Blood, UA moderate    pH, UA 7.0 5.0 - 8.0   Protein, UA Negative Negative   Urobilinogen, UA 0.2 0.2 or 1.0 E.U./dL   Nitrite, UA negative    Leukocytes, UA Trace (A) Negative   Appearance  Odor     Ct Renal Stone Study  Result Date: 07/24/2017 CLINICAL DATA:  Right flank pain with nausea and vomiting EXAM: CT ABDOMEN AND PELVIS WITHOUT CONTRAST TECHNIQUE: Multidetector CT imaging of the abdomen and pelvis was performed following the standard protocol without oral or IV contrast. COMPARISON:  January 28, 2017 FINDINGS: Lower chest: There is slight atelectasis in the posterior right base. Lung bases otherwise are clear. Hepatobiliary: No focal liver lesions are appreciable on this noncontrast enhanced study. There is no appreciable gallbladder wall thickening. There is no biliary duct dilatation. Pancreas: No pancreatic mass or inflammatory focus. Spleen: No splenic lesions are evident. Adrenals/Urinary Tract: Adrenals bilaterally appear normal. Kidneys bilaterally show no evident mass or hydronephrosis on either side. There is a junctional parenchymal defect in each kidney, an anatomic variant. There is no appreciable renal or ureteral calculus on either side. Urinary bladder is midline with wall thickness within normal limits. Stomach/Bowel: There is moderate stool in the colon. There is no appreciable bowel wall or mesenteric thickening. There is no appreciable bowel obstruction or free air. Vascular/Lymphatic: There is no abdominal aortic aneurysm. No vascular lesions are evident. No adenopathy is appreciable in the abdomen or pelvis by size criteria. There are scattered subcentimeter lymph nodes in the right mid to lower abdomen.  Reproductive: Uterus is anteverted. Intrauterine device is positioned within the endometrium. There is no evident pelvic mass. Other: There is no periappendiceal region inflammation. No abscess or ascites is evident in the abdomen or pelvis. Musculoskeletal: There are no blastic or lytic bone lesions. There is no intramuscular or abdominal wall lesion evident. IMPRESSION: 1. No evident renal or ureteral calculus. No hydronephrosis. Urinary bladder thickness normal. 2. No evident bowel obstruction. No abscess in the abdomen pelvis. No periappendiceal region inflammation. 3. Scattered right abdominal subcentimeter mesenteric lymph nodes. These lymph nodes appear nonspecific. In the appropriate clinical setting, these lymph nodes could indicate a degree of mesenteric adenitis. Largest of these lymph nodes has a short axis diameter of 7 mm. 4.  Intrauterine device positioned within the endometrium. Electronically Signed   By: Bretta Bang III M.D.   On: 07/24/2017 12:29      Assessment and Plan: 37 y.o. female with   Mesenteric adenitis - Plan: Ambulatory referral to Gastroenterology, ketorolac (TORADOL) 30 MG/ML injection 30 mg  Generalized abdominal pain - Plan: POCT Urinalysis Dipstick, POCT urine pregnancy, CBC with Differential/Platelet, Comprehensive metabolic panel, Lipase, TSH, Urine Culture, CT RENAL STONE STUDY, ketorolac (TORADOL) 30 MG/ML injection 30 mg  Non-intractable vomiting with nausea, unspecified vomiting type - Plan: CBC with Differential/Platelet, Comprehensive metabolic panel, Lipase, TSH, promethazine (PHENERGAN) 25 MG tablet, promethazine (PHENERGAN) injection 25 mg  Gross hematuria - Plan: CT RENAL STONE STUDY  Constipation, unspecified constipation type - Plan: polyethylene glycol (MIRALAX) packet  - afebrile, no tachycardia, no tachypnea, benign abdominal exam. U preg negative - UA was positive for moderate blood, patient reported she thinks she may be spotting, but  she does not have menstrual periods due to IUD - CT stone study was ordered to assess for nephrolithiasis in the setting of colicky right-sided abdominal pain, nausea/vomiting, and hematuria - personally reviewed CT, which showed moderate stool burden, right-sided nonspecific mesenteric lymph nodes. Mesenteric adenitis could certainly explain patient's waxing and waning symptoms for the last 3 weeks. Referring to GI - patient was infused with 1L NS, given antiemetics via IV, and IM Toradol for headache. She was discharged with oral antiemetics and instructed to take her Toradol  prn for headache/moderate pain. Counseled on supportive care for nausea and headache - CBC w/diff, CMP, lipase pending. Urine culture pending. No evidence of UTI/pyelonephritis on CT   Patient education and anticipatory guidance given Patient agrees with treatment plan Follow-up as needed if symptoms worsen or fail to improve  Levonne Hubert PA-C

## 2017-07-25 ENCOUNTER — Encounter: Payer: Self-pay | Admitting: Gastroenterology

## 2017-07-25 LAB — URINE CULTURE
MICRO NUMBER:: 90876203
SPECIMEN QUALITY:: ADEQUATE

## 2017-07-26 ENCOUNTER — Encounter: Payer: Self-pay | Admitting: Physician Assistant

## 2017-08-02 ENCOUNTER — Encounter: Payer: Self-pay | Admitting: Physician Assistant

## 2017-08-09 ENCOUNTER — Other Ambulatory Visit: Payer: Self-pay | Admitting: Physician Assistant

## 2017-08-09 DIAGNOSIS — F39 Unspecified mood [affective] disorder: Secondary | ICD-10-CM

## 2017-08-16 ENCOUNTER — Other Ambulatory Visit: Payer: Self-pay | Admitting: Sports Medicine

## 2017-08-21 ENCOUNTER — Encounter: Payer: Self-pay | Admitting: Physician Assistant

## 2017-08-22 MED ORDER — ALPRAZOLAM 0.5 MG PO TABS: 0.5000 mg | ORAL_TABLET | Freq: Three times a day (TID) | ORAL | 0 refills | Status: DC | PRN

## 2017-08-29 ENCOUNTER — Other Ambulatory Visit: Payer: Self-pay | Admitting: Physician Assistant

## 2017-08-29 DIAGNOSIS — F39 Unspecified mood [affective] disorder: Secondary | ICD-10-CM

## 2017-09-20 ENCOUNTER — Ambulatory Visit: Payer: PRIVATE HEALTH INSURANCE | Admitting: Gastroenterology

## 2017-09-20 ENCOUNTER — Encounter: Payer: Self-pay | Admitting: Gastroenterology

## 2017-09-20 VITALS — BP 122/80 | HR 68 | Ht 64.75 in | Wt 174.2 lb

## 2017-09-20 DIAGNOSIS — R194 Change in bowel habit: Secondary | ICD-10-CM

## 2017-09-20 DIAGNOSIS — I880 Nonspecific mesenteric lymphadenitis: Secondary | ICD-10-CM | POA: Diagnosis not present

## 2017-09-20 DIAGNOSIS — R1031 Right lower quadrant pain: Secondary | ICD-10-CM | POA: Diagnosis not present

## 2017-09-20 DIAGNOSIS — R110 Nausea: Secondary | ICD-10-CM

## 2017-09-20 DIAGNOSIS — R935 Abnormal findings on diagnostic imaging of other abdominal regions, including retroperitoneum: Secondary | ICD-10-CM

## 2017-09-20 NOTE — Progress Notes (Signed)
GASTROENTEROLOGY OUTPATIENT CLINIC VISIT   Primary Care Provider Alicia Sanders [address] [place] [address] [phone]  Referring Provider Alicia Sanders [address] [place], [place] [address] [phone]  Patient Profile: Alicia Sanders is a 37 y.o. female with a pmh significant for Hypothyroidism, Asthma, Anxiety, Obesity, DJD, Possible Mesenteric Adenitis.  The patient presents to the [facility] for an evaluation and management of problem(s) noted below:  Problem List 1. RLQ abdominal pain   2. Abnormal CT of the abdomen   3. Mesenteric adenitis   4. Nausea   5. Change in bowel habits     History of Present Illness: This is the patient's first visit to the [facility].  She has had GI issues over the course of last 9 to 12 months as described below and also had CT findings suggestive of possible mesenteric adenitis some 2019.  The patient calls back in January having some sort of illness.  She is presumed to be a viral illness seated with significant fatigue as well as loss of appetite and required her to miss many weeks of work.  After the time things improved from the energy perspective.  However she describes that there was a transition and change in her bowel habits starting after that hospital stay and she began to experience more significant constipation and was voiding either daily or every other day when previously she was going to be times daily which was her norm for many years.  She has subsequently been initiated on Colace daily and one point time was taking MiraLAX she describes taking up to 3 times per day without that being helped.  She drinks at least 4 bottles of water daily may even do 5 or 6 at times.  She describes satiety as well as nausea will have to take Phenergan which is been prescribed to her by her primary up to 3 times per week but not on the same day.  She was  initiated on antibiotics back in January but has not been on any antibiotics for the last 6 months.  She describes having night sweats couple times per week is not clear to her whether she is "perimenopausal" as per discussion.  She has an IUD and is currently within appropriate time.  At that life expectancy for that IUD.  Summer when she presented to her primary care physician and subsequently had a cross-sectional CT scan without contrast to rule out nephrolithiasis they thought they saw lymph nodes in the region of the right lower quadrant.  Appendix was normal-appearing.  She was diagnosed with possible mesenteric adenitis asked to follow-up with her primary care physician.  Referral to GI was placed at that time.  Describes her pain is severe, sharp, stabbing quality lasting for many hours.  This is different than some of the symptoms that we have discussed above.  This pain has subsided somewhat but is still present at times.  Interestingly when she has a bowel movement she feels better potentially up to 50% of the time but she cannot recall that this occurs every time and thus the rest of the 50%.  The patient takes nonsteroidals on an infrequent basis the patient has never had an upper or lower endoscopy.  GI Review of Systems Positive as above infrequent pyrosis, bloating, abdominal distention Negative for dysphasia, odynophagia, jaundice, melena, hematochezia, weight loss  Review of Systems General: Denies fevers/chills HEENT:  Denies oral lesions Cardiovascular: Denies chest pain Pulmonary: Denies shortness of breath Gastroenterological: See HPI  Genitourinary: Denies darkened urine or hematuria Hematological: Denies easy bruising Endocrine: Denies temperature intolerance Dermatological: Denies skin changes or jaundice Psychological: Highly anxious about wanting to her GI symptoms Musculoskeletal: Denies new arthralgias   Medications Current Outpatient Medications  Medication Sig  Dispense Refill  . albuterol (PROVENTIL HFA;VENTOLIN HFA) 108 (90 Base) MCG/ACT inhaler 2 puffs 15 minutes before physical activity and exercise 2 Inhaler 11  . buPROPion (WELLBUTRIN XL) 300 MG 24 hr tablet Take 1 tablet (300 mg total) by mouth daily. PATIENT MUST MAKE A FOLLOW-UP APPOINTMENT FOR ANY FUTURE REFILLS. 15 tablet 0  . cetirizine (ZYRTEC) 10 MG tablet Take 1 tablet (10 mg total) by mouth daily. 30 tablet 11  . clonazePAM (KLONOPIN) 0.5 MG tablet TAKE 1 TABLET BY MOUTH THREE TIMES DAILY AS NEEDED 20 tablet 0  . cyclobenzaprine (FLEXERIL) 10 MG tablet One half tab PO qHS, then increase gradually to one tab TID. (Patient taking differently: as needed. ) 30 tablet 0  . fluticasone (FLONASE) 50 MCG/ACT nasal spray Two spray in each nostril twice a day, use left hand for right nostril, and right hand for left nostril. 48 g 3  . levothyroxine (SYNTHROID, LEVOTHROID) 50 MCG tablet Take 1 tablet (50 mcg total) by mouth daily. 90 tablet 3  . montelukast (SINGULAIR) 10 MG tablet Take 1 tablet (10 mg total) by mouth at bedtime. 90 tablet 3  . ondansetron (ZOFRAN-ODT) 8 MG disintegrating tablet Take 1 tablet (8 mg total) by mouth every 8 (eight) hours as needed for nausea. 20 tablet 3  . polyethylene glycol (MIRALAX) packet Take 17 g by mouth daily. 14 each 0  . promethazine (PHENERGAN) 25 MG tablet Take 0.5-1 tablets (12.5-25 mg total) by mouth every 6 (six) hours as needed for nausea or vomiting. 30 tablet 0  . traMADol (ULTRAM) 50 MG tablet TAKE 1 TABLET BY MOUTH THREE TIMES A DAY AS NEEDED 90 tablet 2  . ALPRAZolam (XANAX) 0.5 MG tablet Take 1 tablet (0.5 mg total) by mouth 3 (three) times daily as needed for anxiety. (Patient not taking: Reported on 09/20/2017) 15 tablet 0  . clobetasol cream (TEMOVATE) 0.05 % Apply 1 application topically 2 (two) times daily. (Patient not taking: Reported on 09/20/2017) 60 g 2  . meloxicam (MOBIC) 15 MG tablet One tab PO qAM with breakfast for 2 weeks, then daily  prn pain. (Patient not taking: Reported on 09/20/2017) 30 tablet 3   No current facility-administered medications for this visit.     Allergies Allergies  Allergen Reactions  . Diflucan [Fluconazole] Diarrhea    severe  . Thimerosal Swelling and Other (See Comments)    Blisters and swelling at injection site; significant malaise.  CAN tolerate preservative-free flu vaccine (10/24/16)    Histories Past Medical History:  Diagnosis Date  . Back problem   . Eczema   . Hypothyroidism    Past Surgical History:  Procedure Laterality Date  . HAND SURGERY Right   . INTRAUTERINE DEVICE INSERTION     Social History   Socioeconomic History  . Marital status: Married    Spouse name: Not on file  . Number of children: 2  . Years of education: Not on file  . Highest education level: Not on file  Occupational History  . Occupation: CT tech  Social Needs  . Financial resource strain: Not on file  . Food insecurity:    Worry: Not  on file    Inability: Not on file  . Transportation needs:    Medical: Not on file    Non-medical: Not on file  Tobacco Use  . Smoking status: Never Smoker  . Smokeless tobacco: Never Used  Substance and Sexual Activity  . Alcohol use: Yes    Comment: occasional  . Drug use: Never  . Sexual activity: Not on file  Lifestyle  . Physical activity:    Days per week: Not on file    Minutes per session: Not on file  . Stress: Not on file  Relationships  . Social connections:    Talks on phone: Not on file    Gets together: Not on file    Attends religious service: Not on file    Active member of club or organization: Not on file    Attends meetings of clubs or organizations: Not on file    Relationship status: Not on file  . Intimate partner violence:    Fear of current or ex partner: Not on file    Emotionally abused: Not on file    Physically abused: Not on file    Forced sexual activity: Not on file  Other Topics Concern  . Not on file    Social History Narrative  . Not on file   Family History  Problem Relation Age of Onset  . Heart disease Father   . Heart disease Brother   . Diabetes Maternal Grandmother   . Lung cancer Maternal Grandfather   . Heart disease Paternal Grandfather   . Colon cancer Neg Hx   . Esophageal cancer Neg Hx   . Inflammatory bowel disease Neg Hx   . Liver disease Neg Hx   . Pancreatic cancer Neg Hx   . Rectal cancer Neg Hx   . Stomach cancer Neg Hx    I have reviewed her medical, social, and family history in detail and updated the electronic medical record as necessary.    PHYSICAL EXAMINATION  BP 122/80 (BP Location: Left Arm, Patient Position: Sitting, Cuff Size: Normal)   Pulse 68   Ht 5' 4.75" (1.645 m) Comment: height measured without shoes  Wt 174 lb 4 oz (79 kg)   BMI 29.22 kg/m  Wt Readings from Last 3 Encounters:  09/20/17 174 lb 4 oz (79 kg)  07/24/17 166 lb (75.3 kg)  01/28/17 150 lb (68 kg)  GEN: NAD, appears stated age, doesn't appear chronically ill PSYCH: Cooperative, without pressured speech EYE: Conjunctivae pink, sclerae anicteric ENT: MMM, without oral ulcers, no erythema or exudates noted NECK: Supple CV: RR without R/Gs  RESP: CTAB posteriorly, without wheezing GI: NABS, soft, tenderness to palpation in the mid epigastrium and right lower quadrant upon deep palpation, volitional guarding appreciated, no rebound tenderness, nondistended, no HSM appreciated MSK/EXT: No lower extremity edema  SKIN: No concerning rashes or jaundice NEURO:  Alert & Oriented x 3, no focal deficits   REVIEW OF DATA  I reviewed the following data at the time of this encounter:  GI Procedures and Studies  None  Laboratory Studies  Reviewed in EPIC  Imaging Studies  7/19 CTAP without Contrast IMPRESSION: 1. No evident renal or ureteral calculus. No hydronephrosis. Urinary bladder thickness normal. 2. No evident bowel obstruction. No abscess in the abdomen pelvis. No  periappendiceal region inflammation. 3. Scattered right abdominal subcentimeter mesenteric lymph nodes. These lymph nodes appear nonspecific. In the appropriate clinical setting, these lymph nodes could indicate a degree of mesenteric adenitis.  Largest of these lymph nodes has a short axis diameter of 7 mm. 4.  Intrauterine device positioned within the endometrium.  1/19 CTAP IMPRESSION: No acute or significant abnormalities.   ASSESSMENT  Ms. Rister is a 37 y.o. female with a pmh significant for Hypothyroidism, Asthma, Anxiety, Obesity, DJD, Possible Mesenteric Adenitis.  The patient is seen today for evaluation and management of:  1. RLQ abdominal pain   2. Abnormal CT of the abdomen   3. Mesenteric adenitis   4. Nausea   5. Change in bowel habits    This is a hemodynamically stable patient who has had multiple GI symptoms over the course the past year with persistent recurrence of abdominal pain as well as a non-intractable nausea and change in bowel habits.  He has been gaining weight rather than losing weight which gives [place] some sense of calmness while we try and work through her symptoms.  In regards to the abnormal CT of the abdomen suggesting possible mesenteric adenitis, this was a noncontrasted CT so sometimes we can see things that may not always be present if IV contrast had been given/administered.  Is not impossible to suspect however that this could be a diagnosis.  With that being said the utility of further testing is necessary to rule out inflammatory bowel disease as well as other GI etiologies including.  At times we notified a cause for mesenteric adenitis and hopefully after 3 to 4 months (this can be seen as long or longer in children who have been diagnosed with this) symptoms seem to improve.  I have discussed trying to rule out etiologies for her pain with laboratory testing as well as stool antigen testing for H. pylori and to rule out celiac disease.  Like to send a  CRP to understand whether there is a degree of inflammation that is occurring in the body that would push [place] towards further diagnostic endoscopic evaluation.  I discussed with the patient that if the work-up is unremarkable that we will likely pursue an upper or lower endoscopy to further evaluate her symptoms and rule out IBD.  If this work-up remains negative a repeat contrast may be considered.  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  All patient questions were answered, to the best of my ability, and the patient agrees to the aforementioned plan of action with follow-up as indicated.   PLAN  1. Abnormal CT of the abdomen  2. Mesenteric adenitis - May consider EGD/Colonoscopy based on the evaluation below  3. RLQ abdominal pain - CBC; Future - Basic Metabolic Panel (BMET); Future - Hepatic function panel; Future - CRP High sensitivity; Future - Cortisol-am, blood; Future - Food/Symptom Diary for next few weeks  4. Nausea - Amylase; Future - Hepatic function panel; Future - IgA; Future - Tissue transglutaminase, IgA; Future - Helicobacter pylori special antigen; Future - Lipase; Future - Cortisol-am, blood; Future  5. Change in bowel habits - Fiber supplementation - Continue to drink water significantly - Will consider initiation of pro-motility agents for constipation based on her symptomatology   Orders Placed This Encounter  Procedures  . Helicobacter pylori special antigen  . CBC  . Basic Metabolic Panel (BMET)  . Amylase  . Hepatic function panel  . CRP High sensitivity  . IgA  . Tissue transglutaminase,  IgA  . Lipase  . Cortisol-am, blood    New Prescriptions   No medications on file   Modified Medications   No medications on file    Planned Follow Up: No follow-ups on file.   Corliss Parish, MD [facility] Office # [phone]

## 2017-09-20 NOTE — Patient Instructions (Addendum)
If you are age 37 or older, your body mass index should be between 23-30. Your Body mass index is 29.22 kg/m. If this is out of the aforementioned range listed, please consider follow up with your Primary Care Provider.  If you are age 37 or younger, your body mass index should be between 19-25. Your Body mass index is 29.22 kg/m. If this is out of the aformentioned range listed, please consider follow up with your Primary Care Provider.   Your provider has requested that you go to the basement level for lab work before leaving today. Press "B" on the elevator. The lab is located at the first door on the left as you exit the elevator.  H Pylori lab - MUST BE OFF OMEPRAZOLE 2 WEEKS PRIOR. Have lab done 09/30/17  You have been given samples of Linzess  72 mcg daily.  If this medication works for you, call us so that we can send in a prescription to your pharmacy.  Thank you for choosing me and [facility].   Corliss ParishGabriel Mansouraty, MD

## 2017-09-23 ENCOUNTER — Encounter: Payer: Self-pay | Admitting: Gastroenterology

## 2017-09-23 ENCOUNTER — Other Ambulatory Visit (INDEPENDENT_AMBULATORY_CARE_PROVIDER_SITE_OTHER): Payer: PRIVATE HEALTH INSURANCE

## 2017-09-23 DIAGNOSIS — R110 Nausea: Secondary | ICD-10-CM

## 2017-09-23 DIAGNOSIS — R194 Change in bowel habit: Secondary | ICD-10-CM

## 2017-09-23 DIAGNOSIS — R1031 Right lower quadrant pain: Secondary | ICD-10-CM | POA: Diagnosis not present

## 2017-09-23 DIAGNOSIS — R935 Abnormal findings on diagnostic imaging of other abdominal regions, including retroperitoneum: Secondary | ICD-10-CM | POA: Insufficient documentation

## 2017-09-23 HISTORY — DX: Change in bowel habit: R19.4

## 2017-09-23 LAB — CBC
HCT: 41.8 % (ref 36.0–46.0)
Hemoglobin: 14.4 g/dL (ref 12.0–15.0)
MCHC: 34.4 g/dL (ref 30.0–36.0)
MCV: 86 fl (ref 78.0–100.0)
Platelets: 253 10*3/uL (ref 150.0–400.0)
RBC: 4.86 Mil/uL (ref 3.87–5.11)
RDW: 13.4 % (ref 11.5–15.5)
WBC: 9.3 10*3/uL (ref 4.0–10.5)

## 2017-09-23 LAB — HEPATIC FUNCTION PANEL
ALT: 19 U/L (ref 0–35)
AST: 21 U/L (ref 0–37)
Albumin: 4.4 g/dL (ref 3.5–5.2)
Alkaline Phosphatase: 73 U/L (ref 39–117)
Bilirubin, Direct: 0.1 mg/dL (ref 0.0–0.3)
Total Bilirubin: 0.4 mg/dL (ref 0.2–1.2)
Total Protein: 7.6 g/dL (ref 6.0–8.3)

## 2017-09-23 LAB — BASIC METABOLIC PANEL
BUN: 10 mg/dL (ref 6–23)
CO2: 24 mEq/L (ref 19–32)
Calcium: 8.9 mg/dL (ref 8.4–10.5)
Chloride: 106 mEq/L (ref 96–112)
Creatinine, Ser: 0.79 mg/dL (ref 0.40–1.20)
GFR: 87.16 mL/min (ref >60.00)
Glucose, Bld: 94 mg/dL (ref 70–99)
Potassium: 4 mEq/L (ref 3.5–5.1)
Sodium: 138 mEq/L (ref 135–145)

## 2017-09-23 LAB — AMYLASE: Amylase: 42 U/L (ref 27–131)

## 2017-09-23 LAB — IGA: IgA: 101 mg/dL (ref 68–378)

## 2017-09-23 LAB — HIGH SENSITIVITY CRP: CRP, High Sensitivity: 4.1 mg/L (ref 0.000–5.000)

## 2017-09-23 LAB — LIPASE: Lipase: 27 U/L (ref 11.0–59.0)

## 2017-09-25 LAB — TISSUE TRANSGLUTAMINASE, IGA: (tTG) Ab, IgA: 1 U/mL

## 2017-09-25 LAB — CORTISOL-AM, BLOOD: Cortisol - AM: 11.4 ug/dL

## 2017-09-30 ENCOUNTER — Other Ambulatory Visit: Payer: Self-pay | Admitting: Physician Assistant

## 2017-09-30 DIAGNOSIS — F39 Unspecified mood [affective] disorder: Secondary | ICD-10-CM

## 2017-11-08 ENCOUNTER — Other Ambulatory Visit: Payer: Self-pay | Admitting: Sports Medicine

## 2017-11-08 ENCOUNTER — Encounter: Payer: Self-pay | Admitting: Sports Medicine

## 2017-11-11 ENCOUNTER — Encounter: Payer: Self-pay | Admitting: Physician Assistant

## 2017-11-11 MED ORDER — BUTALBITAL-APAP-CAFFEINE 50-325-40 MG PO TABS: 1.0000 | ORAL_TABLET | Freq: Four times a day (QID) | ORAL | 0 refills | Status: AC | PRN

## 2017-12-09 ENCOUNTER — Encounter: Payer: Self-pay | Admitting: Sports Medicine

## 2017-12-09 MED ORDER — PREDNISONE 50 MG PO TABS: 50.0000 mg | ORAL_TABLET | Freq: Every day | ORAL | 0 refills | Status: DC

## 2018-01-13 ENCOUNTER — Encounter: Payer: Self-pay | Admitting: Physician Assistant

## 2018-01-13 MED ORDER — FLUCONAZOLE 150 MG PO TABS: 150.0000 mg | ORAL_TABLET | Freq: Once | ORAL | 0 refills | Status: AC

## 2018-01-20 ENCOUNTER — Encounter: Payer: Self-pay | Admitting: Sports Medicine

## 2018-01-21 MED ORDER — PREDNISONE 20 MG PO TABS: 40.0000 mg | ORAL_TABLET | Freq: Every day | ORAL | 0 refills | Status: DC

## 2018-01-28 ENCOUNTER — Other Ambulatory Visit: Payer: Self-pay | Admitting: Sports Medicine

## 2018-01-28 ENCOUNTER — Encounter: Payer: Self-pay | Admitting: Physician Assistant

## 2018-01-28 MED ORDER — TRAMADOL HCL 50 MG PO TABS: 50.0000 mg | ORAL_TABLET | Freq: Three times a day (TID) | ORAL | 0 refills | Status: DC | PRN

## 2018-02-23 ENCOUNTER — Other Ambulatory Visit: Payer: Self-pay | Admitting: Physician Assistant

## 2018-02-24 ENCOUNTER — Encounter: Payer: Self-pay | Admitting: Physician Assistant

## 2018-02-25 ENCOUNTER — Encounter: Payer: Self-pay | Admitting: Physician Assistant

## 2018-02-25 ENCOUNTER — Ambulatory Visit (INDEPENDENT_AMBULATORY_CARE_PROVIDER_SITE_OTHER): Payer: PRIVATE HEALTH INSURANCE | Admitting: Physician Assistant

## 2018-02-25 VITALS — BP 138/86 | HR 86 | Temp 98.7°F | Resp 16 | Ht 65.0 in | Wt 178.0 lb

## 2018-02-25 DIAGNOSIS — R5383 Other fatigue: Secondary | ICD-10-CM

## 2018-02-25 DIAGNOSIS — R140 Abdominal distension (gaseous): Secondary | ICD-10-CM | POA: Diagnosis not present

## 2018-02-25 DIAGNOSIS — R635 Abnormal weight gain: Secondary | ICD-10-CM

## 2018-02-25 DIAGNOSIS — L680 Hirsutism: Secondary | ICD-10-CM | POA: Diagnosis not present

## 2018-02-25 DIAGNOSIS — R1032 Left lower quadrant pain: Secondary | ICD-10-CM

## 2018-02-25 DIAGNOSIS — L309 Dermatitis, unspecified: Secondary | ICD-10-CM

## 2018-02-25 DIAGNOSIS — R1031 Right lower quadrant pain: Secondary | ICD-10-CM

## 2018-02-25 MED ORDER — CRISABOROLE 2 % EX OINT: 1.0000 "application " | TOPICAL_OINTMENT | Freq: Two times a day (BID) | CUTANEOUS | 1 refills | Status: DC

## 2018-02-25 NOTE — Progress Notes (Signed)
Subjective:    Patient ID: Alicia Sanders, female    DOB: Jun 19, 1980, 38 y.o.   MRN: [id number]  HPI  Pt is a 38 yo female with hx of hypothyroidism who presents to the clinic with multiple concerning symptoms.   Her most concerning symptom is weight gain. She has gain 28lbs in one year. She reports 7lb weight gain in last 2 weeks. Her breast size went from C to DDD. She feels fatigued. She has more acne and hair growth around chin. She sweats more than she used too. She has IUD for birth control and been in place for a while. She does have issues with constipation. She has some "discomfort in the lower abdominal quadrants for a few months. She denies any dysuria. She just wants to see if there is something wrong.    .. Active Ambulatory Problems    Diagnosis Date Noted  . Non-seasonal allergic rhinitis 11/26/2007  . BREAST CYST 01/19/2009  . Hypothyroidism 07/11/2010  . Preventive measure 05/08/2012  . DDD (degenerative disc disease), lumbar 05/08/2012  . Weight gain 04/18/2015  . 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 Dysphagia 01/27/2016  . Mood disorder (HCC) 01/27/2016  . DDD (degenerative disc disease), cervical 01/27/2016  . Pleuritic chest pain 01/27/2016  . Overweight (BMI 25.0-29.9) 06/25/2016  . Other viral warts 06/26/2016  . Acute cystitis 10/11/2016  . Cough 01/23/2017  . RLQ abdominal pain 01/28/2017  . 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 Abnormal CT of the abdomen 09/23/2017  . Nausea 09/23/2017  . Change in bowel habits 09/23/2017  . Hirsutism 03/05/2018  . Bloating 03/05/2018  . Bilateral lower abdominal discomfort 03/05/2018  . No energy 03/05/2018   Resolved Ambulatory Problems    Diagnosis Date Noted  . Dermatophytosis of nail 09/04/2007  . ANXIETY STATE NOS 10/22/2006  . OTITIS MEDIA, SEROUS, ACUTE, RIGHT 12/03/2009  . HOT FLASHES 10/16/2006  . DERMATITIS 09/07/2009  . SKIN RASH 09/04/2007  . SYMPTOM, ENLARGEMENT, LYMPH NODES 10/16/2006  . OTITIS  EXTERNA 07/11/2010  . LOW BACK PAIN, ACUTE 08/03/2010  . BACK STRAIN, THORACIC 08/03/2010  . Acute maxillary sinusitis 05/08/2012  . Dysfunction of right eustachian tube 06/03/2012  . Trochanteric bursitis of left hip 08/28/2012  . Plantar fasciitis, right 04/18/2015  . Acute maxillary sinusitis 10/26/2015   Past Medical History:  Diagnosis Date  . Back problem   . Eczema         Review of Systems See HPI.     Objective:   Physical Exam Vitals signs reviewed.  HENT:     Head: Normocephalic and atraumatic.     Right Ear: Tympanic membrane normal.     Left Ear: Tympanic membrane normal.     Nose: Nose normal.     Mouth/Throat:     Pharynx: Oropharynx is clear.  Eyes:     Conjunctiva/sclera: Conjunctivae normal.  Cardiovascular:     Rate and Rhythm: Normal rate and regular rhythm.     Pulses: Normal pulses.     Heart sounds: Normal heart sounds.  Pulmonary:     Effort: Pulmonary effort is normal.     Breath sounds: Normal breath sounds.  Abdominal:     General: Bowel sounds are normal. There is no distension.     Palpations: Abdomen is soft.     Tenderness: There is no abdominal tenderness. There is no guarding or rebound.  Skin:    Comments: Coarse hair under chin with erythematous papules.  Neurological:     General:  No focal deficit present.     Mental Status: She is alert and oriented to person, place, and time.  Psychiatric:        Mood and Affect: Mood normal.           Assessment & Plan:  Marland Kitchen.Marland Kitchen.Lurena JoinerRebecca was seen today for weight/height check and blood work request.  Diagnoses and all orders for this visit:  Weight gain -     TSH -     Thyroid peroxidase antibody -     FSH/LH -     Testosterone -     Estradiol -     COMPLETE METABOLIC PANEL WITH GFR -     Z61B12 and Folate Panel -     Progesterone -     VITAMIN D 25 Hydroxy (Vit-D Deficiency, Fractures) -     CBC with Differential/Platelet -     US THYROID -     US Pelvic Complete With  Transvaginal  No energy -     TSH -     Thyroid peroxidase antibody -     FSH/LH -     Testosterone -     Estradiol -     COMPLETE METABOLIC PANEL WITH GFR -     W96B12 and Folate Panel -     Progesterone -     VITAMIN D 25 Hydroxy (Vit-D Deficiency, Fractures) -     CBC with Differential/Platelet -     US THYROID -     US Pelvic Complete With Transvaginal  Hirsutism -     TSH -     Thyroid peroxidase antibody -     FSH/LH -     Testosterone -     Estradiol -     COMPLETE METABOLIC PANEL WITH GFR -     E45B12 and Folate Panel -     Progesterone -     VITAMIN D 25 Hydroxy (Vit-D Deficiency, Fractures) -     CBC with Differential/Platelet -     US THYROID -     US Pelvic Complete With Transvaginal -     Testosterone, Total, LC/MS/MS  Bloating -     TSH -     Thyroid peroxidase antibody -     FSH/LH -     Testosterone -     Estradiol -     COMPLETE METABOLIC PANEL WITH GFR -     W09B12 and Folate Panel -     Progesterone -     VITAMIN D 25 Hydroxy (Vit-D Deficiency, Fractures) -     CBC with Differential/Platelet -     US THYROID -     US Pelvic Complete With Transvaginal  Bilateral lower abdominal discomfort -     TSH -     Thyroid peroxidase antibody -     FSH/LH -     Testosterone -     Estradiol -     COMPLETE METABOLIC PANEL WITH GFR -     W11B12 and Folate Panel -     Progesterone -     VITAMIN D 25 Hydroxy (Vit-D Deficiency, Fractures) -     CBC with Differential/Platelet -     US THYROID -     US Pelvic Complete With Transvaginal  Eczema, unspecified type -     Crisaborole (EUCRISA) 2 % OINT; Apply 1 application topically 2 (two) times daily.   Unclear etiology of symptoms. I do want to get a full lab panel. With hair  growth and weight gain could be something going on with testosterone/hormones. With bloating could be due to constipation but would like to look at uterus and ovaries as well for any abnormal growths. Hx of nodules will order repeat thyroid u/s.    Try eucrisa for eczema on face. Coupon card given.   If nothing abnormal with labs consider weight loss medication.   Marland Kitchen.Spent 30 minutes with patient and greater than 50 percent of visit spent counseling patient regarding treatment plan.

## 2018-02-26 ENCOUNTER — Other Ambulatory Visit: Payer: Self-pay | Admitting: Physician Assistant

## 2018-02-26 DIAGNOSIS — R7989 Other specified abnormal findings of blood chemistry: Secondary | ICD-10-CM

## 2018-02-26 MED ORDER — VITAMIN D (ERGOCALCIFEROL) 1.25 MG (50000 UNIT) PO CAPS: 50000.0000 [IU] | ORAL_CAPSULE | ORAL | 0 refills | Status: DC

## 2018-02-26 NOTE — Progress Notes (Signed)
Call pt: all you labs look great except vitamin D. I will send over high dose vitamin D for next three months then recheck.   Ok to send D3 50,000 units q weekly #12 NRF  Testosterone is still pending but ALL other hormones look normal.   Still waiting for u/s results as well.

## 2018-02-26 NOTE — Progress Notes (Signed)
Per results note: ". I will send over high dose vitamin D for next three months then recheck.   Ok to send D3 50,000 units q weekly #12 NRF "

## 2018-02-27 ENCOUNTER — Encounter: Payer: Self-pay | Admitting: Physician Assistant

## 2018-02-28 LAB — COMPLETE METABOLIC PANEL WITH GFR
AG Ratio: 1.7 (calc) (ref 1.0–2.5)
ALT: 18 U/L (ref 6–29)
AST: 14 U/L (ref 10–30)
Albumin: 4.3 g/dL (ref 3.6–5.1)
Alkaline phosphatase (APISO): 73 U/L (ref 31–125)
BUN: 11 mg/dL (ref 7–25)
CO2: 27 mmol/L (ref 20–32)
Calcium: 9 mg/dL (ref 8.6–10.2)
Chloride: 104 mmol/L (ref 98–110)
Creat: 0.7 mg/dL (ref 0.50–1.10)
GFR, Est African American: 128 mL/min/{1.73_m2} (ref >=60)
GFR, Est Non African American: 111 mL/min/{1.73_m2} (ref >=60)
Globulin: 2.6 g/dL (calc) (ref 1.9–3.7)
Glucose, Bld: 76 mg/dL (ref 65–99)
Potassium: 4.1 mmol/L (ref 3.5–5.3)
Sodium: 138 mmol/L (ref 135–146)
Total Bilirubin: 0.4 mg/dL (ref 0.2–1.2)
Total Protein: 6.9 g/dL (ref 6.1–8.1)

## 2018-02-28 LAB — B12 AND FOLATE PANEL
Folate: 6.2 ng/mL
Vitamin B-12: 463 pg/mL (ref 200–1100)

## 2018-02-28 LAB — CBC WITH DIFFERENTIAL/PLATELET
Absolute Monocytes: 361 cells/uL (ref 200–950)
Basophils Absolute: 43 cells/uL (ref 0–200)
Basophils Relative: 0.5 %
Eosinophils Absolute: 60 cells/uL (ref 15–500)
Eosinophils Relative: 0.7 %
HCT: 41.3 % (ref 35.0–45.0)
Hemoglobin: 14.3 g/dL (ref 11.7–15.5)
Lymphs Abs: 2064 cells/uL (ref 850–3900)
MCH: 29.6 pg (ref 27.0–33.0)
MCHC: 34.6 g/dL (ref 32.0–36.0)
MCV: 85.5 fL (ref 80.0–100.0)
MPV: 10.6 fL (ref 7.5–12.5)
Monocytes Relative: 4.2 %
Neutro Abs: 6072 cells/uL (ref 1500–7800)
Neutrophils Relative %: 70.6 %
Platelets: 270 10*3/uL (ref 140–400)
RBC: 4.83 10*6/uL (ref 3.80–5.10)
RDW: 12.7 % (ref 11.0–15.0)
Total Lymphocyte: 24 %
WBC: 8.6 10*3/uL (ref 3.8–10.8)

## 2018-02-28 LAB — THYROID PEROXIDASE ANTIBODY: Thyroperoxidase Ab SerPl-aCnc: <1 IU/mL (ref <9)

## 2018-02-28 LAB — PROGESTERONE: Progesterone: 0.5 ng/mL

## 2018-02-28 LAB — FSH/LH
FSH: 12.3 m[IU]/mL
LH: 27.8 m[IU]/mL

## 2018-02-28 LAB — TSH: TSH: 1.13 mIU/L

## 2018-02-28 LAB — VITAMIN D 25 HYDROXY (VIT D DEFICIENCY, FRACTURES): Vit D, 25-Hydroxy: 19 ng/mL — ABNORMAL LOW (ref 30–100)

## 2018-02-28 LAB — TESTOSTERONE, TOTAL, LC/MS/MS: Testosterone, Total, LC-MS-MS: 37 ng/dL (ref 2–45)

## 2018-02-28 LAB — ESTRADIOL: Estradiol: 166 pg/mL

## 2018-03-03 NOTE — Progress Notes (Signed)
Call pt: testosterone is in normal range. Labs look really good other than vitamin D.

## 2018-03-05 ENCOUNTER — Encounter: Payer: Self-pay | Admitting: Physician Assistant

## 2018-03-05 DIAGNOSIS — L680 Hirsutism: Secondary | ICD-10-CM | POA: Insufficient documentation

## 2018-03-05 DIAGNOSIS — R5383 Other fatigue: Secondary | ICD-10-CM

## 2018-03-05 DIAGNOSIS — R1031 Right lower quadrant pain: Secondary | ICD-10-CM

## 2018-03-05 DIAGNOSIS — R140 Abdominal distension (gaseous): Secondary | ICD-10-CM | POA: Insufficient documentation

## 2018-03-05 DIAGNOSIS — R1032 Left lower quadrant pain: Secondary | ICD-10-CM

## 2018-03-05 HISTORY — DX: Other fatigue: R53.83

## 2018-03-05 HISTORY — DX: Abdominal distension (gaseous): R14.0

## 2018-03-05 HISTORY — DX: Left lower quadrant pain: R10.32

## 2018-03-05 HISTORY — DX: Right lower quadrant pain: R10.31

## 2018-03-07 ENCOUNTER — Ambulatory Visit
Admission: RE | Admit: 2018-03-07 | Discharge: 2018-03-07 | Disposition: A | Discharge status: Home / Self Care | Payer: PRIVATE HEALTH INSURANCE | Source: Ambulatory Visit | Attending: Physician Assistant | Admitting: Physician Assistant

## 2018-03-07 MED ORDER — LINACLOTIDE 145 MCG PO CAPS: 145.0000 ug | ORAL_CAPSULE | Freq: Every day | ORAL | 0 refills | Status: DC

## 2018-03-07 NOTE — Addendum Note (Signed)
Addended by: [name] on: 03/07/2018 09:39 AM   Modules accepted: Orders

## 2018-03-10 MED ORDER — LIRAGLUTIDE -WEIGHT MANAGEMENT 18 MG/3ML ~~LOC~~ SOPN: 0.6000 mg | PEN_INJECTOR | Freq: Every day | SUBCUTANEOUS | 1 refills | Status: DC

## 2018-03-10 NOTE — Progress Notes (Signed)
Same thyroid nodules. No new nodules. You do have the one nodule same size as 2018 but continues to meet criteria for biopsy if you would like for me to order. Less concerning because not changed in size but does meet criteria.

## 2018-03-10 NOTE — Progress Notes (Signed)
Call pt: 2 small uterine fibroids. IUD in place. No other concerning features.

## 2018-03-10 NOTE — Addendum Note (Signed)
Addended by: [name] on: 03/10/2018 03:27 PM   Modules accepted: Orders

## 2018-03-11 ENCOUNTER — Other Ambulatory Visit: Payer: Self-pay | Admitting: Physician Assistant

## 2018-03-11 DIAGNOSIS — F39 Unspecified mood [affective] disorder: Secondary | ICD-10-CM

## 2018-03-11 MED ORDER — PHENTERMINE HCL 15 MG PO CAPS: 15.0000 mg | ORAL_CAPSULE | ORAL | 0 refills | Status: DC

## 2018-03-11 MED ORDER — BUPROPION HCL ER (XL) 300 MG PO TB24: ORAL_TABLET | ORAL | 3 refills | Status: DC

## 2018-03-11 NOTE — Telephone Encounter (Signed)
Request Reference Number: [id number]. SAXENDA INJ 18MG /3ML is denied due to Plan Exclusion. For further questions, call [phone]. Appeals are not supported through ePA. Please refer to the fax case notice for appeals information and instructions.   Since its a plan exclusion it does not give me an option to a PA. Do you have any other recommendations? Please advise.

## 2018-03-11 NOTE — Telephone Encounter (Signed)
I am going to see if she will do phentermine/topamax combination.

## 2018-03-11 NOTE — Addendum Note (Signed)
Addended by: [name] on: 03/11/2018 04:41 PM   Modules accepted: Orders

## 2018-03-18 ENCOUNTER — Other Ambulatory Visit: Payer: Self-pay | Admitting: Physician Assistant

## 2018-03-19 ENCOUNTER — Encounter: Payer: Self-pay | Admitting: Physician Assistant

## 2018-03-19 MED ORDER — CLONAZEPAM 0.5 MG PO TABS: ORAL_TABLET | ORAL | 0 refills | Status: DC

## 2018-03-28 ENCOUNTER — Encounter: Payer: Self-pay | Admitting: Physician Assistant

## 2018-04-10 ENCOUNTER — Telehealth: Payer: Self-pay | Admitting: Physician Assistant

## 2018-04-10 NOTE — Telephone Encounter (Signed)
Called pt and advised her that refill cannot be done until after appt. Pt has not been seen since 02/25/18, she missed her monthly check up for March for refill. Advised pt she could be worked in with someone today if she feels she needs to refill sooner, pt declined and states that she will wait until Monday.

## 2018-04-10 NOTE — Telephone Encounter (Signed)
Lets discuss on Monday. Higher dose not able to be continued for long periods of time plus multiple weight loss medications on list and lets figure that out on Monday.

## 2018-04-10 NOTE — Telephone Encounter (Signed)
Patient is wanting to know if she can get the next dosage up of phentermine 15 MG capsule [[id number]]. Which she thinks is 37.5. Patient has made a virtual visit and wants to know if this can be filled before this appointment on Monday. If this can be filled please send to [id number] [address], [place] - [address]. Please advise.

## 2018-04-14 ENCOUNTER — Encounter: Payer: Self-pay | Admitting: Physician Assistant

## 2018-04-14 ENCOUNTER — Telehealth: Payer: PRIVATE HEALTH INSURANCE | Admitting: Physician Assistant

## 2018-04-14 ENCOUNTER — Telehealth (INDEPENDENT_AMBULATORY_CARE_PROVIDER_SITE_OTHER): Payer: PRIVATE HEALTH INSURANCE | Admitting: Physician Assistant

## 2018-04-14 VITALS — HR 92 | Ht 65.0 in | Wt 171.0 lb

## 2018-04-14 DIAGNOSIS — J309 Allergic rhinitis, unspecified: Secondary | ICD-10-CM

## 2018-04-14 DIAGNOSIS — E663 Overweight: Secondary | ICD-10-CM

## 2018-04-14 MED ORDER — PHENTERMINE HCL 37.5 MG PO TABS: 37.5000 mg | ORAL_TABLET | Freq: Every day | ORAL | 0 refills | Status: DC

## 2018-04-14 MED ORDER — NAPHAZOLINE-PHENIRAMINE 0.025-0.3 % OP SOLN: 2.0000 [drp] | Freq: Two times a day (BID) | OPHTHALMIC | 0 refills | Status: DC | PRN

## 2018-04-14 MED ORDER — FLUTICASONE PROPIONATE 50 MCG/ACT NA SUSP: 2.0000 | Freq: Every day | NASAL | 6 refills | Status: DC

## 2018-04-14 NOTE — Progress Notes (Signed)
Patient wants to increase Phentermine to higher dose because it is a tablet and she can split it and take 1/2 in the morning and 1/2 in the afternoon and that has worked before for her.

## 2018-04-14 NOTE — Progress Notes (Signed)
Subjective:    Patient ID: Alicia Sanders, female    DOB: 19-Mar-1980, 38 y.o.   MRN: [id number]  HPI  .Marland Kitchen.Virtual Visit via Video Note  I connected with Alicia Sanders on 04/14/18 at  8:30 AM EDT by a video enabled telemedicine application and verified that I am speaking with the correct person using two identifiers.   I discussed the limitations of evaluation and management by telemedicine and the availability of in person appointments. The patient expressed understanding and agreed to proceed.  History of Present Illness:  Pt is a 38 yo female who comes in to follow up on weight loss. She was not able to afford saxenda. She did agree to phentermine. She is doing well on 15mg  of phentermine. She is eating less and more active. She has been furloughed due to COVID pandemic. She denies any increase in anxiety. She is having a little trouble staying asleep since starting. Her mother just got dx with endometrial cancer and that has her concerned. She is down from 178 to 171. She would like to increase to full tablet. She feels like she did great on this before and lost the weight.   .. Active Ambulatory Problems    Diagnosis Date Noted  . Non-seasonal allergic rhinitis 11/26/2007  . BREAST CYST 01/19/2009  . Hypothyroidism 07/11/2010  . Preventive measure 05/08/2012  . DDD (degenerative disc disease), lumbar 05/08/2012  . Weight gain 04/18/2015  . 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 Dysphagia 01/27/2016  . Mood disorder (HCC) 01/27/2016  . DDD (degenerative disc disease), cervical 01/27/2016  . Pleuritic chest pain 01/27/2016  . Overweight (BMI 25.0-29.9) 06/25/2016  . Other viral warts 06/26/2016  . Acute cystitis 10/11/2016  . Cough 01/23/2017  . RLQ abdominal pain 01/28/2017  . 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 Abnormal CT of the abdomen 09/23/2017  . Nausea 09/23/2017  . Change in bowel habits 09/23/2017  . Hirsutism 03/05/2018  . Bloating 03/05/2018  . Bilateral lower  abdominal discomfort 03/05/2018  . No energy 03/05/2018   Resolved Ambulatory Problems    Diagnosis Date Noted  . Dermatophytosis of nail 09/04/2007  . ANXIETY STATE NOS 10/22/2006  . OTITIS MEDIA, SEROUS, ACUTE, RIGHT 12/03/2009  . HOT FLASHES 10/16/2006  . DERMATITIS 09/07/2009  . SKIN RASH 09/04/2007  . SYMPTOM, ENLARGEMENT, LYMPH NODES 10/16/2006  . OTITIS EXTERNA 07/11/2010  . LOW BACK PAIN, ACUTE 08/03/2010  . BACK STRAIN, THORACIC 08/03/2010  . Acute maxillary sinusitis 05/08/2012  . Dysfunction of right eustachian tube 06/03/2012  . Trochanteric bursitis of left hip 08/28/2012  . Plantar fasciitis, right 04/18/2015  . Acute maxillary sinusitis 10/26/2015   Past Medical History:  Diagnosis Date  . Back problem   . Eczema       Review of Systems See HPI>     Objective:   Physical Exam Vitals signs reviewed.  Constitutional:      Appearance: Normal appearance.  HENT:     Head: Normocephalic.  Cardiovascular:     Rate and Rhythm: Normal rate.  Pulmonary:     Effort: Pulmonary effort is normal.  Neurological:     General: No focal deficit present.     Mental Status: She is alert.  Psychiatric:        Mood and Affect: Mood normal.    .. Today's Vitals   04/14/18 0816  Pulse: 92  Weight: 171 lb (77.6 kg)  Height: 5\' 5"  (1.651 m)   Body mass index is 28.46 kg/m.  Assessment & Plan:  Marland KitchenMarland KitchenRaheel was seen today for medication refill.  Diagnoses and all orders for this visit:  Overweight (BMI 25.0-29.9) -     phentermine (ADIPEX-P) 37.5 MG tablet; Take 1 tablet (37.5 mg total) by mouth daily before breakfast.   Lost 7lbs. Great job. Vitals great. Keep up good work with diet and exercise. Ok for full tablet. Discussed could make sleep issues worse. If so go down to one tablet. Advised against 1/2 tablet bid. I suggest keeping at 1/2 tablet for more slow weight loss and appetitie suppression. You only need to lost another 10-15lbs. Work on  habits and keeping weight off. Follow up in 2 months.   Follow Up Instructions:    I discussed the assessment and treatment plan with the patient. The patient was provided an opportunity to ask questions and all were answered. The patient agreed with the plan and demonstrated an understanding of the instructions.   The patient was advised to call back or seek an in-person evaluation if the symptoms worsen or if the condition fails to improve as anticipated.  I provided 10 minutes of non-face-to-face time during this encounter.   Tandy Gaw, PA-C

## 2018-04-14 NOTE — Progress Notes (Signed)
E visit for Allergic Rhinitis We are sorry that you are not feeling well.  Here is how we plan to help!  Based on what you have shared with me it looks like you have Allergic Rhinitis.  Rhinitis is when a reaction occurs that causes nasal congestion, runny nose, sneezing, and itching.  Most types of rhinitis are caused by an inflammation and are associated with symptoms in the eyes ears or throat. There are several types of rhinitis.  The most common are acute rhinitis, which is usually caused by a viral illness, allergic or seasonal rhinitis, and nonallergic or year-round rhinitis.  Nasal allergies occur certain times of the year.  Allergic rhinitis is caused when allergens in the air trigger the release of histamine in the body.  Histamine causes itching, swelling, and fluid to build up in the fragile linings of the nasal passages, sinuses and eyelids.  An itchy nose and clear discharge are common.   You may continue taking Cetirizine once daily. I would recommend switching to Flonase 2 sprays into each nostril once daily. I have prescribed the eye drops below.    You may also benefit from eye drops such as: Visine 1-2 drops each eye twice daily as needed  HOME CARE:   You can use an over-the-counter saline nasal spray as needed  Avoid areas where there is heavy dust, mites, or molds  Stay indoors on windy days during the pollen season  Keep windows closed in home, at least in bedroom; use air conditioner.  Use high-efficiency house air filter  Keep windows closed in car, turn AC on re-circulate  Avoid playing out with dog during pollen season  GET HELP RIGHT AWAY IF:   If your symptoms do not improve within 10 days  You become short of breath  You develop yellow or green discharge from your nose for over 3 days  You have coughing fits  MAKE SURE YOU:   Understand these instructions  Will watch your condition  Will get help right away if you are not doing well or  get worse  Thank you for choosing an e-visit. Your e-visit answers were reviewed by a board certified advanced clinical practitioner to complete your personal care plan. Depending upon the condition, your plan could have included both over the counter or prescription medications. Please review your pharmacy choice. Be sure that the pharmacy you have chosen is open so that you can pick up your prescription now.  If there is a problem you may message your provider in MyChart to have the prescription routed to another pharmacy. Your safety is important to [place]. If you have drug allergies check your prescription carefully.  For the next 24 hours, you can use MyChart to ask questions about today's visit, request a non-urgent call back, or ask for a work or school excuse from your e-visit provider. You will get an email in the next two days asking about your experience. I hope that your e-visit has been valuable and will speed your recovery.   I have spent 7 min in completion and review of this note- Illa Level [facility]

## 2018-04-15 NOTE — Telephone Encounter (Signed)
Done

## 2018-04-16 ENCOUNTER — Encounter: Payer: Self-pay | Admitting: Physician Assistant

## 2018-04-18 MED ORDER — TRAMADOL HCL 50 MG PO TABS: 50.0000 mg | ORAL_TABLET | Freq: Three times a day (TID) | ORAL | 0 refills | Status: DC | PRN

## 2018-04-18 NOTE — Telephone Encounter (Signed)
Done.  I spent 5 total minutes of online digital evaluation and management services. 

## 2018-05-05 ENCOUNTER — Other Ambulatory Visit: Payer: Self-pay | Admitting: Physician Assistant

## 2018-05-05 DIAGNOSIS — R7989 Other specified abnormal findings of blood chemistry: Secondary | ICD-10-CM

## 2018-05-13 ENCOUNTER — Other Ambulatory Visit: Payer: Self-pay | Admitting: Physician Assistant

## 2018-05-29 ENCOUNTER — Other Ambulatory Visit: Payer: Self-pay | Admitting: Physician Assistant

## 2018-06-06 IMAGING — DX DG CHEST 2V
2 series · 2 of 2 positions shown · non-contrast
Comparison: None.

CLINICAL DATA: Shortness of breath and left-sided chest pain on
inspiration, initial encounter

EXAM:
CHEST  2 VIEW

[chest pa]
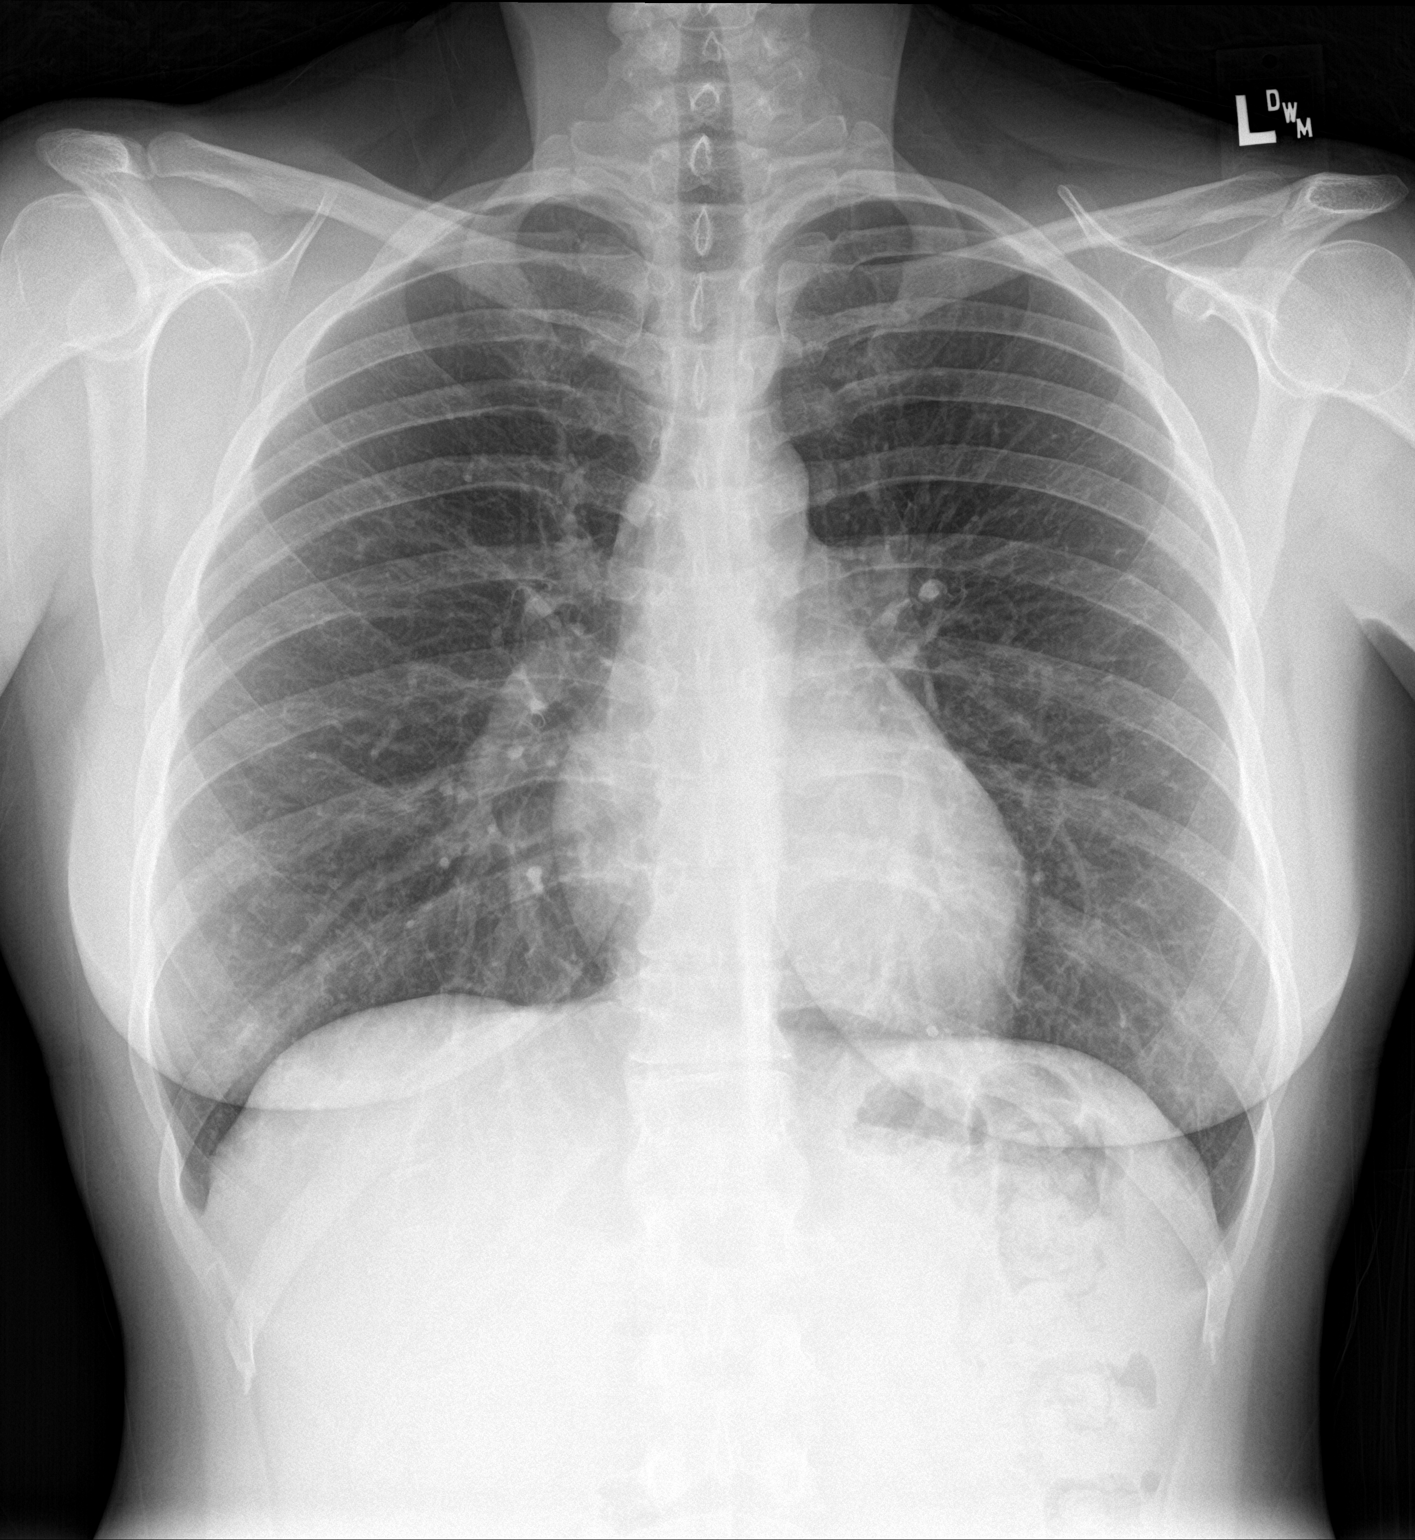

[chest lat]
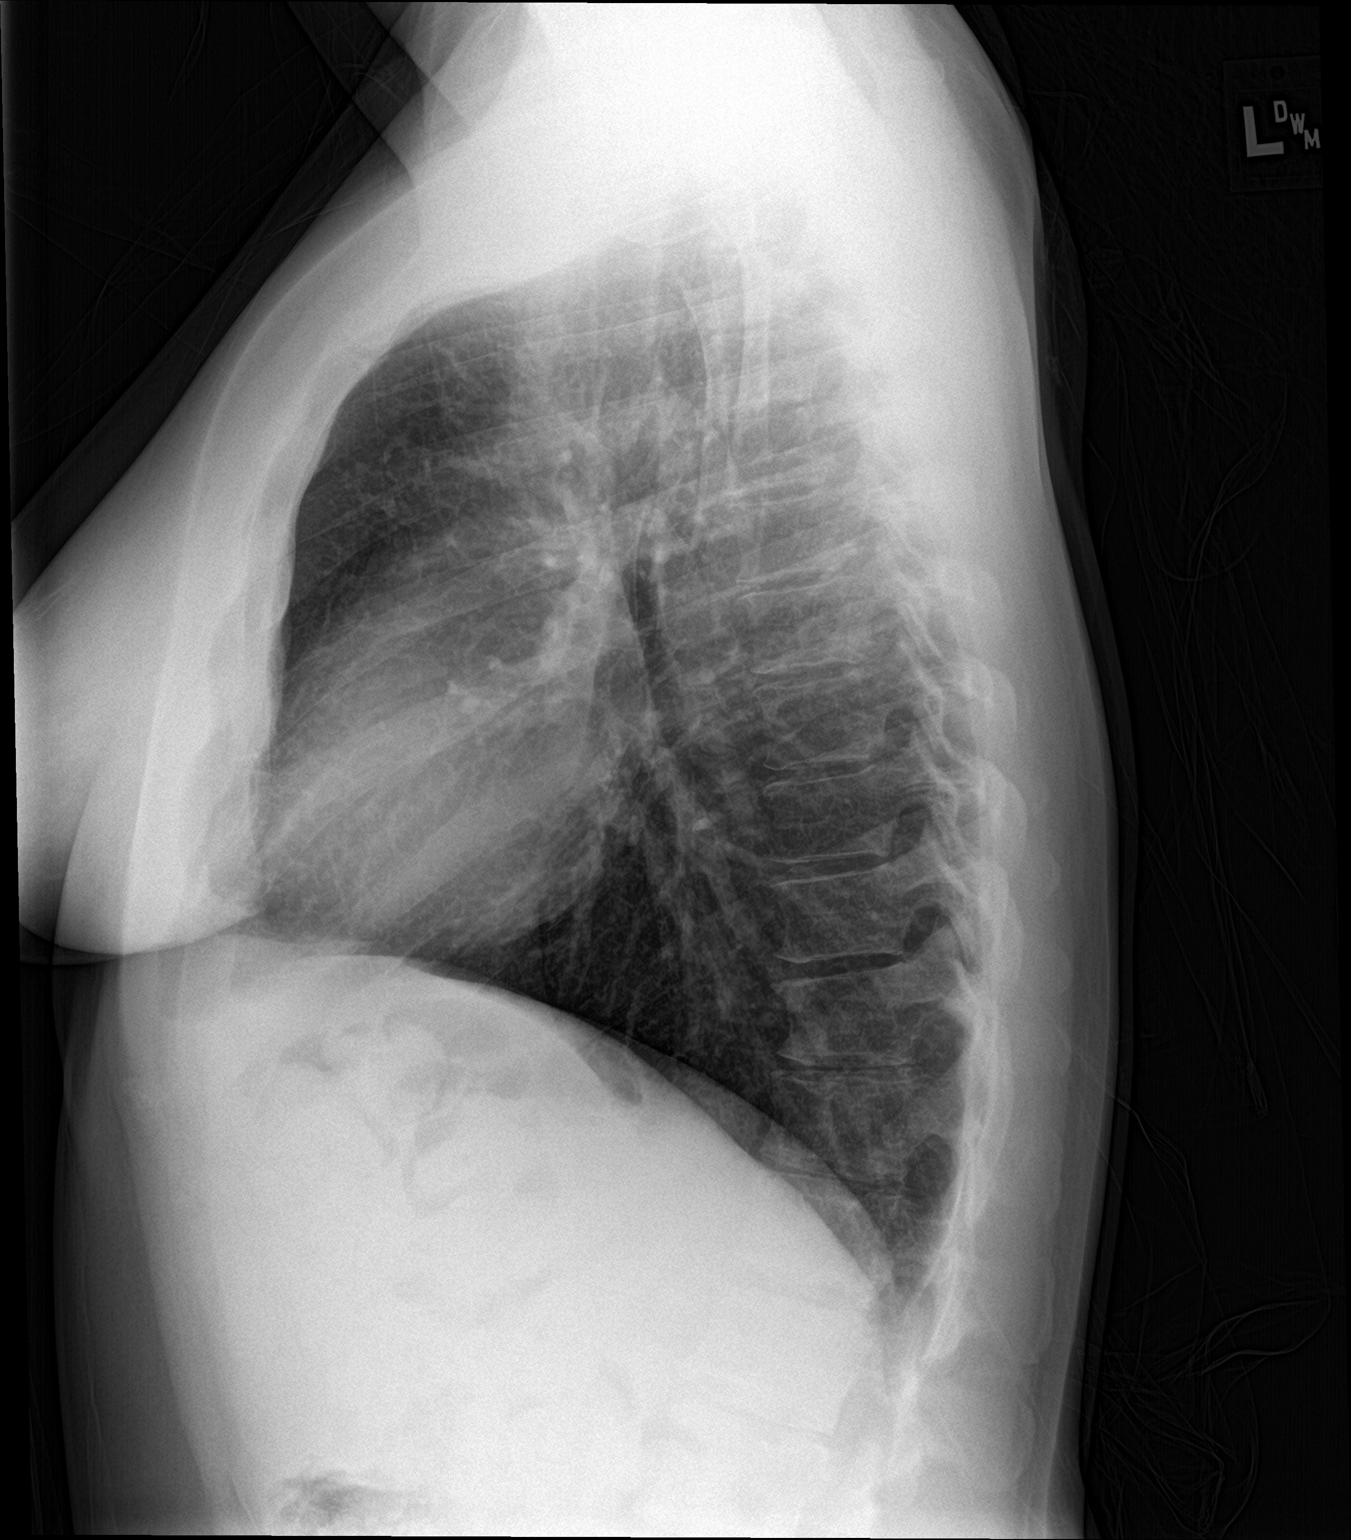

[2 of 2 positions shown; findings below may reference images not displayed]

FINDINGS: The heart size and mediastinal contours are within normal limits.
Both lungs are clear. The visualized skeletal structures are
unremarkable.
IMPRESSION: No active cardiopulmonary disease.

## 2018-06-09 ENCOUNTER — Other Ambulatory Visit: Payer: Self-pay | Admitting: Physician Assistant

## 2018-06-09 DIAGNOSIS — F39 Unspecified mood [affective] disorder: Secondary | ICD-10-CM

## 2018-06-18 ENCOUNTER — Encounter: Payer: Self-pay | Admitting: Physician Assistant

## 2018-07-06 ENCOUNTER — Other Ambulatory Visit: Payer: Self-pay | Admitting: Physician Assistant

## 2018-07-06 DIAGNOSIS — F39 Unspecified mood [affective] disorder: Secondary | ICD-10-CM

## 2018-08-11 ENCOUNTER — Other Ambulatory Visit: Payer: Self-pay | Admitting: Physician Assistant

## 2018-08-22 ENCOUNTER — Other Ambulatory Visit: Payer: Self-pay | Admitting: Sports Medicine

## 2018-08-23 ENCOUNTER — Telehealth: Payer: Self-pay | Admitting: Sports Medicine

## 2018-08-25 NOTE — Telephone Encounter (Signed)
Left patient a voicemail with information below. Let patient know to call us back to schedule an appointment in the office or a virtual visit.  °

## 2018-08-25 NOTE — Telephone Encounter (Signed)
It was declined by Dr. Darene Lamer because it is a controlled substance and we haven't seen you for follow up based on this condition in many months. We need an office visit to get a pain contract signed and a urine drug screen to comply with [place] controlled substance registry. Can you come in and get all these things up to date?

## 2018-08-25 NOTE — Telephone Encounter (Signed)
Patient will need an OV to get her medications. Please call to schedule.

## 2018-10-20 ENCOUNTER — Encounter: Payer: Self-pay | Admitting: Physician Assistant

## 2018-10-21 ENCOUNTER — Ambulatory Visit (INDEPENDENT_AMBULATORY_CARE_PROVIDER_SITE_OTHER): Payer: PRIVATE HEALTH INSURANCE | Admitting: Sports Medicine

## 2018-10-21 DIAGNOSIS — M5030 Other cervical disc degeneration, unspecified cervical region: Secondary | ICD-10-CM | POA: Diagnosis not present

## 2018-10-21 MED ORDER — PREDNISONE 50 MG PO TABS: ORAL_TABLET | ORAL | 0 refills | Status: DC

## 2018-10-21 MED ORDER — TRAMADOL HCL 50 MG PO TABS: 50.0000 mg | ORAL_TABLET | Freq: Three times a day (TID) | ORAL | 0 refills | Status: DC | PRN

## 2018-10-21 NOTE — Progress Notes (Signed)
Virtual Visit via WebEx/MyChart   I connected with  Alicia Sanders  on 10/21/18 via WebEx/MyChart/Doximity Video and verified that I am speaking with the correct person using two identifiers.   I discussed the limitations, risks, security and privacy concerns of performing an evaluation and management service by WebEx/MyChart/Doximity Video, including the higher likelihood of inaccurate diagnosis and treatment, and the availability of in person appointments.  We also discussed the likely need of an additional face to face encounter for complete and high quality delivery of care.  I also discussed with the patient that there may be a patient responsible charge related to this service. The patient expressed understanding and wishes to proceed.  Provider location is either at home or medical facility. Patient location is at their home, different from provider location. People involved in care of the patient during this telehealth encounter were myself, my nurse/medical assistant, and my front office/scheduling team member.  Subjective:    CC: Follow-up  HPI: Alicia Sanders returns, she is pleasant 38 year old female, she has chronic neck and back pain, DDD of the neck and low back, she has been pretty well controlled with occasional tramadol.  I have not seen her in over a year so she needed to catch up again before we refill her medications.  She is having an acute worsening of neck pain, worse with turning to the left, axial without radiation, no progressive weakness, constitutional symptoms, trauma.  I reviewed the past medical history, family history, social history, surgical history, and allergies today and no changes were needed.  Please see the problem list section below in epic for further details.  Past Medical History: Past Medical History:  Diagnosis Date  . Back problem   . Eczema   . Hypothyroidism    Past Surgical History: Past Surgical History:  Procedure Laterality Date  .  HAND SURGERY Right   . INTRAUTERINE DEVICE INSERTION     Social History: Social History   Socioeconomic History  . Marital status: Married    Spouse name: Not on file  . Number of children: 2  . Years of education: Not on file  . Highest education level: Not on file  Occupational History  . Occupation: [employer] tech  Social Needs  . Financial resource strain: Not on file  . Food insecurity    Worry: Not on file    Inability: Not on file  . Transportation needs    Medical: Not on file    Non-medical: Not on file  Tobacco Use  . Smoking status: Never Smoker  . Smokeless tobacco: Never Used  Substance and Sexual Activity  . Alcohol use: Yes    Comment: occasional  . Drug use: Never  . Sexual activity: Not on file  Lifestyle  . Physical activity    Days per week: Not on file    Minutes per session: Not on file  . Stress: Not on file  Relationships  . Social Herbalist on phone: Not on file    Gets together: Not on file    Attends religious service: Not on file    Active member of club or organization: Not on file    Attends meetings of clubs or organizations: Not on file    Relationship status: Not on file  Other Topics Concern  . Not on file  Social History Narrative  . Not on file   Family History: Family History  Problem Relation Age of Onset  . Heart disease  Father   . Heart disease Brother   . Diabetes Maternal Grandmother   . Lung cancer Maternal Grandfather   . Heart disease Paternal Grandfather   . Endometrial cancer Mother   . Colon cancer Neg Hx   . Esophageal cancer Neg Hx   . Inflammatory bowel disease Neg Hx   . Liver disease Neg Hx   . Pancreatic cancer Neg Hx   . Rectal cancer Neg Hx   . Stomach cancer Neg Hx    Allergies: Allergies  Allergen Reactions  . Thimerosal Swelling and Other (See Comments)    Blisters and swelling at injection site; significant malaise.  CAN tolerate preservative-free flu vaccine (10/24/16)   Medications:  See med rec.  Review of Systems: No fevers, chills, night sweats, weight loss, chest pain, or shortness of breath.   Objective:    General: Speaking full sentences, no audible heavy breathing.  Sounds alert and appropriately interactive.  Appears well.  Face symmetric.  Extraocular movements intact.  Pupils equal and round.  No nasal flaring or accessory muscle use visualized.  No other physical exam performed due to the non-physical nature of this visit.  Impression and Recommendations:    DDD (degenerative disc disease), cervical C3-C4 DDD, she never actually got her cervical epidural. Was doing well with occasional tramadol, now having a flare in pain, adding 5 days of prednisone and refilling tramadol. We discussed the developmental anthropology of the spine and the typical course of management. We also discussed expectations.  I discussed the above assessment and treatment plan with the patient. The patient was provided an opportunity to ask questions and all were answered. The patient agreed with the plan and demonstrated an understanding of the instructions.   The patient was advised to call back or seek an in-person evaluation if the symptoms worsen or if the condition fails to improve as anticipated.   I provided 25 minutes of non-face-to-face time during this encounter, 15 minutes of additional time was needed to gather information, review chart, records, communicate/coordinate with staff remotely, troubleshooting the multiple errors that we get every time when trying to do video calls through the electronic medical record, WebEx, and Doximity, restart the encounter multiple times due to instability of the software, as well as complete documentation.   ___________________________________________ Ihor Austin. Benjamin Stain, M.D., ABFM., CAQSM. Primary Care and Sports Medicine [facility]  Adjunct Professor of Family Medicine  [employer]

## 2018-10-21 NOTE — Assessment & Plan Note (Signed)
C3-C4 DDD, she never actually got her cervical epidural. Was doing well with occasional tramadol, now having a flare in pain, adding 5 days of prednisone and refilling tramadol. We discussed the developmental anthropology of the spine and the typical course of management. We also discussed expectations.

## 2018-10-21 NOTE — Addendum Note (Signed)
Addended by: Silverio Decamp on: 10/21/2018 05:11 PM   Modules accepted: Orders

## 2018-10-23 ENCOUNTER — Telehealth: Payer: PRIVATE HEALTH INSURANCE | Admitting: Sports Medicine

## 2018-10-31 ENCOUNTER — Other Ambulatory Visit: Payer: Self-pay | Admitting: Physician Assistant

## 2018-11-07 ENCOUNTER — Other Ambulatory Visit: Payer: Self-pay

## 2018-11-07 ENCOUNTER — Ambulatory Visit (INDEPENDENT_AMBULATORY_CARE_PROVIDER_SITE_OTHER): Payer: PRIVATE HEALTH INSURANCE | Admitting: Physician Assistant

## 2018-11-07 ENCOUNTER — Encounter: Payer: Self-pay | Admitting: Physician Assistant

## 2018-11-07 VITALS — BP 121/72 | Temp 97.6°F | Ht 65.0 in | Wt 166.0 lb

## 2018-11-07 DIAGNOSIS — Z20822 Contact with and (suspected) exposure to covid-19: Secondary | ICD-10-CM

## 2018-11-07 DIAGNOSIS — H9201 Otalgia, right ear: Secondary | ICD-10-CM | POA: Diagnosis not present

## 2018-11-07 DIAGNOSIS — J320 Chronic maxillary sinusitis: Secondary | ICD-10-CM | POA: Diagnosis not present

## 2018-11-07 DIAGNOSIS — R42 Dizziness and giddiness: Secondary | ICD-10-CM

## 2018-11-07 MED ORDER — AMOXICILLIN-POT CLAVULANATE 875-125 MG PO TABS: 1.0000 | ORAL_TABLET | Freq: Two times a day (BID) | ORAL | 0 refills | Status: DC

## 2018-11-07 MED ORDER — METHYLPREDNISOLONE 4 MG PO TBPK: ORAL_TABLET | ORAL | 0 refills | Status: DC

## 2018-11-07 NOTE — Progress Notes (Deleted)
1 week: "haven't felt great"  Yesterday morning - stabbing pain in right ear, felt like room was spinning Right side face - sinus areas hurting Runny nose - yellow drainage this morning

## 2018-11-07 NOTE — Progress Notes (Signed)
Patient ID: Alicia Sanders, female   DOB: 09/23/1980, 38 y.o.   MRN: [id number] .Marland KitchenVirtual Visit via Video Note  I connected with Alicia Sanders on 11/07/18 at 10:10 AM EST by a video enabled telemedicine application and verified that I am speaking with the correct person using two identifiers.  Location: Patient: home Provider: clinic   I discussed the limitations of evaluation and management by telemedicine and the availability of in person appointments. The patient expressed understanding and agreed to proceed.  History of Present Illness: Pt is a 38 yo female who calls into the clinic with 1 week of "not feeling good". Yesterday her symptoms suddenly worsened with right ear pain, right sided face pressure, vertigo, sinus drainage. She denies any fever, chills, body aches, loss of smell or taste, GI symptoms. She has felt nauseated. No other sick contacts. No direct covid exposure but she works at [employer] and around patients all day.   .. Active Ambulatory Problems    Diagnosis Date Noted  . Non-seasonal allergic rhinitis 11/26/2007  . BREAST CYST 01/19/2009  . Hypothyroidism 07/11/2010  . Preventive measure 05/08/2012  . DDD (degenerative disc disease), lumbar 05/08/2012  . Weight gain 04/18/2015  . 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 Dysphagia 01/27/2016  . Mood disorder ([place]) 01/27/2016  . DDD (degenerative disc disease), cervical 01/27/2016  . Pleuritic chest pain 01/27/2016  . Overweight (BMI 25.0-29.9) 06/25/2016  . Other viral warts 06/26/2016  . Acute cystitis 10/11/2016  . Cough 01/23/2017  . RLQ abdominal pain 01/28/2017  . 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 Abnormal CT of the abdomen 09/23/2017  . Nausea 09/23/2017  . Change in bowel habits 09/23/2017  . Hirsutism 03/05/2018  . Bloating 03/05/2018  . Bilateral lower abdominal discomfort 03/05/2018  . No energy 03/05/2018   Resolved Ambulatory Problems    Diagnosis Date Noted  . Dermatophytosis of  nail 09/04/2007  . ANXIETY STATE NOS 10/22/2006  . OTITIS MEDIA, SEROUS, ACUTE, RIGHT 12/03/2009  . HOT FLASHES 10/16/2006  . DERMATITIS 09/07/2009  . SKIN RASH 09/04/2007  . SYMPTOM, ENLARGEMENT, LYMPH NODES 10/16/2006  . OTITIS EXTERNA 07/11/2010  . LOW BACK PAIN, ACUTE 08/03/2010  . BACK STRAIN, THORACIC 08/03/2010  . Acute maxillary sinusitis 05/08/2012  . Dysfunction of right eustachian tube 06/03/2012  . Trochanteric bursitis of left hip 08/28/2012  . Plantar fasciitis, right 04/18/2015  . Acute maxillary sinusitis 10/26/2015   Past Medical History:  Diagnosis Date  . Back problem   . Eczema    Reviewed med, allergy, problem list.    Observations/Objective: No acute distress. No coughing or trouble breathing.  Appears in pain and flushed cheeks.   .. Today's Vitals   11/07/18 0959  BP: 121/72  Temp: 97.6 F (36.4 C)  TempSrc: Oral  Weight: 166 lb (75.3 kg)  Height: 5\' 5"  (1.651 m)   Body mass index is 27.62 kg/m.    Assessment and Plan: Marland KitchenMarland KitchenGreysen was seen today for dizziness and ear problem.  Diagnoses and all orders for this visit:  Right maxillary sinusitis -     amoxicillin-clavulanate (AUGMENTIN) 875-125 MG tablet; Take 1 tablet by mouth 2 (two) times daily. -     methylPREDNISolone (MEDROL DOSEPAK) 4 MG TBPK tablet; Take as directed by package insert.  Right ear pain -     amoxicillin-clavulanate (AUGMENTIN) 875-125 MG tablet; Take 1 tablet by mouth 2 (two) times daily. -     methylPREDNISolone (MEDROL DOSEPAK) 4 MG TBPK tablet; Take as directed by package  insert.  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 Self isolate until results. Treated for sinusitis with augmentin and medrol dose pack. Certainly sinusitis could cause dizziness. Rest and hydrate. Use flonase. Follow up as needed.   Follow Up Instructions:    I discussed the assessment and treatment plan with the patient. The patient was provided an  opportunity to ask questions and all were answered. The patient agreed with the plan and demonstrated an understanding of the instructions.   The patient was advised to call back or seek an in-person evaluation if the symptoms worsen or if the condition fails to improve as anticipated.    Tandy Gaw, PA-C

## 2018-11-08 LAB — NOVEL CORONAVIRUS, NAA: SARS-CoV-2, NAA: NOT DETECTED

## 2018-11-13 ENCOUNTER — Encounter: Payer: Self-pay | Admitting: Physician Assistant

## 2018-11-14 MED ORDER — FLUCONAZOLE 150 MG PO TABS: 150.0000 mg | ORAL_TABLET | Freq: Once | ORAL | 0 refills | Status: AC

## 2018-11-14 NOTE — Telephone Encounter (Signed)
Pended.

## 2019-01-06 ENCOUNTER — Encounter: Payer: Self-pay | Admitting: Nurse Practitioner

## 2019-01-06 ENCOUNTER — Other Ambulatory Visit: Payer: Self-pay

## 2019-01-06 ENCOUNTER — Encounter: Payer: Self-pay | Admitting: Physician Assistant

## 2019-01-06 ENCOUNTER — Ambulatory Visit (INDEPENDENT_AMBULATORY_CARE_PROVIDER_SITE_OTHER): Payer: Managed Care, Other (non HMO) | Admitting: Nurse Practitioner

## 2019-01-06 VITALS — BP 135/83 | HR 98 | Temp 97.7°F | Ht 65.0 in | Wt 180.1 lb

## 2019-01-06 DIAGNOSIS — R1031 Right lower quadrant pain: Secondary | ICD-10-CM

## 2019-01-06 LAB — POCT URINE PREGNANCY: Preg Test, Ur: NEGATIVE

## 2019-01-06 MED ORDER — OXYCODONE-ACETAMINOPHEN 5-325 MG PO TABS: 1.0000 | ORAL_TABLET | Freq: Four times a day (QID) | ORAL | 0 refills | Status: DC | PRN

## 2019-01-06 MED ORDER — ONDANSETRON 8 MG PO TBDP: 8.0000 mg | ORAL_TABLET | Freq: Three times a day (TID) | ORAL | 3 refills | Status: DC | PRN

## 2019-01-06 NOTE — Progress Notes (Addendum)
Acute Office Visit  Subjective:    Patient ID: Alicia Sanders, female    DOB: October 02, 1980, 39 y.o.   MRN: [id number]  CC: Severe lower abdominal pain  HPI  Alicia Sanders is a pleasant 39 year old female presenting today for abdominal pain, severe in nature, that started 2 days ago. She reports similar symptoms in the past and was told she had intestinal lymphadenopathy based on CT scan (CT from 07/2017 lists mesenteric adenitis). She also reports of history of ovarian cyst on the right side. She denies trauma or injury. She denies vaginal or urinary symptoms. She denies changes in her bowel habits and reports regular bowel movements. She denies any STIs or new sexual partners. She has an IUD in place and does not currently have periods. She is concerned the symptoms she is having is a repeat of the adenopathy.   ABDOMINAL PAIN  Duration:days Onset: gradual Severity: 7/10 Quality: achy fullness at rest- when bending or hitting it, sharp stabbing pain.  Location:   RLQ and somewhat to the suprapubic area Episode duration: constant Radiation: no Frequency: constant Alleviating factors: laying still helps, but does not relieve the pain completely Aggravating factors: bending over causes the most severe pain.  Status: worse Treatments attempted: Tramadol- no effect Fever: no Nausea: yes- possibly from the pain Vomiting: no Weight loss: no Decreased appetite: no Diarrhea: no Constipation: no Blood in stool: no Heartburn: no Jaundice: no Rash: no Dysuria/urinary frequency: no Hematuria: no History of sexually transmitted disease: no Recurrent NSAID use: no   Past Medical History:  Diagnosis Date  . Back problem   . Eczema   . Hypothyroidism     Past Surgical History:  Procedure Laterality Date  . HAND SURGERY Right   . INTRAUTERINE DEVICE INSERTION      Family History  Problem Relation Age of Onset  . Heart disease Father   . Heart disease Brother   . Diabetes  Maternal Grandmother   . Lung cancer Maternal Grandfather   . Heart disease Paternal Grandfather   . Endometrial cancer Mother   . Colon cancer Neg Hx   . Esophageal cancer Neg Hx   . Inflammatory bowel disease Neg Hx   . Liver disease Neg Hx   . Pancreatic cancer Neg Hx   . Rectal cancer Neg Hx   . Stomach cancer Neg Hx     Social History   Socioeconomic History  . Marital status: Married    Spouse name: Not on file  . Number of children: 2  . Years of education: Not on file  . Highest education level: Not on file  Occupational History  . Occupation: CT tech  Tobacco Use  . Smoking status: Never Smoker  . Smokeless tobacco: Never Used  Substance and Sexual Activity  . Alcohol use: Yes    Comment: occasional  . Drug use: Never  . Sexual activity: Not on file  Other Topics Concern  . Not on file  Social History Narrative  . Not on file   Social Determinants of Health   Financial Resource Strain:   . Difficulty of Paying Living Expenses: Not on file  Food Insecurity:   . Worried About Programme researcher, broadcasting/film/video in the Last Year: Not on file  . Ran Out of Food in the Last Year: Not on file  Transportation Needs:   . Lack of Transportation (Medical): Not on file  . Lack of Transportation (Non-Medical): Not on file  Physical Activity:   .  Days of Exercise per Week: Not on file  . Minutes of Exercise per Session: Not on file  Stress:   . Feeling of Stress : Not on file  Social Connections:   . Frequency of Communication with Friends and Family: Not on file  . Frequency of Social Gatherings with Friends and Family: Not on file  . Attends Religious Services: Not on file  . Active Member of Clubs or Organizations: Not on file  . Attends Archivist Meetings: Not on file  . Marital Status: Not on file  Intimate Partner Violence:   . Fear of Current or Ex-Partner: Not on file  . Emotionally Abused: Not on file  . Physically Abused: Not on file  . Sexually Abused:  Not on file    Outpatient Medications Prior to Visit  Medication Sig Dispense Refill  . albuterol (PROVENTIL HFA;VENTOLIN HFA) 108 (90 Base) MCG/ACT inhaler 2 puffs 15 minutes before physical activity and exercise 2 Inhaler 11  . buPROPion (WELLBUTRIN XL) 300 MG 24 hr tablet PLEASE SEE ATTACHED FOR DETAILED DIRECTIONS 90 tablet 3  . clobetasol cream (TEMOVATE) 4.40 % Apply 1 application topically 2 (two) times daily. 60 g 2  . Crisaborole (EUCRISA) 2 % OINT Apply 1 application topically 2 (two) times daily. 100 g 1  . levothyroxine (SYNTHROID) 50 MCG tablet TAKE 1 TABLET BY MOUTH EVERY DAY 90 tablet 1  . montelukast (SINGULAIR) 10 MG tablet TAKE 1 TABLET BY MOUTH EVERYDAY AT BEDTIME 30 tablet 11  . traMADol (ULTRAM) 50 MG tablet Take 1 tablet (50 mg total) by mouth 3 (three) times daily as needed. 270 tablet 0  . amoxicillin-clavulanate (AUGMENTIN) 875-125 MG tablet Take 1 tablet by mouth 2 (two) times daily. (Patient not taking: Reported on 01/06/2019) 20 tablet 0  . cholecalciferol (VITAMIN D3) 25 MCG (1000 UT) tablet Take 1,000 Units by mouth daily.    . clonazePAM (KLONOPIN) 0.5 MG tablet Take one tablet as needed for acute anxiety. (Patient not taking: Reported on 01/06/2019) 20 tablet 0  . methylPREDNISolone (MEDROL DOSEPAK) 4 MG TBPK tablet Take as directed by package insert. (Patient not taking: Reported on 01/06/2019) 21 tablet 0  . naphazoline-pheniramine (NAPHCON-A) 0.025-0.3 % ophthalmic solution Place 2 drops into both eyes 2 (two) times daily as needed for eye irritation. (Patient not taking: Reported on 01/06/2019) 15 mL 0   No facility-administered medications prior to visit.    Allergies  Allergen Reactions  . Thimerosal Swelling and Other (See Comments)    Blisters and swelling at injection site; significant malaise.  CAN tolerate preservative-free flu vaccine (10/24/16)    Review of Systems  Constitutional: Negative for appetite change, chills, fever and unexpected weight  change.  Respiratory: Negative for cough and shortness of breath.   Gastrointestinal: Positive for abdominal distention, abdominal pain and nausea. Negative for blood in stool, constipation, diarrhea, rectal pain and vomiting.  Endocrine: Negative for polydipsia.  Genitourinary: Negative for difficulty urinating, dyspareunia, dysuria, flank pain, frequency, genital sores, menstrual problem, pelvic pain, urgency, vaginal bleeding, vaginal discharge and vaginal pain.  Skin: Negative for color change and rash.       Objective:    Physical Exam Vitals reviewed.  Constitutional:      General: She is not in acute distress.    Appearance: She is well-developed.  Cardiovascular:     Rate and Rhythm: Normal rate and regular rhythm.  Pulmonary:     Effort: Pulmonary effort is normal.     Breath sounds:  Normal breath sounds.  Abdominal:     General: Bowel sounds are normal. There is distension. There is no abdominal bruit. There are no signs of injury.     Palpations: Abdomen is soft. There is no hepatomegaly, splenomegaly, mass or pulsatile mass.     Tenderness: There is abdominal tenderness in the right lower quadrant and suprapubic area. There is no right CVA tenderness or left CVA tenderness. Negative signs include Murphy's sign and McBurney's sign.     Hernia: No hernia is present.     Comments: Guarding present over the lower quadrants bilaterally.  No discoloration noted  Skin:    General: Skin is warm and dry.  Neurological:     Mental Status: She is alert.  Psychiatric:        Mood and Affect: Mood normal.        Behavior: Behavior normal.     BP 135/83   Pulse 98   Temp 97.7 F (36.5 C) (Oral)   Ht 5\' 5"  (1.651 m)   Wt 180 lb 1.9 oz (81.7 kg)   BMI 29.97 kg/m  Wt Readings from Last 3 Encounters:  01/06/19 180 lb 1.9 oz (81.7 kg)  11/07/18 166 lb (75.3 kg)  04/14/18 171 lb (77.6 kg)    Health Maintenance Due  Topic Date Due  . HIV Screening  12/22/1995  .  TETANUS/TDAP  12/22/1999  . PAP SMEAR-Modifier  12/21/2001    There are no preventive care reminders to display for this patient.   Lab Results  Component Value Date   TSH 1.13 02/25/2018   Lab Results  Component Value Date   WBC 8.6 02/25/2018   HGB 14.3 02/25/2018   HCT 41.3 02/25/2018   MCV 85.5 02/25/2018   PLT 270 02/25/2018   Lab Results  Component Value Date   NA 138 02/25/2018   K 4.1 02/25/2018   CO2 27 02/25/2018   GLUCOSE 76 02/25/2018   BUN 11 02/25/2018   CREATININE 0.70 02/25/2018   BILITOT 0.4 02/25/2018   ALKPHOS 73 09/23/2017   AST 14 02/25/2018   ALT 18 02/25/2018   PROT 6.9 02/25/2018   ALBUMIN 4.4 09/23/2017   CALCIUM 9.0 02/25/2018   GFR 87.16 09/23/2017   Lab Results  Component Value Date   CHOL 128 09/27/2015   Lab Results  Component Value Date   HDL 40 (L) 09/27/2015   Lab Results  Component Value Date   LDLCALC 79 09/27/2015   Lab Results  Component Value Date   TRIG 46 09/27/2015   Lab Results  Component Value Date   CHOLHDL 3.2 09/27/2015   Lab Results  Component Value Date   HGBA1C 4.8 09/27/2015       Assessment & Plan:   Problem List Items Addressed This Visit      Other   Acute abdominal pain in right lower quadrant - Primary    UA negative. Urine pregnancy negative.  Abdominal CT with contrast ordered to be performed at [facility] per pt request (she is employed there). Contrast provided.  Discussed with patient emergency signs to watch for and to immediately go to the emergency room if symptoms develop- Pt expressed understanding.  CBC and CMP ordered. Script for zofran and percocet sent to pharmacy.         Relevant Medications   ondansetron (ZOFRAN-ODT) 8 MG disintegrating tablet   oxyCODONE-acetaminophen (PERCOCET/ROXICET) 5-325 MG tablet   Other Relevant Orders   CT Abdomen Pelvis W Contrast  CBC   Comprehensive Metabolic Panel (CMET)   POCT urine pregnancy (Completed)       Meds ordered  this encounter  Medications  . ondansetron (ZOFRAN-ODT) 8 MG disintegrating tablet    Sig: Take 1 tablet (8 mg total) by mouth every 8 (eight) hours as needed for nausea.    Dispense:  20 tablet    Refill:  3    Order Specific Question:   Supervising Provider    Answer:   Stacie Glaze [[id number]]  . oxyCODONE-acetaminophen (PERCOCET/ROXICET) 5-325 MG tablet    Sig: Take 1-2 tablets by mouth every 6 (six) hours as needed for up to 3 days for severe pain.    Dispense:  24 tablet    Refill:  0    Order Specific Question:   Supervising Provider    Answer:   Stacie Glaze [[id number]]   Return if symptoms worsen or fail to improve.  Tollie Eth, NP

## 2019-01-06 NOTE — Assessment & Plan Note (Addendum)
UA negative. Urine pregnancy negative.  Abdominal CT with contrast ordered to be performed at [facility] per pt request (she is employed there). Contrast provided.  Discussed with patient emergency signs to watch for and to immediately go to the emergency room if symptoms develop- Pt expressed understanding.  CBC and CMP ordered. Script for zofran and percocet sent to pharmacy.

## 2019-01-06 NOTE — Patient Instructions (Signed)
Go to the nearest emergency room immediately if you experience any of the following symptoms:  Severe abdominal pain Fever Nausea/Vomiting  Rigidity of the abdomen  Based on the results of the CT and your labs we will proceed. Do not hesitate to call if you need further assistance.

## 2019-01-07 ENCOUNTER — Encounter: Payer: Self-pay | Admitting: Nurse Practitioner

## 2019-01-07 ENCOUNTER — Other Ambulatory Visit: Payer: Self-pay | Admitting: Nurse Practitioner

## 2019-01-07 DIAGNOSIS — E079 Disorder of thyroid, unspecified: Secondary | ICD-10-CM | POA: Insufficient documentation

## 2019-01-08 ENCOUNTER — Encounter (HOSPITAL_COMMUNITY): Payer: Self-pay

## 2019-01-08 ENCOUNTER — Encounter (HOSPITAL_COMMUNITY)
Admission: EM | Disposition: A | Discharge status: Home / Self Care | Payer: Self-pay | Source: Home / Self Care | Attending: Emergency Medicine

## 2019-01-08 ENCOUNTER — Other Ambulatory Visit: Payer: Self-pay

## 2019-01-08 ENCOUNTER — Observation Stay (HOSPITAL_COMMUNITY)
Admission: EM | Admit: 2019-01-08 | Discharge: 2019-01-08 | Disposition: A | Discharge status: Home / Self Care | Payer: Managed Care, Other (non HMO) | Attending: Surgery | Admitting: Surgery

## 2019-01-08 ENCOUNTER — Observation Stay (HOSPITAL_COMMUNITY): Payer: Managed Care, Other (non HMO) | Admitting: Certified Registered"

## 2019-01-08 ENCOUNTER — Ambulatory Visit
Admission: RE | Admit: 2019-01-08 | Discharge: 2019-01-08 | Disposition: A | Discharge status: Home / Self Care | Payer: Managed Care, Other (non HMO) | Source: Ambulatory Visit | Attending: Nurse Practitioner | Admitting: Nurse Practitioner

## 2019-01-08 DIAGNOSIS — Z79899 Other long term (current) drug therapy: Secondary | ICD-10-CM | POA: Diagnosis not present

## 2019-01-08 DIAGNOSIS — R109 Unspecified abdominal pain: Secondary | ICD-10-CM | POA: Diagnosis present

## 2019-01-08 DIAGNOSIS — E039 Hypothyroidism, unspecified: Secondary | ICD-10-CM | POA: Diagnosis not present

## 2019-01-08 DIAGNOSIS — K37 Unspecified appendicitis: Secondary | ICD-10-CM | POA: Diagnosis not present

## 2019-01-08 DIAGNOSIS — Z20822 Contact with and (suspected) exposure to covid-19: Secondary | ICD-10-CM | POA: Insufficient documentation

## 2019-01-08 DIAGNOSIS — K3580 Unspecified acute appendicitis: Secondary | ICD-10-CM | POA: Diagnosis present

## 2019-01-08 DIAGNOSIS — J45909 Unspecified asthma, uncomplicated: Secondary | ICD-10-CM | POA: Diagnosis not present

## 2019-01-08 HISTORY — PX: LAPAROSCOPIC APPENDECTOMY: SHX408

## 2019-01-08 LAB — COMPREHENSIVE METABOLIC PANEL
ALT: 22 U/L (ref 0–44)
AST: 16 U/L (ref 15–41)
Albumin: 4.4 g/dL (ref 3.5–5.0)
Alkaline Phosphatase: 70 U/L (ref 38–126)
Anion gap: 9 (ref 5–15)
BUN: 9 mg/dL (ref 6–20)
CO2: 24 mmol/L (ref 22–32)
Calcium: 9 mg/dL (ref 8.9–10.3)
Chloride: 104 mmol/L (ref 98–111)
Creatinine, Ser: 0.62 mg/dL (ref 0.44–1.00)
GFR calc Af Amer: >60 mL/min (ref >60)
GFR calc non Af Amer: >60 mL/min (ref >60)
Glucose, Bld: 82 mg/dL (ref 70–99)
Potassium: 3.7 mmol/L (ref 3.5–5.1)
Sodium: 137 mmol/L (ref 135–145)
Total Bilirubin: 0.7 mg/dL (ref 0.3–1.2)
Total Protein: 7.9 g/dL (ref 6.5–8.1)

## 2019-01-08 LAB — RESPIRATORY PANEL BY RT PCR (FLU A&B, COVID)
Influenza A by PCR: NEGATIVE
Influenza B by PCR: NEGATIVE
SARS Coronavirus 2 by RT PCR: NEGATIVE

## 2019-01-08 LAB — URINALYSIS, ROUTINE W REFLEX MICROSCOPIC
Bilirubin Urine: NEGATIVE
Glucose, UA: NEGATIVE mg/dL
Hgb urine dipstick: NEGATIVE
Ketones, ur: NEGATIVE mg/dL
Leukocytes,Ua: NEGATIVE
Nitrite: NEGATIVE
Protein, ur: NEGATIVE mg/dL
Specific Gravity, Urine: 1.041 — ABNORMAL HIGH (ref 1.005–1.030)
pH: 6 (ref 5.0–8.0)

## 2019-01-08 LAB — CBC
HCT: 42.9 % (ref 36.0–46.0)
Hemoglobin: 14.1 g/dL (ref 12.0–15.0)
MCH: 29.4 pg (ref 26.0–34.0)
MCHC: 32.9 g/dL (ref 30.0–36.0)
MCV: 89.4 fL (ref 80.0–100.0)
Platelets: 254 10*3/uL (ref 150–400)
RBC: 4.8 MIL/uL (ref 3.87–5.11)
RDW: 13.2 % (ref 11.5–15.5)
WBC: 9.1 10*3/uL (ref 4.0–10.5)
nRBC: 0 % (ref 0.0–0.2)

## 2019-01-08 LAB — HCG, QUANTITATIVE, PREGNANCY: hCG, Beta Chain, Quant, S: <1 m[IU]/mL (ref <5)

## 2019-01-08 LAB — LIPASE, BLOOD: Lipase: 28 U/L (ref 11–51)

## 2019-01-08 SURGERY — APPENDECTOMY, LAPAROSCOPIC
Anesthesia: General

## 2019-01-08 MED ORDER — ONDANSETRON HCL 4 MG/2ML IJ SOLN
4.0000 mg | Freq: Once | INTRAMUSCULAR | Status: AC
  Administered 2019-01-08: 14:00:00 4 mg via INTRAVENOUS
  Filled 2019-01-08: qty 2

## 2019-01-08 MED ORDER — PROMETHAZINE HCL 25 MG/ML IJ SOLN: 6.2500 mg | INTRAMUSCULAR | Status: DC | PRN

## 2019-01-08 MED ORDER — FENTANYL CITRATE (PF) 100 MCG/2ML IJ SOLN: 25.0000 ug | INTRAMUSCULAR | Status: DC | PRN

## 2019-01-08 MED ORDER — FENTANYL CITRATE (PF) 250 MCG/5ML IJ SOLN
INTRAMUSCULAR | Status: AC
  Filled 2019-01-08: qty 5

## 2019-01-08 MED ORDER — ACETAMINOPHEN 500 MG PO TABS: 1000.0000 mg | ORAL_TABLET | Freq: Four times a day (QID) | ORAL | Status: DC

## 2019-01-08 MED ORDER — LACTATED RINGERS IV SOLN: INTRAVENOUS | Status: DC

## 2019-01-08 MED ORDER — OXYCODONE HCL 5 MG PO TABS: 5.0000 mg | ORAL_TABLET | Freq: Four times a day (QID) | ORAL | 0 refills | Status: DC | PRN

## 2019-01-08 MED ORDER — BUPIVACAINE HCL (PF) 0.25 % IJ SOLN
INTRAMUSCULAR | Status: DC | PRN
  Administered 2019-01-08: 5 mL

## 2019-01-08 MED ORDER — SODIUM CHLORIDE 0.9% FLUSH: 3.0000 mL | INTRAVENOUS | Status: DC | PRN

## 2019-01-08 MED ORDER — MEPERIDINE HCL 50 MG/ML IJ SOLN: 6.2500 mg | INTRAMUSCULAR | Status: DC | PRN

## 2019-01-08 MED ORDER — DEXAMETHASONE SODIUM PHOSPHATE 10 MG/ML IJ SOLN
INTRAMUSCULAR | Status: DC | PRN
  Administered 2019-01-08: 10 mg via INTRAVENOUS

## 2019-01-08 MED ORDER — MORPHINE SULFATE (PF) 4 MG/ML IV SOLN
4.0000 mg | Freq: Once | INTRAVENOUS | Status: AC
  Administered 2019-01-08: 14:00:00 4 mg via INTRAVENOUS
  Filled 2019-01-08: qty 1

## 2019-01-08 MED ORDER — OXYCODONE HCL 5 MG PO TABS
ORAL_TABLET | ORAL | Status: AC
  Filled 2019-01-08: qty 2

## 2019-01-08 MED ORDER — LACTATED RINGERS IR SOLN
Status: DC | PRN
  Administered 2019-01-08: 1000 mL

## 2019-01-08 MED ORDER — FENTANYL CITRATE (PF) 250 MCG/5ML IJ SOLN
INTRAMUSCULAR | Status: DC | PRN
  Administered 2019-01-08: 50 ug via INTRAVENOUS
  Administered 2019-01-08 (×2): 100 ug via INTRAVENOUS

## 2019-01-08 MED ORDER — SUCCINYLCHOLINE CHLORIDE 200 MG/10ML IV SOSY
PREFILLED_SYRINGE | INTRAVENOUS | Status: AC
  Filled 2019-01-08: qty 10

## 2019-01-08 MED ORDER — SODIUM CHLORIDE 0.9% FLUSH: 3.0000 mL | Freq: Two times a day (BID) | INTRAVENOUS | Status: DC

## 2019-01-08 MED ORDER — LIDOCAINE 2% (20 MG/ML) 5 ML SYRINGE
INTRAMUSCULAR | Status: AC
  Filled 2019-01-08: qty 10

## 2019-01-08 MED ORDER — HYDROMORPHONE HCL 1 MG/ML IJ SOLN: 1.0000 mg | INTRAMUSCULAR | Status: DC | PRN

## 2019-01-08 MED ORDER — IOPAMIDOL (ISOVUE-300) INJECTION 61%
100.0000 mL | Freq: Once | INTRAVENOUS | Status: AC | PRN
  Administered 2019-01-08: 11:00:00 100 mL via INTRAVENOUS

## 2019-01-08 MED ORDER — ONDANSETRON 4 MG PO TBDP: 4.0000 mg | ORAL_TABLET | Freq: Four times a day (QID) | ORAL | Status: DC | PRN

## 2019-01-08 MED ORDER — ROCURONIUM BROMIDE 10 MG/ML (PF) SYRINGE
PREFILLED_SYRINGE | INTRAVENOUS | Status: AC
  Filled 2019-01-08: qty 20

## 2019-01-08 MED ORDER — TRAMADOL HCL 50 MG PO TABS: 50.0000 mg | ORAL_TABLET | Freq: Four times a day (QID) | ORAL | Status: DC | PRN

## 2019-01-08 MED ORDER — MIDAZOLAM HCL 5 MG/5ML IJ SOLN
INTRAMUSCULAR | Status: DC | PRN
  Administered 2019-01-08: 2 mg via INTRAVENOUS

## 2019-01-08 MED ORDER — SODIUM CHLORIDE 0.9 % IV SOLN: 250.0000 mL | INTRAVENOUS | Status: DC | PRN

## 2019-01-08 MED ORDER — ACETAMINOPHEN 325 MG PO TABS: 650.0000 mg | ORAL_TABLET | Freq: Four times a day (QID) | ORAL | Status: DC | PRN

## 2019-01-08 MED ORDER — METRONIDAZOLE IN NACL 5-0.79 MG/ML-% IV SOLN
500.0000 mg | Freq: Three times a day (TID) | INTRAVENOUS | Status: DC
  Administered 2019-01-08: 500 mg via INTRAVENOUS
  Filled 2019-01-08: qty 100

## 2019-01-08 MED ORDER — ONDANSETRON HCL 4 MG/2ML IJ SOLN
INTRAMUSCULAR | Status: DC | PRN
  Administered 2019-01-08: 4 mg via INTRAVENOUS

## 2019-01-08 MED ORDER — ACETAMINOPHEN 325 MG PO TABS: 650.0000 mg | ORAL_TABLET | ORAL | Status: DC | PRN

## 2019-01-08 MED ORDER — SODIUM CHLORIDE 0.9 % IV SOLN
2.0000 g | INTRAVENOUS | Status: DC
  Administered 2019-01-08: 19:00:00 2 g via INTRAVENOUS
  Filled 2019-01-08: qty 20

## 2019-01-08 MED ORDER — ONDANSETRON HCL 4 MG/2ML IJ SOLN: 4.0000 mg | Freq: Four times a day (QID) | INTRAMUSCULAR | Status: DC | PRN

## 2019-01-08 MED ORDER — ROCURONIUM BROMIDE 10 MG/ML (PF) SYRINGE
PREFILLED_SYRINGE | INTRAVENOUS | Status: DC | PRN
  Administered 2019-01-08: 10 mg via INTRAVENOUS
  Administered 2019-01-08: 40 mg via INTRAVENOUS

## 2019-01-08 MED ORDER — PROPOFOL 10 MG/ML IV BOLUS
INTRAVENOUS | Status: DC | PRN
  Administered 2019-01-08: 130 mg via INTRAVENOUS

## 2019-01-08 MED ORDER — KCL IN DEXTROSE-NACL 20-5-0.45 MEQ/L-%-% IV SOLN: INTRAVENOUS | Status: DC

## 2019-01-08 MED ORDER — OXYCODONE HCL 5 MG PO TABS: 5.0000 mg | ORAL_TABLET | ORAL | Status: DC | PRN

## 2019-01-08 MED ORDER — ACETAMINOPHEN 650 MG RE SUPP: 650.0000 mg | Freq: Four times a day (QID) | RECTAL | Status: DC | PRN

## 2019-01-08 MED ORDER — MIDAZOLAM HCL 2 MG/2ML IJ SOLN
INTRAMUSCULAR | Status: AC
  Filled 2019-01-08: qty 2

## 2019-01-08 MED ORDER — SCOPOLAMINE 1 MG/3DAYS TD PT72
MEDICATED_PATCH | TRANSDERMAL | Status: AC
  Filled 2019-01-08: qty 1

## 2019-01-08 MED ORDER — SUCCINYLCHOLINE CHLORIDE 200 MG/10ML IV SOSY
PREFILLED_SYRINGE | INTRAVENOUS | Status: AC
  Filled 2019-01-08: qty 20

## 2019-01-08 MED ORDER — MORPHINE SULFATE (PF) 4 MG/ML IV SOLN
4.0000 mg | Freq: Once | INTRAVENOUS | Status: AC
  Administered 2019-01-08: 16:00:00 4 mg via INTRAVENOUS
  Filled 2019-01-08: qty 1

## 2019-01-08 MED ORDER — SUCCINYLCHOLINE CHLORIDE 200 MG/10ML IV SOSY
PREFILLED_SYRINGE | INTRAVENOUS | Status: DC | PRN
  Administered 2019-01-08: 120 mg via INTRAVENOUS

## 2019-01-08 MED ORDER — ACETAMINOPHEN 160 MG/5ML PO SOLN: 325.0000 mg | Freq: Once | ORAL | Status: DC | PRN

## 2019-01-08 MED ORDER — SCOPOLAMINE 1 MG/3DAYS TD PT72
MEDICATED_PATCH | TRANSDERMAL | Status: DC | PRN
  Administered 2019-01-08: 1 via TRANSDERMAL

## 2019-01-08 MED ORDER — SODIUM CHLORIDE 0.9% FLUSH
3.0000 mL | Freq: Once | INTRAVENOUS | Status: AC
  Administered 2019-01-08: 14:00:00 3 mL via INTRAVENOUS

## 2019-01-08 MED ORDER — ACETAMINOPHEN 10 MG/ML IV SOLN: 1000.0000 mg | Freq: Once | INTRAVENOUS | Status: DC | PRN

## 2019-01-08 MED ORDER — ACETAMINOPHEN 325 MG PO TABS: 325.0000 mg | ORAL_TABLET | Freq: Once | ORAL | Status: DC | PRN

## 2019-01-08 MED ORDER — BUPIVACAINE HCL 0.25 % IJ SOLN
INTRAMUSCULAR | Status: AC
  Filled 2019-01-08: qty 1

## 2019-01-08 MED ORDER — HYDROMORPHONE HCL 1 MG/ML IJ SOLN: 0.2500 mg | INTRAMUSCULAR | Status: DC | PRN

## 2019-01-08 MED ORDER — ACETAMINOPHEN 650 MG RE SUPP
650.0000 mg | RECTAL | Status: DC | PRN
  Filled 2019-01-08: qty 1

## 2019-01-08 MED ORDER — SUGAMMADEX SODIUM 200 MG/2ML IV SOLN
INTRAVENOUS | Status: DC | PRN
  Administered 2019-01-08: 200 mg via INTRAVENOUS

## 2019-01-08 MED ORDER — LIDOCAINE 2% (20 MG/ML) 5 ML SYRINGE
INTRAMUSCULAR | Status: DC | PRN
  Administered 2019-01-08: 75 mg via INTRAVENOUS

## 2019-01-08 MED ORDER — OXYCODONE HCL 5 MG PO TABS
5.0000 mg | ORAL_TABLET | ORAL | Status: DC | PRN
  Administered 2019-01-08: 21:00:00 10 mg via ORAL

## 2019-01-08 SURGICAL SUPPLY — 44 items
ADH SKN CLS APL DERMABOND .7 (GAUZE/BANDAGES/DRESSINGS) ×1
APPLIER CLIP ROT 10 11.4 M/L (STAPLE)
APR CLP MED LRG 11.4X10 (STAPLE)
BAG SPEC RTRVL 10 TROC 200 (ENDOMECHANICALS) ×1
BAG SPEC RTRVL LRG 6X4 10 (ENDOMECHANICALS)
CLIP APPLIE ROT 10 11.4 M/L (STAPLE) IMPLANT
COVER WAND RF STERILE (DRAPES) IMPLANT
CUTTER FLEX LINEAR 45M (STAPLE) ×1 IMPLANT
DECANTER SPIKE VIAL GLASS SM (MISCELLANEOUS) ×2 IMPLANT
DERMABOND ADVANCED (GAUZE/BANDAGES/DRESSINGS) ×1
DERMABOND ADVANCED .7 DNX12 (GAUZE/BANDAGES/DRESSINGS) ×1 IMPLANT
DRAPE LAPAROSCOPIC ABDOMINAL (DRAPES) IMPLANT
DRAPE WARM FLUID 44X44 (DRAPES) IMPLANT
ELECT REM PT RETURN 15FT ADLT (MISCELLANEOUS) ×2 IMPLANT
ENDOLOOP SUT PDS II  0 18 (SUTURE)
ENDOLOOP SUT PDS II 0 18 (SUTURE) IMPLANT
GLOVE BIOGEL PI IND STRL 7.0 (GLOVE) ×1 IMPLANT
GLOVE BIOGEL PI INDICATOR 7.0 (GLOVE) ×1
GLOVE INDICATOR 8.0 STRL GRN (GLOVE) ×4 IMPLANT
GLOVE SS BIOGEL STRL SZ 7.5 (GLOVE) ×6 IMPLANT
GLOVE SUPERSENSE BIOGEL SZ 7.5 (GLOVE) ×6
GOWN STRL REUS W/TWL LRG LVL3 (GOWN DISPOSABLE) ×4 IMPLANT
GOWN STRL REUS W/TWL XL LVL3 (GOWN DISPOSABLE) ×4 IMPLANT
KIT BASIN OR (CUSTOM PROCEDURE TRAY) ×2 IMPLANT
KIT TURNOVER KIT A (KITS) ×2 IMPLANT
PENCIL SMOKE EVACUATOR (MISCELLANEOUS) IMPLANT
POUCH RETRIEVAL ECOSAC 10 (ENDOMECHANICALS) ×1 IMPLANT
POUCH RETRIEVAL ECOSAC 10MM (ENDOMECHANICALS) ×1
POUCH SPECIMEN RETRIEVAL 10MM (ENDOMECHANICALS) ×1 IMPLANT
RELOAD 45 VASCULAR/THIN (ENDOMECHANICALS) IMPLANT
RELOAD STAPLE 45 2.5 WHT GRN (ENDOMECHANICALS) IMPLANT
RELOAD STAPLE 45 3.5 BLU ETS (ENDOMECHANICALS) IMPLANT
RELOAD STAPLE TA45 3.5 REG BLU (ENDOMECHANICALS) ×2 IMPLANT
SET IRRIG TUBING LAPAROSCOPIC (IRRIGATION / IRRIGATOR) ×2 IMPLANT
SET TUBE SMOKE EVAC HIGH FLOW (TUBING) ×2 IMPLANT
SHEARS HARMONIC ACE PLUS 36CM (ENDOMECHANICALS) ×2 IMPLANT
SLEEVE XCEL OPT CAN 5 100 (ENDOMECHANICALS) ×1 IMPLANT
SUT MNCRL AB 4-0 PS2 18 (SUTURE) ×2 IMPLANT
TOWEL OR 17X26 10 PK STRL BLUE (TOWEL DISPOSABLE) ×2 IMPLANT
TRAY FOLEY MTR SLVR 16FR STAT (SET/KITS/TRAYS/PACK) ×2 IMPLANT
TRAY LAPAROSCOPIC (CUSTOM PROCEDURE TRAY) ×2 IMPLANT
TROCAR XCEL 12X100 BLDLESS (ENDOMECHANICALS) ×1 IMPLANT
TROCAR XCEL BLUNT TIP 100MML (ENDOMECHANICALS) ×2 IMPLANT
TROCAR XCEL NON-BLD 5MMX100MML (ENDOMECHANICALS) ×2 IMPLANT

## 2019-01-08 NOTE — Anesthesia Procedure Notes (Signed)
Procedure Name: Intubation Date/Time: 01/08/2019 6:40 PM Performed by: Elyn Peers, CRNA Pre-anesthesia Checklist: Patient identified, Emergency Drugs available, Suction available and Patient being monitored Patient Re-evaluated:Patient Re-evaluated prior to induction Oxygen Delivery Method: Circle system utilized Preoxygenation: Pre-oxygenation with 100% oxygen Induction Type: IV induction, Rapid sequence and Cricoid Pressure applied Laryngoscope Size: Miller and 2 Grade View: Grade I Tube type: Oral Tube size: 7.0 mm Number of attempts: 1 Airway Equipment and Method: Stylet Placement Confirmation: ETT inserted through vocal cords under direct vision,  positive ETCO2 and breath sounds checked- equal and bilateral Secured at: 22 cm Tube secured with: Tape Dental Injury: Teeth and Oropharynx as per pre-operative assessment

## 2019-01-08 NOTE — Discharge Instructions (Signed)
[facility] ______[facility] °LAPAROSCOPIC SURGERY: POST OP INSTRUCTIONS °Always review your discharge instruction sheet given to you by the facility where your surgery was performed. °IF YOU HAVE DISABILITY OR FAMILY LEAVE FORMS, YOU MUST BRING THEM TO THE OFFICE FOR PROCESSING.   °DO NOT GIVE THEM TO YOUR DOCTOR. ° °1. A prescription for pain medication may be given to you upon discharge.  Take your pain medication as prescribed, if needed.  If narcotic pain medicine is not needed, then you may take acetaminophen (Tylenol) or ibuprofen (Advil) as needed. °2. Take your usually prescribed medications unless otherwise directed. °3. If you need a refill on your pain medication, please contact your pharmacy.  They will contact our office to request authorization. Prescriptions will not be filled after 5pm or on week-ends. °4. You should follow a light diet the first few days after arrival home, such as soup and crackers, etc.  Be sure to include lots of fluids daily. °5. Most patients will experience some swelling and bruising in the area of the incisions.  Ice packs will help.  Swelling and bruising can take several days to resolve.  °6. It is common to experience some constipation if taking pain medication after surgery.  Increasing fluid intake and taking a stool softener (such as Colace) will usually help or prevent this problem from occurring.  A mild laxative (Milk of Magnesia or Miralax) should be taken according to package instructions if there are no bowel movements after 48 hours. °7. Unless discharge instructions indicate otherwise, you may remove your bandages 24-48 hours after surgery, and you may shower at that time.  You may have steri-strips (small skin tapes) in place directly over the incision.  These strips should be left on the skin for 7-10 days.  If your surgeon used skin glue on the incision, you may shower in 24 hours.  The glue will flake off over the next 2-3 weeks.  Any sutures or  staples will be removed at the office during your follow-up visit. °8. ACTIVITIES:  You may resume regular (light) daily activities beginning the next day--such as daily self-care, walking, climbing stairs--gradually increasing activities as tolerated.  You may have sexual intercourse when it is comfortable.  Refrain from any heavy lifting or straining until approved by your doctor. °a. You may drive when you are no longer taking prescription pain medication, you can comfortably wear a seatbelt, and you can safely maneuver your car and apply brakes. °b. RETURN TO WORK:  __________________________________________________________ °9. You should see your doctor in the office for a follow-up appointment approximately 2-3 weeks after your surgery.  Make sure that you call for this appointment within a day or two after you arrive home to insure a convenient appointment time. °10. OTHER INSTRUCTIONS: __________________________________________________________________________________________________________________________ __________________________________________________________________________________________________________________________ °WHEN TO CALL YOUR DOCTOR: °1. Fever over 101.0 °2. Inability to urinate °3. Continued bleeding from incision. °4. Increased pain, redness, or drainage from the incision. °5. Increasing abdominal pain ° °The clinic staff is available to answer your questions during regular business hours.  Please don’t hesitate to call and ask to speak to one of the nurses for clinical concerns.  If you have a medical emergency, go to the nearest emergency room or call 911.  A surgeon from [facility] is always on call at the hospital. °[address], [address], [place], [place]  [address] ? [address] [address], [place], [place]   [address] °[phone] ? [phone] ? FAX [phone] °Web site:   [email or web address] °

## 2019-01-08 NOTE — Interval H&P Note (Signed)
History and Physical Interval Note:  01/08/2019 6:30 PM  Alicia Sanders  has presented today for surgery, with the diagnosis of acute appendicitis.  The various methods of treatment have been discussed with the patient and family. After consideration of risks, benefits and other options for treatment, the patient has consented to  Procedure(s): APPENDECTOMY LAPAROSCOPIC (N/A) as a surgical intervention.  The patient's history has been reviewed, patient examined, no change in status, stable for surgery.  I have reviewed the patient's chart and labs.  Questions were answered to the patient's satisfaction.    Pt seen examined and agree Laparoscopic appendectomy  The procedure has been discussed with the patient.  Alternative therapies have been discussed with the patient.  Operative risks include bleeding,  Infection,  Organ injury,  Nerve injury,  Blood vessel injury,  DVT,  Pulmonary embolism,  Death,  And possible reoperation.  Medical management risks include worsening of present situation.  The success of the procedure is 50 -90 % at treating patients symptoms.  The patient understands and agrees to proceed.  Khaleem Burchill A Deshaun Schou

## 2019-01-08 NOTE — H&P (Signed)
[facility] Admission Note  Alicia Sanders Dec 26, 1980  [id number].    Requesting MD: Alicia Sanders Chief Complaint: abdominal pain Reason for Consult: acute appendicitis  HPI: Patient is a 39 year old female who presented with complaints of abdominal pain.  She has a history of right lower quadrant abdominal pain dating back to 2019 with a diagnosis of mesenteric adenitis.  She has a history of hypothyroidism, asthma, anxiety, chronic back pain, obesity and mesenteric adenitis.  She presented on 01/06/2020 Alicia Sanders early Sanders with acute right lower quadrant pain on 01/06/2019.  Studies included Covid were negative.  It looks like labs were ordered but never obtained.  A CT was obtained on 01/06/2019.  This showed an appendix is diffusely thickened with subtle periappendiceal haziness likely representing Alicia Sanders appendicitis.  There is enhancement of the appendix and it measures 11 mm.  She also had an IUD in place a 2.3 cm cyst on the left ovary.  She also has a 9 mm hepatobiliary cyst in the dome of the right lobe of the liver. She was referred to the ED and was seen in triage at 1240 today. Work-up shows her CMP is normal.  WBC 9.1 hemoglobin 14, hematocrit 42.9, platelets 254,000.  She is afebrile, blood pressure 154/102.  We are asked to see.   ROS: Review of Systems  Constitutional: Negative.   HENT: Negative.   Eyes: Negative.   Respiratory: Negative.   Cardiovascular: Negative.   Gastrointestinal: Positive for abdominal pain and nausea. Negative for constipation, diarrhea and vomiting.  Genitourinary: Negative.   Musculoskeletal: Negative.   Skin: Negative.   Neurological: Negative.   Endo/Heme/Allergies: Negative.   Psychiatric/Behavioral: Negative.     Family History  Problem Relation Age of Onset  . Heart disease Father   . Heart disease Brother   . Diabetes Maternal Grandmother   . Lung cancer Maternal Grandfather   . Heart disease Paternal Grandfather   .  Endometrial cancer Mother   . Colon cancer Neg Hx   . Esophageal cancer Neg Hx   . Inflammatory bowel disease Neg Hx   . Liver disease Neg Hx   . Pancreatic cancer Neg Hx   . Rectal cancer Neg Hx   . Stomach cancer Neg Hx     Past Medical History:  Diagnosis Date  . Back problem C3-C4 degenerative disc disease Chronic back pain Hypothyroid   . Bilateral lower abdominal discomfort 03/05/2018  . Bloating 03/05/2018  . Change in bowel habits 09/23/2017  . Cough 01/23/2017  . Eczema   . Hypothyroidism   . No energy 03/05/2018  . Pleuritic chest pain 01/27/2016  . Preventive measure 05/08/2012  . Weight gain 04/18/2015    Past Surgical History:  Procedure Laterality Date  . HAND SURGERY Right   . INTRAUTERINE DEVICE INSERTION      Social History:  reports that she has never smoked. She has never used smokeless tobacco. She reports current alcohol use. She reports that she does not use drugs.  Allergies:  Allergies  Allergen Reactions  . Thimerosal Swelling and Other (See Comments)    Blisters and swelling at injection site; significant malaise.  CAN tolerate preservative-free flu vaccine (10/24/16)  . Adhesive [Tape] Other (See Comments)    Blister and redness/ longer tears skin    Prior to Admission medications   Medication Sig Start Date End Date Taking? Authorizing Alicia Sanders  albuterol (PROVENTIL HFA;VENTOLIN HFA) 108 (90 Base) MCG/ACT inhaler 2 puffs 15 minutes before physical activity and exercise  02/11/17  Yes Alicia Decamp, MD  buPROPion (WELLBUTRIN XL) 300 MG 24 hr tablet PLEASE SEE ATTACHED FOR DETAILED DIRECTIONS Patient taking differently: Take 300 mg by mouth daily.  03/11/18  Yes Alicia Sanders, Alicia Sanders  clobetasol cream (TEMOVATE) 7.82 % Apply 1 application topically 2 (two) times daily. Patient taking differently: Apply 1 application topically daily as needed (ezma).  09/28/15  Yes Alicia Decamp, MD  Crisaborole (EUCRISA) 2 % OINT Apply 1 application  topically 2 (two) times daily. Patient taking differently: Apply 1 application topically daily as needed (ezma).  02/25/18  Yes Alicia Sanders, Alicia Sanders  levothyroxine (SYNTHROID) 50 MCG tablet TAKE 1 TABLET BY MOUTH EVERY DAY Patient taking differently: Take 50 mcg by mouth daily before breakfast.  10/31/18  Yes Alicia Sanders, Alicia Sanders  montelukast (SINGULAIR) 10 MG tablet TAKE 1 TABLET BY MOUTH EVERYDAY AT BEDTIME Patient taking differently: Take 10 mg by mouth at bedtime.  05/29/18  Yes Alicia Sanders, Alicia Sanders  ondansetron (ZOFRAN-ODT) 8 MG disintegrating tablet Take 1 tablet (8 mg total) by mouth every 8 (eight) hours as needed for nausea. 01/06/19  Yes Alicia Sanders  oxyCODONE-acetaminophen (PERCOCET/ROXICET) 5-325 MG tablet Take 1-2 tablets by mouth every 6 (six) hours as needed for up to 3 days for severe pain. 01/06/19 01/09/19 Yes Alicia Sanders  traMADol (ULTRAM) 50 MG tablet Take 1 tablet (50 mg total) by mouth 3 (three) times daily as needed. Patient taking differently: Take 50 mg by mouth 3 (three) times daily as needed for moderate pain.  10/21/18  Yes Alicia Decamp, MD  tretinoin (RETIN-A) 0.05 % cream Apply 1 application topically daily as needed (ezma).  08/08/18  Yes Alicia Sanders, Historical, MD  clonazePAM (KLONOPIN) 0.5 MG tablet Take one tablet as needed for acute anxiety. Patient not taking: Reported on 01/06/2019 03/19/18   Alicia Sanders  methylPREDNISolone (MEDROL DOSEPAK) 4 MG TBPK tablet Take as directed by package insert. Patient not taking: Reported on 01/06/2019 11/07/18   Alicia Sanders  naphazoline-pheniramine (NAPHCON-A) 0.025-0.3 % ophthalmic solution Place 2 drops into both eyes 2 (two) times daily as needed for eye irritation. Patient not taking: Reported on 01/06/2019 04/14/18   Alicia Sanders, Sanders     Blood pressure 139/80, pulse 87, temperature 98.5 F (36.9 C), temperature source Oral, resp. rate 18, SpO2 99 %. Physical Exam: Physical  Exam Constitutional:      Appearance: She is well-developed. She is not toxic-appearing or diaphoretic.  HENT:     Head: Normocephalic and atraumatic.  Eyes:     General: No scleral icterus.    Extraocular Movements: Extraocular movements intact.     Pupils: Pupils are equal, round, and reactive to light.  Cardiovascular:     Rate and Rhythm: Normal rate and regular rhythm.     Heart sounds: Normal heart sounds. No murmur.  Pulmonary:     Effort: Pulmonary effort is normal.     Breath sounds: Normal breath sounds. No wheezing.  Abdominal:     General: Abdomen is flat. Bowel sounds are normal. There is no distension.     Palpations: Abdomen is soft. There is no mass.     Tenderness: There is abdominal tenderness in the right lower quadrant. Positive signs include psoas sign.     Hernia: No hernia is present.  Skin:    General: Skin is warm and dry.  Neurological:     General: No focal deficit present.  Mental Status: She is alert and oriented to person, place, and time.  Psychiatric:        Behavior: Behavior normal.     Results for orders placed or performed during the hospital encounter of 01/08/19 (from the past 48 hour(s))  Urinalysis, Routine w reflex microscopic     Status: Abnormal   Collection Time: 01/08/19 12:43 PM  Result Value Ref Range   Color, Urine STRAW (A) YELLOW   APPearance CLEAR CLEAR   Specific Gravity, Urine 1.041 (H) 1.005 - 1.030   pH 6.0 5.0 - 8.0   Glucose, UA NEGATIVE NEGATIVE mg/dL   Hgb urine dipstick NEGATIVE NEGATIVE   Bilirubin Urine NEGATIVE NEGATIVE   Ketones, ur NEGATIVE NEGATIVE mg/dL   Protein, ur NEGATIVE NEGATIVE mg/dL   Nitrite NEGATIVE NEGATIVE   Leukocytes,Ua NEGATIVE NEGATIVE    Comment: Performed at [facility], [address] [place], [place] [address]  Lipase, blood     Status: None   Collection Time: 01/08/19 12:50 PM  Result Value Ref Range   Lipase 28 11 - 51 U/Sanders    Comment: Performed at [facility], [address] [place], [place] [address]  Comprehensive metabolic panel     Status: None   Collection Time: 01/08/19 12:50 PM  Result Value Ref Range   Sodium 137 135 - 145 mmol/Sanders   Potassium 3.7 3.5 - 5.1 mmol/Sanders   Chloride 104 98 - 111 mmol/Sanders   CO2 24 22 - 32 mmol/Sanders   Glucose, Bld 82 70 - 99 mg/dL   BUN 9 6 - 20 mg/dL   Creatinine, Ser 0.980.62 0.44 - 1.00 mg/dL   Calcium 9.0 8.9 - 11.910.3 mg/dL   Total Protein 7.9 6.5 - 8.1 g/dL   Albumin 4.4 3.5 - 5.0 g/dL   AST 16 15 - 41 U/Sanders   ALT 22 0 - 44 U/Sanders   Alkaline Phosphatase 70 38 - 126 U/Sanders   Total Bilirubin 0.7 0.3 - 1.2 mg/dL   GFR calc non Af Amer >60 >60 mL/min   GFR calc Af Amer >60 >60 mL/min   Anion gap 9 5 - 15    Comment: Performed at [facility], [address] [place], [place] [address]  CBC     Status: None   Collection Time: 01/08/19 12:50 PM  Result Value Ref Range   WBC 9.1 4.0 - 10.5 K/uL   RBC 4.80 3.87 - 5.11 MIL/uL   Hemoglobin 14.1 12.0 - 15.0 g/dL   HCT 95.642.9 21.336.0 - 08.646.0 %   MCV 89.4 80.0 - 100.0 fL   MCH 29.4 26.0 - 34.0 pg   MCHC 32.9 30.0 - 36.0 g/dL   RDW 57.813.2 46.911.5 - 62.915.5 %   Platelets 254 150 - 400 K/uL   nRBC 0.0 0.0 - 0.2 %    Comment: Performed at [facility], [address] [place], [place] [address]  hCG, quantitative, pregnancy     Status: None   Collection Time: 01/08/19 12:50 PM  Result Value Ref Range   hCG, Beta Chain, Quant, S <1 <5 mIU/mL    Comment:          GEST. AGE      CONC.  (mIU/mL)   <=1 WEEK        5 - 50     2 WEEKS       50 - 500     3 WEEKS  100 - 10,000     4 WEEKS     1,000 - 30,000     5 WEEKS     3,500 - 115,000   6-8 WEEKS     12,000 - 270,000    12 WEEKS     15,000 - 220,000        FEMALE AND NON-PREGNANT FEMALE:     LESS THAN 5 mIU/mL Performed at [facility], [address] [place], [place] [address]   Respiratory Panel by RT PCR (Flu A&B, Covid) - Nasopharyngeal Swab      Status: None   Collection Time: 01/08/19 12:52 PM   Specimen: Nasopharyngeal Swab  Result Value Ref Range   SARS Coronavirus 2 by RT PCR NEGATIVE NEGATIVE    Comment: (NOTE) SARS-CoV-2 target nucleic acids are NOT DETECTED. The SARS-CoV-2 RNA is generally detectable in upper respiratoy specimens during the acute phase of infection. The lowest concentration of SARS-CoV-2 viral copies this assay can detect is 131 copies/mL. A negative result does not preclude SARS-Cov-2 infection and should not be used as the sole basis for treatment or other patient management decisions. A negative result may occur with  improper specimen collection/handling, submission of specimen other than nasopharyngeal swab, presence of viral mutation(s) within the areas targeted by this assay, and inadequate number of viral copies (<131 copies/mL). A negative result must be combined with clinical observations, patient history, and epidemiological information. The expected result is Negative. Fact Sheet for Patients:  https://www.moore.com/ Fact Sheet for Healthcare Providers:  https://www.young.biz/ This test is not yet ap proved or cleared by the [place] FDA and  has been authorized for detection and/or diagnosis of SARS-CoV-2 by FDA under an Emergency Use Authorization (EUA). This EUA will remain  in effect (meaning this test can be used) for the duration of the COVID-19 declaration under Section 564(b)(1) of the Act, 21 U.S.C. section 360bbb-3(b)(1), unless the authorization is terminated or revoked sooner.    Influenza A by PCR NEGATIVE NEGATIVE   Influenza B by PCR NEGATIVE NEGATIVE    Comment: (NOTE) The Xpert Xpress SARS-CoV-2/FLU/RSV assay is intended as an aid in  the diagnosis of influenza from Nasopharyngeal swab specimens and  should not be used as a sole basis for treatment. Nasal washings and  aspirates are unacceptable for Xpert Xpress  SARS-CoV-2/FLU/RSV  testing. Fact Sheet for Patients: https://www.moore.com/ Fact Sheet for Healthcare Providers: https://www.young.biz/ This test is not yet approved or cleared by the [place] FDA and  has been authorized for detection and/or diagnosis of SARS-CoV-2 by  FDA under an Emergency Use Authorization (EUA). This EUA will remain  in effect (meaning this test can be used) for the duration of the  Covid-19 declaration under Section 564(b)(1) of the Act, 21  U.S.C. section 360bbb-3(b)(1), unless the authorization is  terminated or revoked. Performed at [facility], [address], [place], [place] [address]    CT Abdomen Pelvis W Contrast  Result Date: 01/08/2019 CLINICAL DATA:  Right lower quadrant pain and lower mid pelvic pain and tenderness for 4 days. Recent passage of kidney stone on 01/07/2019 EXAM: CT ABDOMEN AND PELVIS WITH CONTRAST TECHNIQUE: Multidetector CT imaging of the abdomen and pelvis was performed using the standard protocol following bolus administration of intravenous contrast. CONTRAST:  ISOVUE-300 IOPAMIDOL (ISOVUE-300) INJECTION 61% COMPARISON:  CT scans dated 07/24/2017 and 01/29/2017 FINDINGS: Lower chest: Normal. Hepatobiliary: 9 mm cyst in the anterior aspect of the dome of the right lobe  of the liver. Liver parenchyma is otherwise normal. Biliary tree is normal. Pancreas: Unremarkable. No pancreatic ductal dilatation or surrounding inflammatory changes. Spleen: Normal in size without focal abnormality. Adrenals/Urinary Tract: Adrenal glands are unremarkable. Kidneys are normal, without renal calculi, focal lesion, or hydronephrosis. Bladder is unremarkable. Stomach/Bowel: The appendix is diffusely thickened with subtle periappendiceal haziness, likely representing Alicia Sanders appendicitis. There is enhancement of the appendix. Diameter is 11 mm. The bowel otherwise appears normal. Vascular/Lymphatic:  No significant vascular findings are present. No enlarged abdominal or pelvic lymph nodes. Reproductive: IUD in place. Normal right ovary. 2.3 cm cyst on the left ovary. Other: No abdominal wall hernia or abnormality. No abdominopelvic ascites. Musculoskeletal: No acute or significant osseous findings. IMPRESSION: Alicia Sanders appendicitis. Electronically Signed   By: Francene Boyers M.D.   On: 01/08/2019 11:23    Assessment/Plan Acute appendicitis  Plan lap appendectomy this evening when OR time available  Begin IV abx's now  Rx for pain, nausea prn  I discussed the procedure with the patient at length.  We discussed laparoscopic technique, hospital stay, recovery time, and return to work.  OR is fairly busy at the moment, so it will be a few hours until she is able to go to the OR.  I will ask my partner, Dr. Marca Ancona, to see patient and perform procedure this evening.  Patient is agreeable to this plan.  The risks and benefits of the procedure have been discussed at length with the patient.  The patient understands the proposed procedure, potential alternative treatments, and the course of recovery to be expected.  All of the patient's questions have been answered at this time.  The patient wishes to proceed with surgery.  Darnell Level, MD [facility] Office: [phone]  01/08/2019, 4:48 PM Please see Amion for pager number during day hours 7:00am-4:30pm

## 2019-01-08 NOTE — Anesthesia Preprocedure Evaluation (Addendum)
Anesthesia Evaluation  Patient identified by MRN, date of birth, ID band Patient awake    Reviewed: Allergy & Precautions, NPO status , Patient's Chart, lab work & pertinent test results  Airway Mallampati: I  TM Distance: >3 FB Neck ROM: Full    Dental no notable dental hx. (+) Dental Advisory Given   Pulmonary neg pulmonary ROS,    Pulmonary exam normal        Cardiovascular negative cardio ROS Normal cardiovascular exam     Neuro/Psych negative neurological ROS     GI/Hepatic negative GI ROS, Neg liver ROS,   Endo/Other  Hypothyroidism   Renal/GU negative Renal ROS     Musculoskeletal  (+) Arthritis ,   Abdominal Normal abdominal exam  (+)   Peds  Hematology negative hematology ROS (+)   Anesthesia Other Findings   Reproductive/Obstetrics                            Anesthesia Physical Anesthesia Plan  ASA: II and emergent  Anesthesia Plan: General   Post-op Pain Management:    Induction: Intravenous, Rapid sequence and Cricoid pressure planned  PONV Risk Score and Plan: 4 or greater and Ondansetron, Dexamethasone, Midazolam and Scopolamine patch - Pre-op  Airway Management Planned: Oral ETT  Additional Equipment: None  Intra-op Plan:   Post-operative Plan: Extubation in OR  Informed Consent: I have reviewed the patients History and Physical, chart, labs and discussed the procedure including the risks, benefits and alternatives for the proposed anesthesia with the patient or authorized representative who has indicated his/her understanding and acceptance.     Dental advisory given  Plan Discussed with: CRNA  Anesthesia Plan Comments: (COVID-19 Labs  No results for input(s): DDIMER, FERRITIN, LDH, CRP in the last 72 hours.  Lab Results      Component                Value               Date                      SARSCOV2NAA              NEGATIVE            01/08/2019                 SARSCOV2NAA              Not Detected        11/07/2018            )       Anesthesia Quick Evaluation

## 2019-01-08 NOTE — ED Notes (Signed)
ED Provider at bedside. 

## 2019-01-08 NOTE — ED Provider Notes (Signed)
[facility]-EMERGENCY DEPT Provider Note   CSN: [id number] Arrival date & time: 01/08/19  1231     History Chief Complaint  Patient presents with  . Abdominal Pain    Alicia Sanders is a 39 y.o. female.  The history is provided by the patient.  Abdominal Pain Pain location:  RLQ Pain quality: aching   Pain radiates to:  Does not radiate Pain severity:  Moderate Onset quality:  Gradual Duration:  4 days Timing:  Constant Progression:  Worsening Chronicity:  New Context comment:  Outpatient CT scan showed appendicitis and instructed to come for evaluation.  Relieved by:  Nothing Worsened by:  Movement, palpation and position changes Associated symptoms: nausea   Associated symptoms: no anorexia, no chest pain, no chills, no cough, no dysuria, no fever, no hematuria, no shortness of breath, no sore throat, no vaginal bleeding, no vaginal discharge and no vomiting   Risk factors: has not had multiple surgeries and not pregnant        Past Medical History:  Diagnosis Date  . Back problem   . Bilateral lower abdominal discomfort 03/05/2018  . Bloating 03/05/2018  . Change in bowel habits 09/23/2017  . Cough 01/23/2017  . Eczema   . Hypothyroidism   . No energy 03/05/2018  . Pleuritic chest pain 01/27/2016  . Preventive measure 05/08/2012  . Weight gain 04/18/2015    Patient Active Problem List   Diagnosis Date Noted  . Thyroid disease 01/07/2019  . Hirsutism 03/05/2018  . Abnormal CT of the abdomen 09/23/2017  . Nausea 09/23/2017  . 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 Acute abdominal pain in right lower quadrant 01/28/2017  . Other viral warts 06/26/2016  . Overweight (BMI 25.0-29.9) 06/25/2016  . Dysphagia 01/27/2016  . Mood disorder (HCC) 01/27/2016  . DDD (degenerative disc disease), cervical 01/27/2016  . 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 DDD (degenerative disc disease), lumbar 05/08/2012  . Hypothyroidism 07/11/2010  . BREAST CYST 01/19/2009    . Allergic rhinitis 11/26/2007    Past Surgical History:  Procedure Laterality Date  . HAND SURGERY Right   . INTRAUTERINE DEVICE INSERTION       OB History   No obstetric history on file.     Family History  Problem Relation Age of Onset  . Heart disease Father   . Heart disease Brother   . Diabetes Maternal Grandmother   . Lung cancer Maternal Grandfather   . Heart disease Paternal Grandfather   . Endometrial cancer Mother   . Colon cancer Neg Hx   . Esophageal cancer Neg Hx   . Inflammatory bowel disease Neg Hx   . Liver disease Neg Hx   . Pancreatic cancer Neg Hx   . Rectal cancer Neg Hx   . Stomach cancer Neg Hx     Social History   Tobacco Use  . Smoking status: Never Smoker  . Smokeless tobacco: Never Used  Substance Use Topics  . Alcohol use: Yes    Comment: occasional  . Drug use: Never    Home Medications Prior to Admission medications   Medication Sig Start Date End Date Taking? Authorizing Provider  albuterol (PROVENTIL HFA;VENTOLIN HFA) 108 (90 Base) MCG/ACT inhaler 2 puffs 15 minutes before physical activity and exercise 02/11/17   Monica Becton, MD  buPROPion (WELLBUTRIN XL) 300 MG 24 hr tablet PLEASE SEE ATTACHED FOR DETAILED DIRECTIONS 03/11/18   [name], PA-C  cholecalciferol (VITAMIN D3) 25 MCG (1000 UT) tablet Take 1,000  Units by mouth daily.    [provider]  clobetasol cream (TEMOVATE) 0.05 % Apply 1 application topically 2 (two) times daily. 09/28/15   Monica Becton, MD  clonazePAM (KLONOPIN) 0.5 MG tablet Take one tablet as needed for acute anxiety. Patient not taking: Reported on 01/06/2019 03/19/18   [name], PA-C  Crisaborole (EUCRISA) 2 % OINT Apply 1 application topically 2 (two) times daily. 02/25/18   Breeback, Lonna Cobb, PA-C  levothyroxine (SYNTHROID) 50 MCG tablet TAKE 1 TABLET BY MOUTH EVERY DAY 10/31/18   Breeback, [name] L, PA-C  methylPREDNISolone (MEDROL DOSEPAK) 4 MG TBPK tablet Take as  directed by package insert. Patient not taking: Reported on 01/06/2019 11/07/18   Tandy Gaw L, PA-C  montelukast (SINGULAIR) 10 MG tablet TAKE 1 TABLET BY MOUTH EVERYDAY AT BEDTIME 05/29/18   Breeback, [name] L, PA-C  naphazoline-pheniramine (NAPHCON-A) 0.025-0.3 % ophthalmic solution Place 2 drops into both eyes 2 (two) times daily as needed for eye irritation. Patient not taking: Reported on 01/06/2019 04/14/18   Demetrio Lapping, PA-C  ondansetron (ZOFRAN-ODT) 8 MG disintegrating tablet Take 1 tablet (8 mg total) by mouth every 8 (eight) hours as needed for nausea. 01/06/19   Tollie Eth, NP  oxyCODONE-acetaminophen (PERCOCET/ROXICET) 5-325 MG tablet Take 1-2 tablets by mouth every 6 (six) hours as needed for up to 3 days for severe pain. 01/06/19 01/09/19  Tollie Eth, NP  spironolactone (ALDACTONE) 100 MG tablet Take 100 mg by mouth daily. with food 09/02/18   [provider]  traMADol (ULTRAM) 50 MG tablet Take 1 tablet (50 mg total) by mouth 3 (three) times daily as needed. 10/21/18   Monica Becton, MD    Allergies    Thimerosal  Review of Systems   Review of Systems  Constitutional: Negative for chills and fever.  HENT: Negative for ear pain and sore throat.   Eyes: Negative for pain and visual disturbance.  Respiratory: Negative for cough and shortness of breath.   Cardiovascular: Negative for chest pain and palpitations.  Gastrointestinal: Positive for abdominal pain and nausea. Negative for anorexia and vomiting.  Genitourinary: Negative for dysuria, hematuria, vaginal bleeding and vaginal discharge.  Musculoskeletal: Negative for arthralgias and back pain.  Skin: Negative for color change and rash.  Neurological: Negative for seizures and syncope.  All other systems reviewed and are negative.   Physical Exam Updated Vital Signs  ED Triage Vitals  Enc Vitals Group     BP 01/08/19 1240 (!) 154/102     Pulse Rate 01/08/19 1240 92     Resp 01/08/19 1240 18      Temp 01/08/19 1240 98.5 F (36.9 C)     Temp Source 01/08/19 1240 Oral     SpO2 01/08/19 1240 99 %     Weight --      Height --      Head Circumference --      Peak Flow --      Pain Score 01/08/19 1241 7     Pain Loc --      Pain Edu? --      Excl. in GC? --     Physical Exam Vitals and nursing note reviewed.  Constitutional:      General: She is not in acute distress.    Appearance: She is well-developed. She is not ill-appearing.  HENT:     Head: Normocephalic and atraumatic.  Eyes:     Conjunctiva/sclera: Conjunctivae normal.  Cardiovascular:  Rate and Rhythm: Normal rate and regular rhythm.     Heart sounds: No murmur.  Pulmonary:     Effort: Pulmonary effort is normal. No respiratory distress.     Breath sounds: Normal breath sounds.  Abdominal:     General: Abdomen is flat. Bowel sounds are normal. There is no distension.     Palpations: Abdomen is soft.     Tenderness: There is abdominal tenderness in the right lower quadrant. There is guarding.  Musculoskeletal:     Cervical back: Neck supple.  Skin:    General: Skin is warm and dry.     Capillary Refill: Capillary refill takes less than 2 seconds.  Neurological:     General: No focal deficit present.     Mental Status: She is alert.  Psychiatric:        Mood and Affect: Mood normal.     ED Results / Procedures / Treatments   Labs (all labs ordered are listed, but only abnormal results are displayed) Labs Reviewed  RESPIRATORY PANEL BY RT PCR (FLU A&B, COVID)  CBC  LIPASE, BLOOD  COMPREHENSIVE METABOLIC PANEL  URINALYSIS, ROUTINE W REFLEX MICROSCOPIC  HCG, QUANTITATIVE, PREGNANCY    EKG None  Radiology CT Abdomen Pelvis W Contrast  Result Date: 01/08/2019 CLINICAL DATA:  Right lower quadrant pain and lower mid pelvic pain and tenderness for 4 days. Recent passage of kidney stone on 01/07/2019 EXAM: CT ABDOMEN AND PELVIS WITH CONTRAST TECHNIQUE: Multidetector CT imaging of the abdomen and  pelvis was performed using the standard protocol following bolus administration of intravenous contrast. CONTRAST:  ISOVUE-300 IOPAMIDOL (ISOVUE-300) INJECTION 61% COMPARISON:  CT scans dated 07/24/2017 and 01/29/2017 FINDINGS: Lower chest: Normal. Hepatobiliary: 9 mm cyst in the anterior aspect of the dome of the right lobe of the liver. Liver parenchyma is otherwise normal. Biliary tree is normal. Pancreas: Unremarkable. No pancreatic ductal dilatation or surrounding inflammatory changes. Spleen: Normal in size without focal abnormality. Adrenals/Urinary Tract: Adrenal glands are unremarkable. Kidneys are normal, without renal calculi, focal lesion, or hydronephrosis. Bladder is unremarkable. Stomach/Bowel: The appendix is diffusely thickened with subtle periappendiceal haziness, likely representing early appendicitis. There is enhancement of the appendix. Diameter is 11 mm. The bowel otherwise appears normal. Vascular/Lymphatic: No significant vascular findings are present. No enlarged abdominal or pelvic lymph nodes. Reproductive: IUD in place. Normal right ovary. 2.3 cm cyst on the left ovary. Other: No abdominal wall hernia or abnormality. No abdominopelvic ascites. Musculoskeletal: No acute or significant osseous findings. IMPRESSION: Early appendicitis. Electronically Signed   By: Francene Boyers M.D.   On: 01/08/2019 11:23    Procedures Procedures (including critical care time)  Medications Ordered in ED Medications  sodium chloride flush (NS) 0.9 % injection 3 mL (has no administration in time range)  morphine 4 MG/ML injection 4 mg (has no administration in time range)  ondansetron (ZOFRAN) injection 4 mg (has no administration in time range)  lactated ringers infusion (has no administration in time range)    ED Course  I have reviewed the triage vital signs and the nursing notes.  Pertinent labs & imaging results that were available during my care of the patient were reviewed by me  and considered in my medical decision making (see chart for details).    MDM Rules/Calculators/A&P  Alicia Sanders is a 39 year old female with history of hypothyroidism who presents to the ED with appendicitis found on outpatient CT scan.  Patient with right lower abdominal pain for the last  several days with nausea.  Pain has intensified.  Worse with movement and palpation.  CT scan showed findings consistent with early appendicitis.  She has right lower abdominal pain on exam.  Otherwise she does not have fever or significant leukocytosis or anemia or electrolyte abnormality.  No AKI.  Will get Covid test for surgical clearance.  Pregnancy test has been ordered as well.  Pregnancy test was negative 2 days ago.  Will consult surgery and anticipate admission for appendectomy.  This chart was dictated using voice recognition software.  Despite best efforts to proofread,  errors can occur which can change the documentation meaning.    Final Clinical Impression(s) / ED Diagnoses Final diagnoses:  Acute appendicitis, unspecified acute appendicitis type    Rx / DC Orders ED Discharge Orders    None       Lennice Sites, DO 01/08/19 1338

## 2019-01-08 NOTE — Op Note (Signed)
Appendectomy, Laparoscopic, Procedure Note  Indications: The patient presented with a history of right-sided abdominal pain. A CT revealed findings consistent with acute appendicitis.  Patient was seen examined and chart and record reviewed.  We discussed treatment options of laparoscopic appendectomy versus medical management.  Risk, benefits and long-term expectation of each met.  She opted for laparoscopic appendectomy.The procedure has been discussed with the patient.  Alternative therapies have been discussed with the patient.  Operative risks include bleeding,  Infection,  Organ injury,  Nerve injury,  Blood vessel injury,  DVT,  Pulmonary embolism,  Death,  And possible reoperation.  Medical management risks include worsening of present situation.  The success of the procedure is 50 -90 % at treating patients symptoms.  The patient understands and agrees to proceed.  Pre-operative Diagnosis: Acute appendicitis without mention of peritonitis  Post-operative Diagnosis: Same  Surgeon: Turner Daniels MD   Assistants: OR staff  Anesthesia: General endotracheal anesthesia and Local anesthesia 0.25.% bupivacaine  ASA Class: 2  Procedure Details  The patient was seen again in the Holding Room. The risks, benefits, complications, treatment options, and expected outcomes were discussed with the patient and/or family. The possibilities of reaction to medication, pulmonary aspiration, perforation of viscus, bleeding, recurrent infection, finding a normal appendix, the need for additional procedures, failure to diagnose a condition, and creating a complication requiring transfusion or operation were discussed. There was concurrence with the proposed plan and informed consent was obtained. The site of surgery was properly noted/marked. The patient was taken to Operating Room, identified as Alicia Sanders and the procedure verified as Appendectomy. A Time Out was held and the above information  confirmed.  The patient was placed in the supine position and general anesthesia was induced, along with placement of orogastric tube, Venodyne boots, and a Foley catheter. The abdomen was prepped and draped in a sterile fashion. A one centimeter infraumbilical incision was made and the peritoneal cavity was accessed using the OPEN  technique. The pneumoperitoneum was then established to steady pressure of 12 mmHg. A 12 mm port was placed through the umbilical incision. Additional 5 mm cannulas then placed in the left lower quadrant of the abdomen and half way between the umbilicus in xiphoid process in the right upper outer quadrant Under direct vision. A careful evaluation of the entire abdomen was carried out. The patient was placed in Trendelenburg and left lateral decubitus position. The small intestines were retracted in the cephalad and left lateral direction away from the pelvis and right lower quadrant. The patient was found to have an enlarged and inflamed appendix that was extending into the pelvis. There was no evidence of perforation.  The appendix was carefully dissected. A window was made in the mesoappendix at the base of the appendix. A harmonic scalpel was used across the mesoappendix. The appendix was divided at its base using an endo-GIA stapler. Minimal appendiceal stump was left in place. There was no evidence of bleeding, leakage, or complication after division of the appendix. Irrigation was also performed and irrigate suctioned from the abdomen as well.  The umbilical port site was closed using 0 vicryl pursestring sutures fashion at the level of the fascia. The trocar site skin wounds were closed using skin staples.  Instrument, sponge, and needle counts were correct at the conclusion of the case.   Findings: The appendix was found to be inflamed. There were not signs of necrosis.  There was not perforation. There was not abscess formation.  Estimated Blood Loss:  less than 50  mL         Drains: None         Total IV Fluids: Per anesthesia record         Specimens: Appendix         Complications:  None; patient tolerated the procedure well.         Disposition: PACU - hemodynamically stable.         Condition: stable

## 2019-01-08 NOTE — Anesthesia Postprocedure Evaluation (Signed)
Anesthesia Post Note  Patient: Alicia Sanders  Procedure(s) Performed: APPENDECTOMY LAPAROSCOPIC (N/A )     Patient location during evaluation: Phase II Anesthesia Type: General Level of consciousness: awake and alert Pain management: pain level controlled Vital Signs Assessment: post-procedure vital signs reviewed and stable Respiratory status: spontaneous breathing, nonlabored ventilation, respiratory function stable and patient connected to nasal cannula oxygen Cardiovascular status: blood pressure returned to baseline and stable Postop Assessment: no apparent nausea or vomiting Anesthetic complications: no    Last Vitals:  Vitals:   01/08/19 1945 01/08/19 2000  BP: 134/86 129/83  Pulse: 99 92  Resp: 14 10  Temp:    SpO2: 100% 100%    Last Pain:  Vitals:   01/08/19 1945  TempSrc:   PainSc: 1                  Shelton Silvas

## 2019-01-08 NOTE — ED Triage Notes (Signed)
Pt had CT today showing appendicitis. Pt was instructed to come here for eval. Last solid food at 0800. Last liquids at 1230, instructed not to drink any more .

## 2019-01-08 NOTE — Transfer of Care (Signed)
Immediate Anesthesia Transfer of Care Note  Patient: Alicia Sanders  Procedure(s) Performed: APPENDECTOMY LAPAROSCOPIC (N/A )  Patient Location: PACU  Anesthesia Type:General  Level of Consciousness: awake, alert , oriented and patient cooperative  Airway & Oxygen Therapy: Patient Spontanous Breathing and Patient connected to face mask oxygen  Post-op Assessment: Report given to RN and Post -op Vital signs reviewed and stable  Post vital signs: Reviewed and stable  Last Vitals:  Vitals Value Taken Time  BP    Temp    Pulse    Resp    SpO2      Last Pain:  Vitals:   01/08/19 1808  TempSrc: Oral  PainSc:          Complications: No apparent anesthesia complications

## 2019-01-08 NOTE — Progress Notes (Signed)
Pt has been called and notified of CT results showing appendicitis without rupture. She was instructed to go to the ED for evaluation. She acknowledged understanding. Will follow along.

## 2019-01-09 ENCOUNTER — Encounter: Payer: Self-pay | Admitting: Nurse Practitioner

## 2019-01-09 NOTE — Discharge Summary (Signed)
Physician Discharge Summary  Patient ID: AEVA POSEY MRN: [id number] DOB/AGE: Jan 12, 1980 39 y.o.  Admit date: 01/08/2019 Discharge date: 01/09/2019  Admission Diagnoses:  Discharge Diagnoses:  Principal Problem:   Appendicitis, acute Active Problems:   Acute appendicitis   Discharged Condition: good  Hospital Course: Patient underwent laparoscopic appendectomy and was discharged from the PACU.  She recovered well.  She had no immediate complications.      Treatments: surgery: Laparoscopic appendectomy  Discharge Exam: Blood pressure 134/89, pulse 92, temperature 98.7 F (37.1 C), resp. rate 16, height 5\' 5"  (1.651 m), weight 77.1 kg, SpO2 99 %. General appearance: alert and cooperative Resp: clear to auscultation bilaterally GI: Incisions clean dry intact  Disposition: Discharge disposition: 01-Home or Self Care       Discharge Instructions    Diet - low sodium heart healthy   Complete by: As directed    Increase activity slowly   Complete by: As directed      Allergies as of 01/08/2019      Reactions   Thimerosal Swelling, Other (See Comments)   Blisters and swelling at injection site; significant malaise.  CAN tolerate preservative-free flu vaccine (10/24/16)   Adhesive [tape] Other (See Comments)   Blister and redness/ longer tears skin      Medication List    TAKE these medications   albuterol 108 (90 Base) MCG/ACT inhaler Commonly known as: VENTOLIN HFA 2 puffs 15 minutes before physical activity and exercise   buPROPion 300 MG 24 hr tablet Commonly known as: WELLBUTRIN XL PLEASE SEE ATTACHED FOR DETAILED DIRECTIONS What changed:   how much to take  how to take this  when to take this  additional instructions   clobetasol cream 0.05 % Commonly known as: TEMOVATE Apply 1 application topically 2 (two) times daily. What changed:   when to take this  reasons to take this   clonazePAM 0.5 MG tablet Commonly known as: KLONOPIN Take  one tablet as needed for acute anxiety.   Crisaborole 2 % Oint Commonly known as: 10/26/16 Apply 1 application topically 2 (two) times daily. What changed:   when to take this  reasons to take this   levothyroxine 50 MCG tablet Commonly known as: SYNTHROID TAKE 1 TABLET BY MOUTH EVERY DAY What changed: when to take this   methylPREDNISolone 4 MG Tbpk tablet Commonly known as: MEDROL DOSEPAK Take as directed by package insert.   montelukast 10 MG tablet Commonly known as: SINGULAIR TAKE 1 TABLET BY MOUTH EVERYDAY AT BEDTIME What changed: See the new instructions.   naphazoline-pheniramine 0.025-0.3 % ophthalmic solution Commonly known as: NAPHCON-A Place 2 drops into both eyes 2 (two) times daily as needed for eye irritation.   ondansetron 8 MG disintegrating tablet Commonly known as: ZOFRAN-ODT Take 1 tablet (8 mg total) by mouth every 8 (eight) hours as needed for nausea.   oxyCODONE 5 MG immediate release tablet Commonly known as: Oxy IR/ROXICODONE Take 1 tablet (5 mg total) by mouth every 6 (six) hours as needed for moderate pain.   oxyCODONE-acetaminophen 5-325 MG tablet Commonly known as: PERCOCET/ROXICET Take 1-2 tablets by mouth every 6 (six) hours as needed for up to 3 days for severe pain.   traMADol 50 MG tablet Commonly known as: ULTRAM Take 1 tablet (50 mg total) by mouth 3 (three) times daily as needed. What changed: reasons to take this   tretinoin 0.05 % cream Commonly known as: RETIN-A Apply 1 application topically daily as needed (ezma).  Signed: Joyice Faster Acacia Latorre 01/09/2019, 5:45 AM

## 2019-01-12 LAB — SURGICAL PATHOLOGY

## 2019-02-04 ENCOUNTER — Encounter: Payer: Self-pay | Admitting: Nurse Practitioner

## 2019-02-12 ENCOUNTER — Ambulatory Visit: Payer: Managed Care, Other (non HMO) | Attending: Internal Medicine

## 2019-02-12 DIAGNOSIS — Z23 Encounter for immunization: Secondary | ICD-10-CM | POA: Insufficient documentation

## 2019-02-12 NOTE — Progress Notes (Signed)
   Covid-19 Vaccination Clinic  Name:  SAFIYYAH VASCONEZ    MRN: [id number] DOB: 1980/09/11  02/12/2019  Ms. Paone was observed post Covid-19 immunization for 15 minutes without incidence. She was provided with Vaccine Information Sheet and instruction to access the V-Safe system.   Ms. Flaugher was instructed to call 911 with any severe reactions post vaccine: Marland Kitchen Difficulty breathing  . Swelling of your face and throat  . A fast heartbeat  . A bad rash all over your body  . Dizziness and weakness    Immunizations Administered    Name Date Dose VIS Date Route   Pfizer COVID-19 Vaccine 02/12/2019 11:07 AM 0.3 mL 12/12/2018 Intramuscular   Manufacturer: [employer], [employer]   Lot: [id number]   NDC: [address]-6

## 2019-02-13 ENCOUNTER — Ambulatory Visit: Payer: Managed Care, Other (non HMO)

## 2019-03-02 ENCOUNTER — Other Ambulatory Visit: Payer: Self-pay | Admitting: Physician Assistant

## 2019-03-02 DIAGNOSIS — F39 Unspecified mood [affective] disorder: Secondary | ICD-10-CM

## 2019-03-09 ENCOUNTER — Ambulatory Visit: Payer: Managed Care, Other (non HMO) | Attending: Internal Medicine

## 2019-03-09 DIAGNOSIS — Z23 Encounter for immunization: Secondary | ICD-10-CM | POA: Insufficient documentation

## 2019-03-09 NOTE — Progress Notes (Signed)
   Covid-19 Vaccination Clinic  Name:  Alicia Sanders    MRN: [id number] DOB: [birth year]  03/09/2019  [name] was observed post Covid-19 immunization for 15 minutes without incident. She was provided with Vaccine Information Sheet and instruction to access the V-Safe system.   Ms. Backs was instructed to call 911 with any severe reactions post vaccine: Marland Kitchen Difficulty breathing  . Swelling of face and throat  . A fast heartbeat  . A bad rash all over body  . Dizziness and weakness   Immunizations Administered    Name Date Dose VIS Date Route   Pfizer COVID-19 Vaccine 03/09/2019  8:28 AM 0.3 mL 12/12/2018 Intramuscular   Manufacturer: [employer], [employer]   Lot: [id number]   NDC: [address]-5

## 2019-04-01 ENCOUNTER — Other Ambulatory Visit: Payer: Self-pay

## 2019-04-01 DIAGNOSIS — M5030 Other cervical disc degeneration, unspecified cervical region: Secondary | ICD-10-CM

## 2019-04-01 MED ORDER — TRAMADOL HCL 50 MG PO TABS: 50.0000 mg | ORAL_TABLET | Freq: Three times a day (TID) | ORAL | 0 refills | Status: DC | PRN

## 2019-04-01 NOTE — Telephone Encounter (Signed)
Last written 10/21/2018 #270 with no refills Last seen 10/21/2018

## 2019-04-20 ENCOUNTER — Encounter: Payer: Self-pay | Admitting: Physician Assistant

## 2019-04-20 NOTE — Telephone Encounter (Signed)
Routing to provider  

## 2019-05-02 ENCOUNTER — Other Ambulatory Visit: Payer: Self-pay | Admitting: Physician Assistant

## 2019-05-31 ENCOUNTER — Other Ambulatory Visit: Payer: Self-pay | Admitting: Physician Assistant

## 2019-06-02 NOTE — Telephone Encounter (Signed)
It looks like she hasn't had a TSH since 02/2018.

## 2019-06-04 ENCOUNTER — Other Ambulatory Visit: Payer: Self-pay | Admitting: Physician Assistant

## 2019-06-04 DIAGNOSIS — F39 Unspecified mood [affective] disorder: Secondary | ICD-10-CM

## 2019-06-24 ENCOUNTER — Encounter: Payer: Self-pay | Admitting: Physician Assistant

## 2019-06-24 MED ORDER — LEVOTHYROXINE SODIUM 50 MCG PO TABS: 50.0000 ug | ORAL_TABLET | Freq: Every day | ORAL | 0 refills | Status: DC

## 2019-06-29 ENCOUNTER — Other Ambulatory Visit: Payer: Self-pay

## 2019-06-29 ENCOUNTER — Telehealth (INDEPENDENT_AMBULATORY_CARE_PROVIDER_SITE_OTHER): Payer: Managed Care, Other (non HMO) | Admitting: Physician Assistant

## 2019-06-29 VITALS — BP 123/73 | HR 87 | Ht 65.0 in | Wt 170.0 lb

## 2019-06-29 DIAGNOSIS — E039 Hypothyroidism, unspecified: Secondary | ICD-10-CM | POA: Diagnosis not present

## 2019-06-29 DIAGNOSIS — Z13220 Encounter for screening for lipoid disorders: Secondary | ICD-10-CM

## 2019-06-29 DIAGNOSIS — Z79899 Other long term (current) drug therapy: Secondary | ICD-10-CM

## 2019-06-29 DIAGNOSIS — R0683 Snoring: Secondary | ICD-10-CM

## 2019-06-29 DIAGNOSIS — Z1159 Encounter for screening for other viral diseases: Secondary | ICD-10-CM

## 2019-06-29 DIAGNOSIS — Z131 Encounter for screening for diabetes mellitus: Secondary | ICD-10-CM

## 2019-06-29 DIAGNOSIS — F39 Unspecified mood [affective] disorder: Secondary | ICD-10-CM

## 2019-06-29 DIAGNOSIS — E559 Vitamin D deficiency, unspecified: Secondary | ICD-10-CM

## 2019-06-29 DIAGNOSIS — E663 Overweight: Secondary | ICD-10-CM

## 2019-06-29 DIAGNOSIS — G478 Other sleep disorders: Secondary | ICD-10-CM

## 2019-06-29 DIAGNOSIS — M5030 Other cervical disc degeneration, unspecified cervical region: Secondary | ICD-10-CM

## 2019-06-29 MED ORDER — TRAMADOL HCL 50 MG PO TABS: 50.0000 mg | ORAL_TABLET | Freq: Three times a day (TID) | ORAL | 0 refills | Status: DC | PRN

## 2019-06-29 MED ORDER — BUPROPION HCL ER (XL) 300 MG PO TB24: ORAL_TABLET | ORAL | 3 refills | Status: DC

## 2019-06-29 NOTE — Progress Notes (Signed)
Patient ID: Alicia Sanders, female   DOB: 10/06/1980, 39 y.o.   MRN: [id number] .Marland KitchenVirtual Visit via Telephone Note  I connected with Alicia Sanders on 06/29/19 at  8:10 AM EDT by telephone and verified that I am speaking with the correct person using two identifiers.  Location: Patient: in car Provider: clinic   I discussed the limitations, risks, security and privacy concerns of performing an evaluation and management service by telephone and the availability of in person appointments. I also discussed with the patient that there may be a patient responsible charge related to this service. The patient expressed understanding and agreed to proceed.   History of Present Illness: Patient is a 39 year old female who calls into the clinic for medication refills and to discuss snoring again.  She has past medical history of hypothyroidism and mood disorder.  Overall she feels well controlled with mood. She is struggling with some lack of energy.  She thinks it could be coming from the snoring.  She also wants her thyroid checked.  For her snoring the Breathe Right strips seem to help but she had a new adhesive reaction on her nose and pulled her skin off.  She uses Flonase twice a day and Singulair daily.  She constantly feels congested.  Her snoring is waking her and her husband up at night.  She has not been allergy tested.   .. Active Ambulatory Problems    Diagnosis Date Noted  . Allergic rhinitis 11/26/2007  . BREAST CYST 01/19/2009  . Hypothyroidism 07/11/2010  . DDD (degenerative disc disease), lumbar 05/08/2012  . 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 Dysphagia 01/27/2016  . Mood disorder (HCC) 01/27/2016  . DDD (degenerative disc disease), cervical 01/27/2016  . Overweight (BMI 25.0-29.9) 06/25/2016  . Other viral warts 06/26/2016  . Acute abdominal pain in right lower quadrant 01/28/2017  . 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 Abnormal CT of the abdomen 09/23/2017  . Nausea  09/23/2017  . Hirsutism 03/05/2018  . Thyroid disease 01/07/2019  . Appendicitis, acute 01/08/2019  . Acute appendicitis 01/08/2019  . Snoring 06/29/2019   Resolved Ambulatory Problems    Diagnosis Date Noted  . Dermatophytosis of nail 09/04/2007  . ANXIETY STATE NOS 10/22/2006  . OTITIS MEDIA, SEROUS, ACUTE, RIGHT 12/03/2009  . HOT FLASHES 10/16/2006  . DERMATITIS 09/07/2009  . SKIN RASH 09/04/2007  . SYMPTOM, ENLARGEMENT, LYMPH NODES 10/16/2006  . OTITIS EXTERNA 07/11/2010  . LOW BACK PAIN, ACUTE 08/03/2010  . BACK STRAIN, THORACIC 08/03/2010  . Acute maxillary sinusitis 05/08/2012  . Preventive measure 05/08/2012  . Dysfunction of right eustachian tube 06/03/2012  . Trochanteric bursitis of left hip 08/28/2012  . Plantar fasciitis, right 04/18/2015  . Weight gain 04/18/2015  . Acute maxillary sinusitis 10/26/2015  . Pleuritic chest pain 01/27/2016  . Acute cystitis 10/11/2016  . Cough 01/23/2017  . Change in bowel habits 09/23/2017  . Bloating 03/05/2018  . Bilateral lower abdominal discomfort 03/05/2018  . No energy 03/05/2018   Past Medical History:  Diagnosis Date  . Back problem   . Eczema    Reviewed med, allergy, problem list.     Observations/Objective: No acute distress  .Marland Kitchen Today's Vitals   06/29/19 0808  BP: 123/73  Pulse: 87  Weight: 170 lb (77.1 kg)  Height: 5\' 5"  (1.651 m)   Body mass index is 28.29 kg/m.   Assessment and Plan: Marland KitchenOree was seen today for thyroid problem.  Diagnoses and all orders for this visit:  Snoring  Mood  disorder (HCC) -     buPROPion (WELLBUTRIN XL) 300 MG 24 hr tablet; TAKE 1 TABLET (300 MG TOTAL) BY MOUTH DAILY.  Hypothyroidism, unspecified type -     TSH  Overweight (BMI 25.0-29.9)  Screening for lipid disorders -     Lipid Panel w/reflex Direct LDL  Medication management -     COMPLETE METABOLIC PANEL WITH GFR  Vitamin D insufficiency -     VITAMIN D 25 Hydroxy (Vit-D Deficiency,  Fractures)  Screening for diabetes mellitus -     COMPLETE METABOLIC PANEL WITH GFR  Encounter for hepatitis C screening test for low risk patient -     Hepatitis C Antibody   Needs labs fasting for medication management.   Snoring:failed breathe right strips. Overweight but not obese. Using singulair/flonase daily. She continues to feel congested all the time. Add zyrtec at bedtime. Will make referral to sleep study and ENT for evaluation. Consider allergy testing in future.    Follow Up Instructions:    I discussed the assessment and treatment plan with the patient. The patient was provided an opportunity to ask questions and all were answered. The patient agreed with the plan and demonstrated an understanding of the instructions.   The patient was advised to call back or seek an in-person evaluation if the symptoms worsen or if the condition fails to improve as anticipated.  I provided 15 minutes of non-face-to-face time during this encounter.   Iran Planas, PA-C

## 2019-06-29 NOTE — Progress Notes (Signed)
Patient having issues with snoring, has tried some things suggested in the past but aren't helping  Also time to get her thyroid checked.

## 2019-07-01 LAB — COMPLETE METABOLIC PANEL WITH GFR
AG Ratio: 1.6 (calc) (ref 1.0–2.5)
ALT: 14 U/L (ref 6–29)
AST: 11 U/L (ref 10–30)
Albumin: 4.8 g/dL (ref 3.6–5.1)
Alkaline phosphatase (APISO): 97 U/L (ref 31–125)
BUN: 10 mg/dL (ref 7–25)
CO2: 28 mmol/L (ref 20–32)
Calcium: 9.8 mg/dL (ref 8.6–10.2)
Chloride: 104 mmol/L (ref 98–110)
Creat: 0.85 mg/dL (ref 0.50–1.10)
GFR, Est African American: 101 mL/min/{1.73_m2} (ref >=60)
GFR, Est Non African American: 87 mL/min/{1.73_m2} (ref >=60)
Globulin: 3 g/dL (calc) (ref 1.9–3.7)
Glucose, Bld: 86 mg/dL (ref 65–99)
Potassium: 4.2 mmol/L (ref 3.5–5.3)
Sodium: 139 mmol/L (ref 135–146)
Total Bilirubin: 0.7 mg/dL (ref 0.2–1.2)
Total Protein: 7.8 g/dL (ref 6.1–8.1)

## 2019-07-01 LAB — LIPID PANEL W/REFLEX DIRECT LDL
Cholesterol: 155 mg/dL (ref <200)
HDL: 41 mg/dL — ABNORMAL LOW (ref >=50)
LDL Cholesterol (Calc): 99 mg/dL (calc)
Non-HDL Cholesterol (Calc): 114 mg/dL (calc) (ref <130)
Total CHOL/HDL Ratio: 3.8 (calc) (ref <5.0)
Triglycerides: 68 mg/dL (ref <150)

## 2019-07-01 LAB — VITAMIN D 25 HYDROXY (VIT D DEFICIENCY, FRACTURES): Vit D, 25-Hydroxy: 18 ng/mL — ABNORMAL LOW (ref 30–100)

## 2019-07-01 LAB — TSH: TSH: 1.45 mIU/L

## 2019-07-02 ENCOUNTER — Other Ambulatory Visit: Payer: Self-pay | Admitting: Physician Assistant

## 2019-07-02 DIAGNOSIS — E559 Vitamin D deficiency, unspecified: Secondary | ICD-10-CM

## 2019-07-02 LAB — HEPATITIS C ANTIBODY
Hepatitis C Ab: NONREACTIVE
SIGNAL TO CUT-OFF: 0.01 (ref <1.00)

## 2019-07-02 MED ORDER — VITAMIN D (ERGOCALCIFEROL) 1.25 MG (50000 UNIT) PO CAPS
50000.0000 [IU] | ORAL_CAPSULE | ORAL | 0 refills | Status: DC
Start: 2019-07-02 — End: 2019-09-16

## 2019-07-02 NOTE — Progress Notes (Signed)
Alicia Sanders,   HDL(good cholesterol) a little low. Exercise can help to increase this number.  LDL to goal.  Kidney, liver, glucose look great.  Thyroid normal.  Vitamin d LOW.  Lets send you high dose vitamin D for 3 months and then recheck. Take only once a week.

## 2019-07-09 ENCOUNTER — Ambulatory Visit (INDEPENDENT_AMBULATORY_CARE_PROVIDER_SITE_OTHER): Payer: Managed Care, Other (non HMO) | Admitting: Nurse Practitioner

## 2019-07-09 ENCOUNTER — Encounter: Payer: Self-pay | Admitting: Nurse Practitioner

## 2019-07-09 VITALS — BP 142/93 | HR 96 | Temp 98.7°F | Ht 65.0 in | Wt 172.9 lb

## 2019-07-09 DIAGNOSIS — K529 Noninfective gastroenteritis and colitis, unspecified: Secondary | ICD-10-CM | POA: Diagnosis not present

## 2019-07-09 MED ORDER — METRONIDAZOLE 500 MG PO TABS: 500.0000 mg | ORAL_TABLET | Freq: Two times a day (BID) | ORAL | 0 refills | Status: DC

## 2019-07-09 MED ORDER — DICYCLOMINE HCL 10 MG PO CAPS: 10.0000 mg | ORAL_CAPSULE | Freq: Three times a day (TID) | ORAL | 3 refills | Status: DC | PRN

## 2019-07-09 MED ORDER — PROMETHAZINE HCL 25 MG PO TABS: 25.0000 mg | ORAL_TABLET | Freq: Four times a day (QID) | ORAL | 2 refills | Status: DC | PRN

## 2019-07-09 NOTE — Patient Instructions (Addendum)
You may consider trying acidophilus capsules that are available over the counter at the pharmacy, usually in the vitamin section- this is typically a less expensive alternative to a general pro-biotic formula.   BRAT diet_ Bananas, Rice, Applesauce, Toast in small quantities for at least 24 hours.   If your diarrhea does not have blood and you are not running a fever, you can use Imodium to help control the excessive stool  An over the counter acid reducer may help the sour stomach  Stay very hydrated. Avoid drinks with sugar in them.    Food Choices to Help Relieve Diarrhea, Adult When you have diarrhea, the foods you eat and your eating habits are very important. Choosing the right foods and drinks can help:  Relieve diarrhea.  Replace lost fluids and nutrients.  Prevent dehydration. What general guidelines should I follow?  Relieving diarrhea  Choose foods with less than 2 g or .07 oz. of fiber per serving.  Limit fats to less than 8 tsp (38 g or 1.34 oz.) a day.  Avoid the following: ? Foods and beverages sweetened with high-fructose corn syrup, honey, or sugar alcohols such as xylitol, sorbitol, and mannitol. ? Foods that contain a lot of fat or sugar. ? Fried, greasy, or spicy foods. ? High-fiber grains, breads, and cereals. ? Raw fruits and vegetables.  Eat foods that are rich in probiotics. These foods include dairy products such as yogurt and fermented milk products. They help increase healthy bacteria in the stomach and intestines (gastrointestinal tract, or GI tract).  If you have lactose intolerance, avoid dairy products. These may make your diarrhea worse.  Take medicine to help stop diarrhea (antidiarrheal medicine) only as told by your health care provider. Replacing nutrients  Eat small meals or snacks every 3-4 hours.  Eat bland foods, such as white rice, toast, or baked potato, until your diarrhea starts to get better. Gradually reintroduce nutrient-rich  foods as tolerated or as told by your health care provider. This includes: ? Well-cooked protein foods. ? Peeled, seeded, and soft-cooked fruits and vegetables. ? Low-fat dairy products.  Take vitamin and mineral supplements as told by your health care provider. Preventing dehydration  Start by sipping water or a special solution to prevent dehydration (oral rehydration solution, ORS). Urine that is clear or pale yellow means that you are getting enough fluid.  Try to drink at least 8-10 cups of fluid each day to help replace lost fluids.  You may add other liquids in addition to water, such as clear juice or decaffeinated sports drinks, as tolerated or as told by your health care provider.  Avoid drinks with caffeine, such as coffee, tea, or soft drinks.  Avoid alcohol. What foods are recommended?     The items listed may not be a complete list. Talk with your health care provider about what dietary choices are best for you. Grains White rice. White, [place], or pita breads (fresh or toasted), including plain rolls, buns, or bagels. White pasta. Saltine, soda, or graham crackers. Pretzels. Low-fiber cereal. Cooked cereals made with water (such as cornmeal, farina, or cream cereals). Plain muffins. Matzo. Melba toast. Zwieback. Vegetables Potatoes (without the skin). Most well-cooked and canned vegetables without skins or seeds. Tender lettuce. Fruits Apple sauce. Fruits canned in juice. Cooked apricots, cherries, grapefruit, peaches, pears, or plums. Fresh bananas and cantaloupe. Meats and other protein foods Baked or boiled chicken. Eggs. Tofu. Fish. Seafood. Smooth nut butters. Ground or well-cooked tender beef, ham, veal, lamb,  pork, or poultry. Dairy Plain yogurt, kefir, and unsweetened liquid yogurt. Lactose-free milk, buttermilk, skim milk, or soy milk. Low-fat or nonfat hard cheese. Beverages Water. Low-calorie sports drinks. Fruit juices without pulp. Strained tomato and  vegetable juices. Decaffeinated teas. Sugar-free beverages not sweetened with sugar alcohols. Oral rehydration solutions, if approved by your health care provider. Seasoning and other foods Bouillon, broth, or soups made from recommended foods. What foods are not recommended? The items listed may not be a complete list. Talk with your health care provider about what dietary choices are best for you. Grains Whole grain, whole wheat, bran, or rye breads, rolls, pastas, and crackers. Wild or brown rice. Whole grain or bran cereals. Barley. Oats and oatmeal. Corn tortillas or taco shells. Granola. Popcorn. Vegetables Raw vegetables. Fried vegetables. Cabbage, broccoli, Brussels sprouts, artichokes, baked beans, beet greens, corn, kale, legumes, peas, sweet potatoes, and yams. Potato skins. Cooked spinach and cabbage. Fruits Dried fruit, including raisins and dates. Raw fruits. Stewed or dried prunes. Canned fruits with syrup. Meat and other protein foods Fried or fatty meats. Deli meats. Chunky nut butters. Nuts and seeds. Beans and lentils. Tomasa Blase. Hot dogs. Sausage. Dairy High-fat cheeses. Whole milk, chocolate milk, and beverages made with milk, such as milk shakes. Half-and-half. Cream. sour cream. Ice cream. Beverages Caffeinated beverages (such as coffee, tea, soda, or energy drinks). Alcoholic beverages. Fruit juices with pulp. Prune juice. Soft drinks sweetened with high-fructose corn syrup or sugar alcohols. High-calorie sports drinks. Fats and oils Butter. Cream sauces. Margarine. Salad oils. Plain salad dressings. Olives. Avocados. Mayonnaise. Sweets and desserts Sweet rolls, doughnuts, and sweet breads. Sugar-free desserts sweetened with sugar alcohols such as xylitol and sorbitol. Seasoning and other foods Honey. Hot sauce. Chili powder. Gravy. Cream-based or milk-based soups. Pancakes and waffles. Summary  When you have diarrhea, the foods you eat and your eating habits are very  important.  Make sure you get at least 8-10 cups of fluid each day, or enough to keep your urine clear or pale yellow.  Eat bland foods and gradually reintroduce healthy, nutrient-rich foods as tolerated, or as told by your health care provider.  Avoid high-fiber, fried, greasy, or spicy foods. This information is not intended to replace advice given to you by your health care provider. Make sure you discuss any questions you have with your health care provider. Document Revised: 04/10/2018 Document Reviewed: 12/16/2015 Elsevier Patient Education  2020 [employer]   Diarrhea, Adult Diarrhea is frequent loose and watery bowel movements. Diarrhea can make you feel weak and cause you to become dehydrated. Dehydration can make you tired and thirsty, cause you to have a dry mouth, and decrease how often you urinate. Diarrhea typically lasts 2-3 days. However, it can last longer if it is a sign of something more serious. It is important to treat your diarrhea as told by your health care provider. Follow these instructions at home: Eating and drinking     Follow these recommendations as told by your health care provider:  Take an oral rehydration solution (ORS). This is an over-the-counter medicine that helps return your body to its normal balance of nutrients and water. It is found at pharmacies and retail stores.  Drink plenty of fluids, such as water, ice chips, diluted fruit juice, and low-calorie sports drinks. You can drink milk also, if desired.  Avoid drinking fluids that contain a lot of sugar or caffeine, such as energy drinks, sports drinks, and soda.  Eat bland, easy-to-digest foods in small  amounts as you are able. These foods include bananas, applesauce, rice, lean meats, toast, and crackers.  Avoid alcohol.  Avoid spicy or fatty foods.  Medicines  Take over-the-counter and prescription medicines only as told by your health care provider.  If you were prescribed an  antibiotic medicine, take it as told by your health care provider. Do not stop using the antibiotic even if you start to feel better. General instructions   Wash your hands often using soap and water. If soap and water are not available, use a hand sanitizer. Others in the household should wash their hands as well. Hands should be washed: ? After using the toilet or changing a diaper. ? Before preparing, cooking, or serving food. ? While caring for a sick person or while visiting someone in a hospital.  Drink enough fluid to keep your urine pale yellow.  Rest at home while you recover.  Watch your condition for any changes.  Take a warm bath to relieve any burning or pain from frequent diarrhea episodes.  Keep all follow-up visits as told by your health care provider. This is important. Contact a health care provider if:  You have a fever.  Your diarrhea gets worse.  You have new symptoms.  You cannot keep fluids down.  You feel light-headed or dizzy.  You have a headache.  You have muscle cramps. Get help right away if:  You have chest pain.  You feel extremely weak or you faint.  You have bloody or black stools or stools that look like tar.  You have severe pain, cramping, or bloating in your abdomen.  You have trouble breathing or you are breathing very quickly.  Your heart is beating very quickly.  Your skin feels cold and clammy.  You feel confused.  You have signs of dehydration, such as: ? Dark urine, very little urine, or no urine. ? Cracked lips. ? Dry mouth. ? Sunken eyes. ? Sleepiness. ? Weakness. Summary  Diarrhea is frequent loose and watery bowel movements. Diarrhea can make you feel weak and cause you to become dehydrated.  Drink enough fluids to keep your urine pale yellow.  Make sure that you wash your hands after using the toilet. If soap and water are not available, use hand sanitizer.  Contact a health care provider if your  diarrhea gets worse or you have new symptoms.  Get help right away if you have signs of dehydration. This information is not intended to replace advice given to you by your health care provider. Make sure you discuss any questions you have with your health care provider. Document Revised: 05/06/2018 Document Reviewed: 05/24/2017 Elsevier Patient Education  2020 [employer].

## 2019-07-09 NOTE — Progress Notes (Signed)
Acute Office Visit  Subjective:    Patient ID: Alicia Sanders, female    DOB: [birth year], 39 y.o.   MRN: [id number]  Chief Complaint  Patient presents with  . Diarrhea    onset:3d, severe diarreah, decreased appetite, has gone 5 times this morning, has not tried taking anything for it, is afraid to eat    HPI Patient is in today for sudden onset of severe abdominal pain, nausea, "sour stomach", vomiting, and diarrhea that started on Monday. She reports that she only vomited on the first day of symptoms, but has frequently felt like she may throw up again and is very nauseated. She and her family all ate chicken together on Sunday evening and she has questioned whether this could be the source of her symptoms, however no one else in her family has become ill. The only think that she did differently that she can recall is she went swimming in [place] on Sunday evening. At the time, the water was very warm.   She endorses diffuse abdominal cramping, repeated and frequent diarrhea, nausea, gastric reflux, belching, and soreness to the mucous membranes in her mouth.   She reports difficulty eating, but she is able to drink OK. She states that the diarrhea is completely liquid in consistency and she has had to go to the bathroom greater than 6 times a day.   She denies fever, chills, bloody diarrhea, or hematemesis. She denies any other symptoms, except those listed above.   Past Medical History:  Diagnosis Date  . Back problem   . Bilateral lower abdominal discomfort 03/05/2018  . Bloating 03/05/2018  . Change in bowel habits 09/23/2017  . Cough 01/23/2017  . Eczema   . Hypothyroidism   . No energy 03/05/2018  . Pleuritic chest pain 01/27/2016  . Preventive measure 05/08/2012  . Weight gain 04/18/2015    Past Surgical History:  Procedure Laterality Date  . HAND SURGERY Right   . INTRAUTERINE DEVICE INSERTION    . LAPAROSCOPIC APPENDECTOMY N/A 01/08/2019   Procedure: APPENDECTOMY  LAPAROSCOPIC;  Surgeon: Harriette Bouillon, MD;  Location: [facility];  Service: General;  Laterality: N/A;    Family History  Problem Relation Age of Onset  . Heart disease Father   . Heart disease Brother   . Diabetes Maternal Grandmother   . Lung cancer Maternal Grandfather   . Heart disease Paternal Grandfather   . Endometrial cancer Mother   . Colon cancer Neg Hx   . Esophageal cancer Neg Hx   . Inflammatory bowel disease Neg Hx   . Liver disease Neg Hx   . Pancreatic cancer Neg Hx   . Rectal cancer Neg Hx   . Stomach cancer Neg Hx     Social History   Socioeconomic History  . Marital status: Married    Spouse name: Not on file  . Number of children: 2  . Years of education: Not on file  . Highest education level: Not on file  Occupational History  . Occupation: [employer] tech  Tobacco Use  . Smoking status: Never Smoker  . Smokeless tobacco: Never Used  Substance and Sexual Activity  . Alcohol use: Yes    Comment: occasional  . Drug use: Never  . Sexual activity: Not on file  Other Topics Concern  . Not on file  Social History Narrative  . Not on file   Social Determinants of Health   Financial Resource Strain:   . Difficulty of Paying Living Expenses:  Food Insecurity:   . Worried About Programme researcher, broadcasting/film/videounning Out of Food in the Last Year:   . Baristaan Out of Food in the Last Year:   Transportation Needs:   . Freight forwarderLack of Transportation (Medical):   Marland Kitchen. Lack of Transportation (Non-Medical):   Physical Activity:   . Days of Exercise per Week:   . Minutes of Exercise per Session:   Stress:   . Feeling of Stress :   Social Connections:   . Frequency of Communication with Friends and Family:   . Frequency of Social Gatherings with Friends and Family:   . Attends Religious Services:   . Active Member of Clubs or Organizations:   . Attends BankerClub or Organization Meetings:   Marland Kitchen. Marital Status:   Intimate Partner Violence:   . Fear of Current or Ex-Partner:   . Emotionally Abused:   Marland Kitchen.  Physically Abused:   . Sexually Abused:     Outpatient Medications Prior to Visit  Medication Sig Dispense Refill  . albuterol (PROVENTIL HFA;VENTOLIN HFA) 108 (90 Base) MCG/ACT inhaler 2 puffs 15 minutes before physical activity and exercise 2 Inhaler 11  . buPROPion (WELLBUTRIN XL) 300 MG 24 hr tablet TAKE 1 TABLET (300 MG TOTAL) BY MOUTH DAILY. 90 tablet 3  . levothyroxine (SYNTHROID) 50 MCG tablet Take 1 tablet (50 mcg total) by mouth daily. Needs labs 30 tablet 0  . montelukast (SINGULAIR) 10 MG tablet TAKE 1 TABLET BY MOUTH EVERYDAY AT BEDTIME (Patient taking differently: Take 10 mg by mouth at bedtime. ) 30 tablet 11  . ondansetron (ZOFRAN-ODT) 8 MG disintegrating tablet Take 1 tablet (8 mg total) by mouth every 8 (eight) hours as needed for nausea. 20 tablet 3  . traMADol (ULTRAM) 50 MG tablet Take 1 tablet (50 mg total) by mouth 3 (three) times daily as needed. 270 tablet 0  . tretinoin (RETIN-A) 0.05 % cream Apply 1 application topically daily as needed (ezma).     . Vitamin D, Ergocalciferol, (DRISDOL) 1.25 MG (50000 UNIT) CAPS capsule Take 1 capsule (50,000 Units total) by mouth every 7 (seven) days. 12 capsule 0   No facility-administered medications prior to visit.    Allergies  Allergen Reactions  . Thimerosal Swelling and Other (See Comments)    Blisters and swelling at injection site; significant malaise.  CAN tolerate preservative-free flu vaccine (10/24/16)  . Adhesive [Tape] Other (See Comments)    Blister and redness/ longer tears skin       Objective:    Physical Exam Vitals and nursing note reviewed.  Constitutional:      Appearance: She is ill-appearing.  HENT:     Head: Normocephalic.     Mouth/Throat:     Lips: Pink.     Mouth: Mucous membranes are moist. No injury, lacerations, oral lesions or angioedema.     Tongue: No lesions.     Palate: No mass.     Pharynx: Oropharynx is clear. Uvula midline. No pharyngeal swelling, oropharyngeal exudate or  posterior oropharyngeal erythema.     Tonsils: No tonsillar exudate or tonsillar abscesses.  Eyes:     Extraocular Movements: Extraocular movements intact.     Conjunctiva/sclera: Conjunctivae normal.     Pupils: Pupils are equal, round, and reactive to light.  Cardiovascular:     Rate and Rhythm: Regular rhythm. Tachycardia present.     Pulses: Normal pulses.     Heart sounds: Normal heart sounds.  Pulmonary:     Effort: Pulmonary effort is normal.  Breath sounds: Normal breath sounds.  Abdominal:     General: Bowel sounds are increased. There is distension. There is no abdominal bruit.     Palpations: Abdomen is soft. There is no shifting dullness, hepatomegaly, splenomegaly, mass or pulsatile mass.     Tenderness: There is generalized abdominal tenderness. There is no right CVA tenderness, left CVA tenderness, guarding or rebound. Negative signs include Murphy's sign, Rovsing's sign and McBurney's sign.     Hernia: No hernia is present.  Musculoskeletal:        General: Normal range of motion.     Cervical back: Normal range of motion.  Lymphadenopathy:     Cervical: No cervical adenopathy.  Skin:    General: Skin is warm and dry.     Capillary Refill: Capillary refill takes less than 2 seconds.  Neurological:     General: No focal deficit present.     Mental Status: She is alert and oriented to person, place, and time.  Psychiatric:        Mood and Affect: Mood normal.        Behavior: Behavior normal.        Thought Content: Thought content normal.        Judgment: Judgment normal.     Ht 5\' 5"  (1.651 m)   Wt 172 lb 14.4 oz (78.4 kg)   BMI 28.77 kg/m  Wt Readings from Last 3 Encounters:  07/09/19 172 lb 14.4 oz (78.4 kg)  06/29/19 170 lb (77.1 kg)  01/08/19 170 lb (77.1 kg)    There are no preventive care reminders to display for this patient.  There are no preventive care reminders to display for this patient.   Lab Results  Component Value Date   TSH  1.45 07/01/2019   Lab Results  Component Value Date   WBC 9.1 01/08/2019   HGB 14.1 01/08/2019   HCT 42.9 01/08/2019   MCV 89.4 01/08/2019   PLT 254 01/08/2019   Lab Results  Component Value Date   NA 139 07/01/2019   K 4.2 07/01/2019   CO2 28 07/01/2019   GLUCOSE 86 07/01/2019   BUN 10 07/01/2019   CREATININE 0.85 07/01/2019   BILITOT 0.7 07/01/2019   ALKPHOS 70 01/08/2019   AST 11 07/01/2019   ALT 14 07/01/2019   PROT 7.8 07/01/2019   ALBUMIN 4.4 01/08/2019   CALCIUM 9.8 07/01/2019   ANIONGAP 9 01/08/2019   GFR 87.16 09/23/2017   Lab Results  Component Value Date   CHOL 155 07/01/2019   Lab Results  Component Value Date   HDL 41 (L) 07/01/2019   Lab Results  Component Value Date   LDLCALC 99 07/01/2019   Lab Results  Component Value Date   TRIG 68 07/01/2019   Lab Results  Component Value Date   CHOLHDL 3.8 07/01/2019   Lab Results  Component Value Date   HGBA1C 4.8 09/27/2015       Assessment & Plan:  1. Gastroenteritis Symptoms and presentation consistent with acute gastroenteritis with suspected protozoal infection from [place]. It is also possible she has become ill from undercooked chicken, however, given that no one else who ate the food became ill, this is less likely. Discussed conservative management options including imodium to help decrease instances of diarrhea, acid reducer to decrease belching and reflux of gastric contents, increased liquid consumption with water or oral rehydration solution without sugar, and slowly reintroducing well tolerated foods, such as bananas,  rice, applesauce, and toast. We will treat empirically for suspected protozoal infection with oral metronidazole for 5 days and add dicyclomine for abdominal cramping. Prescription for phenergan for severe nausea and vomiting also included. Discussed symptoms that would warrant re-evaluation such as fever, chills, bloody diarrhea, or lack of symptom  resolution with treatment. Instructions to seek emergency care if she is unable to hold down liquids, experiences dizziness, chest pain, shortness of breath, or worsening abdominal pain.   PLAN: - metroNIDAZOLE (FLAGYL) 500 MG tablet; Take 1 tablet (500 mg total) by mouth 2 (two) times daily.  Dispense: 10 tablet; Refill: 0 - promethazine (PHENERGAN) 25 MG tablet; Take 1 tablet (25 mg total) by mouth every 6 (six) hours as needed for nausea or vomiting.  Dispense: 30 tablet; Refill: 2 - dicyclomine (BENTYL) 10 MG capsule; Take 1 capsule (10 mg total) by mouth 3 (three) times daily as needed for spasms.  Dispense: 30 capsule; Refill: 3 - If symptoms do not improve with empiric treatment for protozoal infection, plan for stool culture.  - Stay very well hydrated- THIS IS VERY IMPORTANT.  - You may take over the counter acidophilus or other GI probiotic to help with symptoms - You may take imodium (as directed on the packaging) as long as you are not experiencing blood in your stool or fevers.  - If your symptoms worsen or fail to improve, please follow-up with this office or emergency services.   Return if symptoms worsen or fail to improve.    Tollie Eth, NP

## 2019-07-27 ENCOUNTER — Encounter: Payer: Self-pay | Admitting: Physician Assistant

## 2019-07-27 MED ORDER — LEVOTHYROXINE SODIUM 50 MCG PO TABS: 50.0000 ug | ORAL_TABLET | Freq: Every day | ORAL | 0 refills | Status: DC

## 2019-08-05 ENCOUNTER — Encounter (INDEPENDENT_AMBULATORY_CARE_PROVIDER_SITE_OTHER): Payer: Self-pay | Admitting: Otolaryngology

## 2019-08-05 ENCOUNTER — Other Ambulatory Visit: Payer: Self-pay

## 2019-08-05 ENCOUNTER — Other Ambulatory Visit (INDEPENDENT_AMBULATORY_CARE_PROVIDER_SITE_OTHER): Payer: Self-pay

## 2019-08-05 ENCOUNTER — Ambulatory Visit (INDEPENDENT_AMBULATORY_CARE_PROVIDER_SITE_OTHER): Payer: Managed Care, Other (non HMO) | Admitting: Otolaryngology

## 2019-08-05 VITALS — Temp 97.5°F

## 2019-08-05 DIAGNOSIS — R0683 Snoring: Secondary | ICD-10-CM

## 2019-08-05 DIAGNOSIS — J342 Deviated nasal septum: Secondary | ICD-10-CM

## 2019-08-05 DIAGNOSIS — J329 Chronic sinusitis, unspecified: Secondary | ICD-10-CM

## 2019-08-05 DIAGNOSIS — J310 Chronic rhinitis: Secondary | ICD-10-CM | POA: Diagnosis not present

## 2019-08-05 NOTE — Progress Notes (Signed)
HPI: Alicia Sanders is a 39 y.o. female who presents is referred by her PCP for evaluation of chronic sinus issues as well as recent onset of snoring.  She is always had chronic nasal sinus issues with intermittent nasal obstruction and pressure in the sinuses that comes and goes.  She has recently been started on Flonase and allergy medication. Her husband complains about her snoring that started over the last year or 2.  She has tried Breathe Right strips which seemed to help the snoring some but is unable to tolerate them as they irritate her skin on the nose. Concerning her sinuses she describes thick mucus discharge as well as pressure in the sinuses. She also states that she has some hearing loss in her family and wanted her hearing checked. She is also used a Nettie pot to help with the nasal sinus issues.  Past Medical History:  Diagnosis Date  . Back problem   . Bilateral lower abdominal discomfort 03/05/2018  . Bloating 03/05/2018  . Change in bowel habits 09/23/2017  . Cough 01/23/2017  . Eczema   . Hypothyroidism   . No energy 03/05/2018  . Pleuritic chest pain 01/27/2016  . Preventive measure 05/08/2012  . Weight gain 04/18/2015   Past Surgical History:  Procedure Laterality Date  . HAND SURGERY Right   . INTRAUTERINE DEVICE INSERTION    . LAPAROSCOPIC APPENDECTOMY N/A 01/08/2019   Procedure: APPENDECTOMY LAPAROSCOPIC;  Surgeon: Harriette Bouillon, MD;  Location: [facility];  Service: General;  Laterality: N/A;   Social History   Socioeconomic History  . Marital status: Married    Spouse name: Not on file  . Number of children: 2  . Years of education: Not on file  . Highest education level: Not on file  Occupational History  . Occupation: [employer] tech  Tobacco Use  . Smoking status: Never Smoker  . Smokeless tobacco: Never Used  Substance and Sexual Activity  . Alcohol use: Yes    Comment: occasional  . Drug use: Never  . Sexual activity: Not on file  Other Topics Concern  . Not  on file  Social History Narrative  . Not on file   Social Determinants of Health   Financial Resource Strain:   . Difficulty of Paying Living Expenses:   Food Insecurity:   . Worried About Programme researcher, broadcasting/film/video in the Last Year:   . Barista in the Last Year:   Transportation Needs:   . Freight forwarder (Medical):   Marland Kitchen Lack of Transportation (Non-Medical):   Physical Activity:   . Days of Exercise per Week:   . Minutes of Exercise per Session:   Stress:   . Feeling of Stress :   Social Connections:   . Frequency of Communication with Friends and Family:   . Frequency of Social Gatherings with Friends and Family:   . Attends Religious Services:   . Active Member of Clubs or Organizations:   . Attends Banker Meetings:   Marland Kitchen Marital Status:    Family History  Problem Relation Age of Onset  . Heart disease Father   . Heart disease Brother   . Diabetes Maternal Grandmother   . Lung cancer Maternal Grandfather   . Heart disease Paternal Grandfather   . Endometrial cancer Mother   . Colon cancer Neg Hx   . Esophageal cancer Neg Hx   . Inflammatory bowel disease Neg Hx   . Liver disease Neg Hx   .  Pancreatic cancer Neg Hx   . Rectal cancer Neg Hx   . Stomach cancer Neg Hx    Allergies  Allergen Reactions  . Thimerosal Swelling and Other (See Comments)    Blisters and swelling at injection site; significant malaise.  CAN tolerate preservative-free flu vaccine (10/24/16)  . Adhesive [Tape] Other (See Comments)    Blister and redness/ longer tears skin   Prior to Admission medications   Medication Sig Start Date End Date Taking? Authorizing Provider  albuterol (PROVENTIL HFA;VENTOLIN HFA) 108 (90 Base) MCG/ACT inhaler 2 puffs 15 minutes before physical activity and exercise 02/11/17  Yes Monica Becton, MD  buPROPion (WELLBUTRIN XL) 300 MG 24 hr tablet TAKE 1 TABLET (300 MG TOTAL) BY MOUTH DAILY. 06/29/19  Yes Breeback, [name] L, PA-C  dicyclomine  (BENTYL) 10 MG capsule Take 1 capsule (10 mg total) by mouth 3 (three) times daily as needed for spasms. 07/09/19  Yes Early, Sung Amabile, NP  levothyroxine (SYNTHROID) 50 MCG tablet Take 1 tablet (50 mcg total) by mouth daily. 07/27/19  Yes Breeback, [name] L, PA-C  metroNIDAZOLE (FLAGYL) 500 MG tablet Take 1 tablet (500 mg total) by mouth 2 (two) times daily. 07/09/19  Yes Early, Sung Amabile, NP  montelukast (SINGULAIR) 10 MG tablet TAKE 1 TABLET BY MOUTH EVERYDAY AT BEDTIME Patient taking differently: Take 10 mg by mouth at bedtime.  05/29/18  Yes Breeback, [name] L, PA-C  ondansetron (ZOFRAN-ODT) 8 MG disintegrating tablet Take 1 tablet (8 mg total) by mouth every 8 (eight) hours as needed for nausea. 01/06/19  Yes Early, Sung Amabile, NP  promethazine (PHENERGAN) 25 MG tablet Take 1 tablet (25 mg total) by mouth every 6 (six) hours as needed for nausea or vomiting. 07/09/19  Yes Early, Sung Amabile, NP  traMADol (ULTRAM) 50 MG tablet Take 1 tablet (50 mg total) by mouth 3 (three) times daily as needed. 06/29/19  Yes Breeback, [name] L, PA-C  tretinoin (RETIN-A) 0.05 % cream Apply 1 application topically daily as needed (ezma).  08/08/18  Yes [provider]  Vitamin D, Ergocalciferol, (DRISDOL) 1.25 MG (50000 UNIT) CAPS capsule Take 1 capsule (50,000 Units total) by mouth every 7 (seven) days. 07/02/19  Yes Breeback, [name] L, PA-C     Positive ROS: Otherwise negative  All other systems have been reviewed and were otherwise negative with the exception of those mentioned in the HPI and as above.  Physical Exam: Constitutional: Alert, well-appearing, no acute distress Ears: External ears without lesions or tenderness. Ear canals are clear bilaterally with intact, clear TMs bilaterally. Audiogram demonstrated normal hearing in both ears with type A tympanograms bilaterally.  SRT's were 10 and 5 dB in the right and left ear. Nasal: External nose without lesions. Septum slightly deviated to the right with moderate turbinate  hypertrophy and mild rhinitis..  Middle meatus regions were clear.  No polyps noted.  She did have slight nasal valve collapse on both sides. Oral: Lips and gums without lesions. Tongue and palate mucosa without lesions. Posterior oropharynx clear.  Tonsils are moderate size bilaterally 2-3+. Neck: No palpable adenopathy or masses Respiratory: Breathing comfortably  Skin: No facial/neck lesions or rash noted.  Procedures  Assessment: Normal hearing evaluation. Mild rhinitis with septal deviation and turbinate hypertrophy. Snoring new onset over the last year or 2  Plan: Reviewed snoring with [place].  Nasal congestion will increase snoring and recommended regular use of the Flonase at night.  She can occasionally try Afrin to see if this helps  with the snoring.  Also discussed positioning with her as when she lies on her back she will tend to have more snoring. Could consider surgical intervention if nasal congestion is a major cause of her snoring. Because of history of "sinus" issues and pressure in the sinuses we will plan on scheduling CT scan of the sinuses.  She will call us concerning results of the CT scan following completion. Briefly reviewed surgical options with her including possible tonsillectomy.   Narda Bonds, MD   CC:

## 2019-08-06 ENCOUNTER — Encounter (INDEPENDENT_AMBULATORY_CARE_PROVIDER_SITE_OTHER): Payer: Self-pay

## 2019-08-06 ENCOUNTER — Ambulatory Visit
Admission: RE | Admit: 2019-08-06 | Discharge: 2019-08-06 | Disposition: A | Discharge status: Home / Self Care | Payer: Managed Care, Other (non HMO) | Source: Ambulatory Visit | Attending: Otolaryngology | Admitting: Otolaryngology

## 2019-08-06 DIAGNOSIS — J329 Chronic sinusitis, unspecified: Secondary | ICD-10-CM

## 2019-09-16 ENCOUNTER — Other Ambulatory Visit: Payer: Self-pay | Admitting: Physician Assistant

## 2019-09-16 DIAGNOSIS — E559 Vitamin D deficiency, unspecified: Secondary | ICD-10-CM

## 2019-09-21 ENCOUNTER — Other Ambulatory Visit: Payer: Self-pay | Admitting: Physician Assistant

## 2019-09-21 DIAGNOSIS — M5030 Other cervical disc degeneration, unspecified cervical region: Secondary | ICD-10-CM

## 2019-09-21 MED ORDER — TRAMADOL HCL 50 MG PO TABS: 50.0000 mg | ORAL_TABLET | Freq: Three times a day (TID) | ORAL | 0 refills | Status: DC | PRN

## 2019-09-21 NOTE — Telephone Encounter (Signed)
She is not due until 10/1 I will post date.

## 2019-10-19 ENCOUNTER — Other Ambulatory Visit: Payer: Self-pay | Admitting: Physician Assistant

## 2019-11-16 ENCOUNTER — Encounter: Payer: Self-pay | Admitting: Physician Assistant

## 2019-12-14 ENCOUNTER — Other Ambulatory Visit: Payer: Self-pay | Admitting: Physician Assistant

## 2019-12-17 ENCOUNTER — Other Ambulatory Visit: Payer: Self-pay | Admitting: Physician Assistant

## 2019-12-17 DIAGNOSIS — M5030 Other cervical disc degeneration, unspecified cervical region: Secondary | ICD-10-CM

## 2019-12-22 MED ORDER — TRAMADOL HCL 50 MG PO TABS: 50.0000 mg | ORAL_TABLET | Freq: Three times a day (TID) | ORAL | 0 refills | Status: DC | PRN

## 2020-01-08 ENCOUNTER — Other Ambulatory Visit: Payer: Self-pay | Admitting: Physician Assistant

## 2020-01-14 ENCOUNTER — Other Ambulatory Visit: Payer: Self-pay | Admitting: Physician Assistant

## 2020-03-07 LAB — RESULTS CONSOLE HPV: CHL HPV: NEGATIVE

## 2020-03-22 ENCOUNTER — Other Ambulatory Visit: Payer: Self-pay | Admitting: Physician Assistant

## 2020-03-22 DIAGNOSIS — M5030 Other cervical disc degeneration, unspecified cervical region: Secondary | ICD-10-CM

## 2020-03-23 MED ORDER — TRAMADOL HCL 50 MG PO TABS: 50.0000 mg | ORAL_TABLET | Freq: Three times a day (TID) | ORAL | 0 refills | Status: DC | PRN

## 2020-03-23 NOTE — Telephone Encounter (Signed)
LVM for patient to call back to get appt scheduled. AM 

## 2020-03-23 NOTE — Telephone Encounter (Signed)
Patient needs an appointment

## 2020-04-01 ENCOUNTER — Encounter: Payer: Self-pay | Admitting: Physician Assistant

## 2020-05-06 ENCOUNTER — Other Ambulatory Visit: Payer: Self-pay

## 2020-05-06 ENCOUNTER — Encounter: Payer: Self-pay | Admitting: Sports Medicine

## 2020-05-06 ENCOUNTER — Ambulatory Visit (INDEPENDENT_AMBULATORY_CARE_PROVIDER_SITE_OTHER): Payer: Managed Care, Other (non HMO)

## 2020-05-06 ENCOUNTER — Ambulatory Visit: Payer: Managed Care, Other (non HMO) | Admitting: Sports Medicine

## 2020-05-06 DIAGNOSIS — M5030 Other cervical disc degeneration, unspecified cervical region: Secondary | ICD-10-CM

## 2020-05-06 MED ORDER — HYDROCODONE-ACETAMINOPHEN 5-325 MG PO TABS: 1.0000 | ORAL_TABLET | Freq: Three times a day (TID) | ORAL | 0 refills | Status: DC | PRN

## 2020-05-06 MED ORDER — PREDNISONE 50 MG PO TABS: ORAL_TABLET | ORAL | 0 refills | Status: DC

## 2020-05-06 NOTE — Assessment & Plan Note (Signed)
This is a pleasant 40 year old female, she has cervical degenerative disc disease, she is now having an exacerbation of the chronic process with severe neck pain radiating into the left periscapular region, significant loss of motion. I do think she is in cervical spasm today. Adding 5 days of prednisone, updated x-rays, formal physical therapy, we will switch from tramadol to hydrocodone for a week. Return to see me in a month, MRI for interventional planning if no better.

## 2020-05-06 NOTE — Progress Notes (Signed)
    Procedures performed today:    None.  Independent interpretation of notes and tests performed by another provider:   None.  Brief History, Exam, Impression, and Recommendations:    DDD (degenerative disc disease), cervical This is a pleasant 40 year old female, she has cervical degenerative disc disease, she is now having an exacerbation of the chronic process with severe neck pain radiating into the left periscapular region, significant loss of motion. I do think she is in cervical spasm today. Adding 5 days of prednisone, updated x-rays, formal physical therapy, we will switch from tramadol to hydrocodone for a week. Return to see me in a month, MRI for interventional planning if no better.    ___________________________________________ Ihor Austin. Benjamin Stain, M.D., ABFM., CAQSM. Primary Care and Sports Medicine [facility]  Adjunct Instructor of Family Medicine  [employer]

## 2020-05-09 ENCOUNTER — Encounter (INDEPENDENT_AMBULATORY_CARE_PROVIDER_SITE_OTHER): Payer: Managed Care, Other (non HMO)

## 2020-05-09 DIAGNOSIS — M5030 Other cervical disc degeneration, unspecified cervical region: Secondary | ICD-10-CM | POA: Diagnosis not present

## 2020-05-09 MED ORDER — OXYCODONE-ACETAMINOPHEN 10-325 MG PO TABS: 1.0000 | ORAL_TABLET | Freq: Three times a day (TID) | ORAL | 0 refills | Status: DC | PRN

## 2020-05-24 MED ORDER — PREDNISONE 10 MG (48) PO TBPK: ORAL_TABLET | Freq: Every day | ORAL | 0 refills | Status: DC

## 2020-05-24 MED ORDER — OXYCODONE-ACETAMINOPHEN 10-325 MG PO TABS: 1.0000 | ORAL_TABLET | Freq: Three times a day (TID) | ORAL | 0 refills | Status: DC | PRN

## 2020-05-24 NOTE — Addendum Note (Signed)
Addended by: Monica Becton on: 05/24/2020 11:43 AM   Modules accepted: Orders

## 2020-05-24 NOTE — Telephone Encounter (Signed)
I spent 5 total minutes of online digital evaluation and management services. 

## 2020-05-25 ENCOUNTER — Ambulatory Visit: Payer: Managed Care, Other (non HMO) | Attending: Sports Medicine | Admitting: Physical Therapy

## 2020-06-03 ENCOUNTER — Ambulatory Visit: Payer: Managed Care, Other (non HMO) | Admitting: Sports Medicine

## 2020-06-18 ENCOUNTER — Other Ambulatory Visit: Payer: Self-pay | Admitting: Physician Assistant

## 2020-06-18 DIAGNOSIS — F39 Unspecified mood [affective] disorder: Secondary | ICD-10-CM

## 2020-06-20 ENCOUNTER — Other Ambulatory Visit: Payer: Self-pay | Admitting: Physician Assistant

## 2020-06-20 DIAGNOSIS — M5030 Other cervical disc degeneration, unspecified cervical region: Secondary | ICD-10-CM

## 2020-06-20 NOTE — Telephone Encounter (Signed)
Last written 03/23/2020 #270 no refills Last appt with Dr. Karie Schwalbe 05/06/2020, has not seen Trichelle Lehan > year

## 2020-06-20 NOTE — Telephone Encounter (Signed)
I have not seen her in over a year. You saw her 05/06/2020. Would you refill please.

## 2020-07-13 ENCOUNTER — Other Ambulatory Visit: Payer: Self-pay | Admitting: Physician Assistant

## 2020-09-14 ENCOUNTER — Encounter (INDEPENDENT_AMBULATORY_CARE_PROVIDER_SITE_OTHER): Payer: Managed Care, Other (non HMO)

## 2020-09-14 DIAGNOSIS — M5030 Other cervical disc degeneration, unspecified cervical region: Secondary | ICD-10-CM | POA: Diagnosis not present

## 2020-09-15 MED ORDER — OXYCODONE-ACETAMINOPHEN 10-325 MG PO TABS: 1.0000 | ORAL_TABLET | Freq: Three times a day (TID) | ORAL | 0 refills | Status: DC | PRN

## 2020-09-15 NOTE — Telephone Encounter (Signed)
I spent 5 total minutes of online digital evaluation and management services. 

## 2020-09-16 ENCOUNTER — Other Ambulatory Visit: Payer: Self-pay | Admitting: Physician Assistant

## 2020-09-16 DIAGNOSIS — F39 Unspecified mood [affective] disorder: Secondary | ICD-10-CM

## 2020-09-19 ENCOUNTER — Other Ambulatory Visit: Payer: Self-pay | Admitting: Sports Medicine

## 2020-09-19 DIAGNOSIS — M5030 Other cervical disc degeneration, unspecified cervical region: Secondary | ICD-10-CM

## 2020-09-19 NOTE — Telephone Encounter (Signed)
Last OV ; 05/06/20 Last fill: 06/20/2020 #270

## 2020-09-20 ENCOUNTER — Other Ambulatory Visit: Payer: Self-pay | Admitting: Physician Assistant

## 2020-09-20 DIAGNOSIS — M5030 Other cervical disc degeneration, unspecified cervical region: Secondary | ICD-10-CM

## 2020-09-20 NOTE — Telephone Encounter (Signed)
Left msg that refill request was received and filled.

## 2020-10-12 ENCOUNTER — Other Ambulatory Visit: Payer: Self-pay | Admitting: Physician Assistant

## 2020-10-12 DIAGNOSIS — F39 Unspecified mood [affective] disorder: Secondary | ICD-10-CM

## 2020-10-13 ENCOUNTER — Other Ambulatory Visit: Payer: Self-pay | Admitting: Physician Assistant

## 2020-10-13 ENCOUNTER — Encounter: Payer: Self-pay | Admitting: Physician Assistant

## 2020-10-13 MED ORDER — LEVOTHYROXINE SODIUM 50 MCG PO TABS: ORAL_TABLET | ORAL | 0 refills | Status: DC

## 2020-10-16 ENCOUNTER — Other Ambulatory Visit: Payer: Self-pay | Admitting: Physician Assistant

## 2020-10-16 DIAGNOSIS — F39 Unspecified mood [affective] disorder: Secondary | ICD-10-CM

## 2020-10-17 ENCOUNTER — Encounter (INDEPENDENT_AMBULATORY_CARE_PROVIDER_SITE_OTHER): Payer: Managed Care, Other (non HMO)

## 2020-10-17 ENCOUNTER — Other Ambulatory Visit: Payer: Self-pay | Admitting: *Deleted

## 2020-10-17 DIAGNOSIS — M5030 Other cervical disc degeneration, unspecified cervical region: Secondary | ICD-10-CM

## 2020-10-17 DIAGNOSIS — R1031 Right lower quadrant pain: Secondary | ICD-10-CM

## 2020-10-17 MED ORDER — OXYCODONE-ACETAMINOPHEN 5-325 MG PO TABS: 1.0000 | ORAL_TABLET | Freq: Four times a day (QID) | ORAL | 0 refills | Status: AC | PRN

## 2020-10-17 NOTE — Telephone Encounter (Signed)
I spent 5 total minutes of online digital evaluation and management services. 

## 2020-10-24 ENCOUNTER — Other Ambulatory Visit: Payer: Self-pay | Admitting: Physician Assistant

## 2020-10-24 DIAGNOSIS — F39 Unspecified mood [affective] disorder: Secondary | ICD-10-CM

## 2020-10-27 ENCOUNTER — Telehealth: Payer: Self-pay | Admitting: *Deleted

## 2020-10-27 DIAGNOSIS — E039 Hypothyroidism, unspecified: Secondary | ICD-10-CM

## 2020-10-27 NOTE — Telephone Encounter (Signed)
Labs ordered.

## 2020-10-28 ENCOUNTER — Other Ambulatory Visit: Payer: Self-pay | Admitting: Physician Assistant

## 2020-10-28 LAB — TSH: TSH: 2.38 mIU/L

## 2020-10-28 MED ORDER — LEVOTHYROXINE SODIUM 50 MCG PO TABS: ORAL_TABLET | ORAL | 3 refills | Status: DC

## 2020-10-28 NOTE — Progress Notes (Signed)
Thyroid normal. Sent refills for 1 year.

## 2020-11-03 ENCOUNTER — Ambulatory Visit
Admission: RE | Admit: 2020-11-03 | Discharge: 2020-11-03 | Disposition: A | Discharge status: Home / Self Care | Payer: Managed Care, Other (non HMO) | Source: Ambulatory Visit | Attending: Sports Medicine | Admitting: Sports Medicine

## 2020-11-03 ENCOUNTER — Other Ambulatory Visit: Payer: Managed Care, Other (non HMO)

## 2020-11-03 ENCOUNTER — Other Ambulatory Visit: Payer: Self-pay

## 2020-11-03 DIAGNOSIS — M5030 Other cervical disc degeneration, unspecified cervical region: Secondary | ICD-10-CM

## 2020-11-03 MED ORDER — IOPAMIDOL (ISOVUE-M 300) INJECTION 61%
1.0000 mL | Freq: Once | INTRAMUSCULAR | Status: AC | PRN
  Administered 2020-11-03: 1 mL via EPIDURAL

## 2020-11-03 MED ORDER — TRIAMCINOLONE ACETONIDE 40 MG/ML IJ SUSP (RADIOLOGY)
60.0000 mg | Freq: Once | INTRAMUSCULAR | Status: AC
  Administered 2020-11-03: 60 mg via EPIDURAL

## 2020-11-03 NOTE — Discharge Instructions (Signed)

## 2020-12-11 ENCOUNTER — Encounter: Payer: Self-pay | Admitting: Sports Medicine

## 2020-12-11 DIAGNOSIS — M542 Cervicalgia: Secondary | ICD-10-CM

## 2020-12-11 DIAGNOSIS — M5030 Other cervical disc degeneration, unspecified cervical region: Secondary | ICD-10-CM

## 2020-12-16 ENCOUNTER — Other Ambulatory Visit: Payer: Self-pay | Admitting: Sports Medicine

## 2020-12-16 DIAGNOSIS — M5030 Other cervical disc degeneration, unspecified cervical region: Secondary | ICD-10-CM

## 2021-02-03 ENCOUNTER — Encounter: Payer: Self-pay | Admitting: Physician Assistant

## 2021-02-03 ENCOUNTER — Telehealth (INDEPENDENT_AMBULATORY_CARE_PROVIDER_SITE_OTHER): Payer: Managed Care, Other (non HMO) | Admitting: Physician Assistant

## 2021-02-03 ENCOUNTER — Other Ambulatory Visit: Payer: Self-pay

## 2021-02-03 VITALS — BP 131/86 | HR 88 | Temp 97.6°F | Ht 65.0 in | Wt 170.0 lb

## 2021-02-03 DIAGNOSIS — F39 Unspecified mood [affective] disorder: Secondary | ICD-10-CM

## 2021-02-03 DIAGNOSIS — E663 Overweight: Secondary | ICD-10-CM | POA: Diagnosis not present

## 2021-02-03 DIAGNOSIS — K529 Noninfective gastroenteritis and colitis, unspecified: Secondary | ICD-10-CM | POA: Diagnosis not present

## 2021-02-03 DIAGNOSIS — M5030 Other cervical disc degeneration, unspecified cervical region: Secondary | ICD-10-CM | POA: Diagnosis not present

## 2021-02-03 MED ORDER — DICYCLOMINE HCL 10 MG PO CAPS: 10.0000 mg | ORAL_CAPSULE | Freq: Three times a day (TID) | ORAL | 3 refills | Status: DC | PRN

## 2021-02-03 MED ORDER — BUPROPION HCL ER (XL) 300 MG PO TB24: 300.0000 mg | ORAL_TABLET | Freq: Every day | ORAL | 3 refills | Status: DC

## 2021-02-03 MED ORDER — TRAMADOL HCL 50 MG PO TABS: 50.0000 mg | ORAL_TABLET | Freq: Three times a day (TID) | ORAL | 0 refills | Status: DC | PRN

## 2021-02-03 MED ORDER — WEGOVY 0.25 MG/0.5ML ~~LOC~~ SOAJ: 0.2500 mg | SUBCUTANEOUS | 0 refills | Status: DC

## 2021-02-03 NOTE — Progress Notes (Signed)
Wants to discuss weight loss, needs refill on Wellbutrin

## 2021-02-06 DIAGNOSIS — K529 Noninfective gastroenteritis and colitis, unspecified: Secondary | ICD-10-CM | POA: Insufficient documentation

## 2021-02-06 NOTE — Progress Notes (Signed)
..Virtual Visit via Video Note  I connected with Alicia Sanders on 02/06/21 at  3:00 PM EST by a video enabled telemedicine application and verified that I am speaking with the correct person using two identifiers.  Location: Patient: work Provider: clinic  .Marland KitchenParticipating in visit:  Patient: Alicia Sanders Provider:  Iran Planas PA-C    I discussed the limitations of evaluation and management by telemedicine and the availability of in person appointments. The patient expressed understanding and agreed to proceed.  History of Present Illness: Pt is a 41 yo female who needs refills on wellbutrin and tramadol.   She takes wellbutrin for mood and weight control. She continues to be frustated with weight. She needs to lose 15lbs and hopes this would help her back pain. She continues to use tramadol and does well to manage pain.    .. 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   Dysphagia 01/27/2016   Mood disorder ([place]) 01/27/2016   DDD (degenerative disc disease), cervical 01/27/2016   Overweight (BMI 25.0-29.9) 06/25/2016   Other viral warts 06/26/2016   Acute abdominal pain in right lower quadrant 01/28/2017   Mesenteric adenitis 07/24/2017   Abnormal CT of the abdomen 09/23/2017   Nausea 09/23/2017   Hirsutism 03/05/2018   Thyroid disease 01/07/2019   Appendicitis, acute 01/08/2019   Acute appendicitis 01/08/2019   Snoring 06/29/2019   Vitamin D deficiency 07/02/2019   Gastroenteritis 02/06/2021   Resolved Ambulatory Problems    Diagnosis Date Noted   Dermatophytosis of nail 09/04/2007   ANXIETY STATE NOS 10/22/2006   OTITIS MEDIA, SEROUS, ACUTE, RIGHT 12/03/2009   HOT FLASHES 10/16/2006   DERMATITIS 09/07/2009   SKIN RASH 09/04/2007   SYMPTOM, ENLARGEMENT, LYMPH NODES 10/16/2006   OTITIS  EXTERNA 07/11/2010   LOW BACK PAIN, ACUTE 08/03/2010   BACK STRAIN, THORACIC 08/03/2010   Acute maxillary sinusitis 05/08/2012   Preventive measure 05/08/2012   Dysfunction of right eustachian tube 06/03/2012   Trochanteric bursitis of left hip 08/28/2012   Plantar fasciitis, right 04/18/2015   Weight gain 04/18/2015   Acute maxillary sinusitis 10/26/2015   Pleuritic chest pain 01/27/2016   Acute cystitis 10/11/2016   Cough 01/23/2017   Change in bowel habits 09/23/2017   Bloating 03/05/2018   Bilateral lower abdominal discomfort 03/05/2018   No energy 03/05/2018   Past Medical History:  Diagnosis Date   Back problem    Eczema     Observations/Objective: No acute distress  Normal mood and appearance Normal breathing  .Marland Kitchen Today's Vitals   02/03/21 1441  BP: 131/86  Pulse: 88  Temp: 97.6 F (36.4 C)  TempSrc: Oral  Weight: 170 lb (77.1 kg)  Height: 5\' 5"  (1.651 m)   Body mass index is 28.29 kg/m.   .. Depression screen [facility] 2/9 02/03/2021 02/25/2018 09/13/2016  Decreased Interest 0 0 0  Down, Depressed, Hopeless 0 0 1  PHQ - 2 Score 0 0 1  Altered sleeping - 2 1  Tired, decreased energy - 3 1  Change in appetite - 0 0  Feeling bad or failure about yourself  - 0 0  Trouble concentrating - 0 0  Moving slowly or fidgety/restless - 0 0  Suicidal thoughts - 0 0  PHQ-9 Score - 5 3  Difficult doing work/chores - Extremely dIfficult -   .. GAD  7 : Generalized Anxiety Score 02/25/2018  Nervous, Anxious, on Edge 2  Control/stop worrying 1  Worry too much - different things 1  Trouble relaxing 2  Restless 0  Easily annoyed or irritable 1  Afraid - awful might happen 0  Total GAD 7 Score 7  Anxiety Difficulty Very difficult     Assessment and Plan: Marland KitchenMarland KitchenBrionne was seen today for follow-up.  Diagnoses and all orders for this visit:  Overweight (BMI 25.0-29.9) -     WEGOVY 0.25 MG/0.5ML SOAJ; Inject 0.25 mg into the skin once a week. Use this dose for 1 month (4  shots) and then increase to next higher dose.  Gastroenteritis -     dicyclomine (BENTYL) 10 MG capsule; Take 1 capsule (10 mg total) by mouth 3 (three) times daily as needed for spasms.  Mood disorder (HCC) -     buPROPion (WELLBUTRIN XL) 300 MG 24 hr tablet; Take 1 tablet (300 mg total) by mouth daily.  DDD (degenerative disc disease), cervical -     traMADol (ULTRAM) 50 MG tablet; Take 1 tablet (50 mg total) by mouth 3 (three) times daily as needed.   Refilled wellbutrin. Pt doing great on this medication.  No concerns with PHQ/GAD.  Marland Kitchen.PDMP reviewed during this encounter. No concerns refilled tramadol.  Marland Kitchen.Discussed low carb diet with 1500 calories and 80g of protein.  Exercising at least 150 minutes a week.  My Fitness Pal could be a Microbiologist.  [facility] for BMI greater than 27 with DDD and chronic pain.  Discussed side effects and titration up.  Sign up for coupon card.  Follow up in 3 months.      Follow Up Instructions:    I discussed the assessment and treatment plan with the patient. The patient was provided an opportunity to ask questions and all were answered. The patient agreed with the plan and demonstrated an understanding of the instructions.   The patient was advised to call back or seek an in-person evaluation if the symptoms worsen or if the condition fails to improve as anticipated.   Iran Planas, PA-C

## 2021-02-08 ENCOUNTER — Encounter: Payer: Self-pay | Admitting: Physician Assistant

## 2021-02-20 ENCOUNTER — Telehealth: Payer: Self-pay

## 2021-02-20 NOTE — Telephone Encounter (Addendum)
Initiated Prior authorization NLG:[id number] 0.25 MG/0.5ML SOAJ  Via: Covermymeds Case/Key:[id number] Status: denied as of 03/09/21 Reason:plan exclusion Notified Pt via: Mychart

## 2021-02-20 NOTE — Telephone Encounter (Incomplete Revision)
Initiated Prior authorization for:WEGOVY 0.25 MG/0.5ML SOAJ  °Via: Covermymeds °Case/Key:[id number] °Status: denied as of 03/09/21 °Reason:plan exclusion °Notified Pt via: Mychart °

## 2021-03-10 NOTE — Telephone Encounter (Signed)
Please contact patient and let her know.  It is up to her to call her insurance to see what might be covered. ?

## 2021-03-13 NOTE — Telephone Encounter (Signed)
Pt advised to call insurance company and inquire about what medications may be covered for weight loss.  Pt expressed understanding and is agreeable.  Tiajuana Amass, CMA ?

## 2021-03-16 ENCOUNTER — Encounter: Payer: Self-pay | Admitting: Physician Assistant

## 2021-03-17 NOTE — Telephone Encounter (Signed)
Patient needs an appointment.  She can be seen by another provider.  She can go to urgent care.  But she needs to be seen for these prescriptions. ?

## 2021-03-20 ENCOUNTER — Encounter: Payer: Self-pay | Admitting: Physician Assistant

## 2021-03-20 ENCOUNTER — Telehealth: Payer: Managed Care, Other (non HMO) | Admitting: Physician Assistant

## 2021-03-20 VITALS — BP 142/72 | HR 89 | Temp 96.7°F | Ht 65.0 in | Wt 180.0 lb

## 2021-03-20 DIAGNOSIS — M25552 Pain in left hip: Secondary | ICD-10-CM

## 2021-03-20 MED ORDER — OXYCODONE-ACETAMINOPHEN 5-325 MG PO TABS: 1.0000 | ORAL_TABLET | Freq: Three times a day (TID) | ORAL | 0 refills | Status: DC | PRN

## 2021-03-20 MED ORDER — PREDNISONE 50 MG PO TABS: ORAL_TABLET | ORAL | 0 refills | Status: DC

## 2021-03-20 NOTE — Progress Notes (Signed)
..Virtual Visit via Video Note ? ?I connected with Alicia Sanders on 03/20/21 at  8:10 AM EDT by a video enabled telemedicine application and verified that I am speaking with the correct person using two identifiers. ? ?Location: ?Patient: work ?Provider: clinic ? ?Marland Kitchen.Participating in visit:  ?Patient: Alicia Sanders  ?Provider: Tandy Gaw PA-C ?Provider in training: Joyice Faster PA-S ?  ?I discussed the limitations of evaluation and management by telemedicine and the availability of in person appointments. The patient expressed understanding and agreed to proceed. ? ?History of Present Illness: ?Pt is a 41 yo female with lumbar DDD and chronic low back pain that has had left lateral hip pain for 2 months that she describes as severe and "tearing pain". She denies any injury. She denies any pain like this before. No pain at rest only with bearing weight and externally rotating and laying on her left side. Tramadol 3 times a day is not helping. Mobic is not helping. Ice helps some. She is having to work 8-10 hour days on her feet which make it worse. No radiation of pain down legs or into buttocks.  ? ? ?Active Ambulatory Problems  ?  Diagnosis Date Noted  ? Allergic rhinitis 11/26/2007  ? BREAST CYST 01/19/2009  ? Hypothyroidism 07/11/2010  ? DDD (degenerative disc disease), lumbar 05/08/2012  ? 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 Dysphagia 01/27/2016  ? Mood disorder (HCC) 01/27/2016  ? DDD (degenerative disc disease), cervical 01/27/2016  ? Overweight (BMI 25.0-29.9) 06/25/2016  ? Other viral warts 06/26/2016  ? Acute abdominal pain in right lower quadrant 01/28/2017  ? Mesenteric adenitis 07/24/2017  ? Nausea 09/23/2017  ? Hirsutism 03/05/2018  ? Thyroid disease 01/07/2019  ? Appendicitis, acute 01/08/2019  ? Acute appendicitis 01/08/2019  ? Snoring 06/29/2019  ? Vitamin D deficiency 07/02/2019  ? Gastroenteritis 02/06/2021  ? ?Resolved Ambulatory Problems  ?  Diagnosis Date Noted  ? Dermatophytosis of nail 09/04/2007   ? ANXIETY STATE NOS 10/22/2006  ? OTITIS MEDIA, SEROUS, ACUTE, RIGHT 12/03/2009  ? HOT FLASHES 10/16/2006  ? DERMATITIS 09/07/2009  ? SKIN RASH 09/04/2007  ? SYMPTOM, ENLARGEMENT, LYMPH NODES 10/16/2006  ? OTITIS EXTERNA 07/11/2010  ? LOW BACK PAIN, ACUTE 08/03/2010  ? BACK STRAIN, THORACIC 08/03/2010  ? Acute maxillary sinusitis 05/08/2012  ? Preventive measure 05/08/2012  ? Dysfunction of right eustachian tube 06/03/2012  ? Trochanteric bursitis of left hip 08/28/2012  ? Plantar fasciitis, right 04/18/2015  ? Weight gain 04/18/2015  ? Acute maxillary sinusitis 10/26/2015  ? Pleuritic chest pain 01/27/2016  ? Acute cystitis 10/11/2016  ? Cough 01/23/2017  ? Abnormal CT of the abdomen 09/23/2017  ? Change in bowel habits 09/23/2017  ? Bloating 03/05/2018  ? Bilateral lower abdominal discomfort 03/05/2018  ? No energy 03/05/2018  ? ?Past Medical History:  ?Diagnosis Date  ? Back problem   ? Eczema   ? ? ? ?  ?Observations/Objective: ?No acute distress ?Normal mood and appearance ? ? ? ?Assessment and Plan: ?..Cherika was seen today for hip pain. ? ?Diagnoses and all orders for this visit: ? ?Left hip pain ?-     predniSONE (DELTASONE) 50 MG tablet; Take one tablet for 5 days. ?-     oxyCODONE-acetaminophen (PERCOCET/ROXICET) 5-325 MG tablet; Take 1 tablet by mouth every 8 (eight) hours as needed. For break through pain. ?-     DG Hip Unilat W OR W/O Pelvis 2-3 Views Left; Future ? ?Likely bursitis ?Prednisone for 5 days.  ?Xray ordered ?Marland Kitchen.PDMP reviewed  during this encounter. ?Does not tolerate norco. ?Sent oxycodone. Aware not to take with tramadol. Only use for breakthrough pain.  ?Continue rest, ice, exercises.  ?If not improving come in to see Dr. Karie Schwalbe.  ? ?Follow Up Instructions: ? ?  ?I discussed the assessment and treatment plan with the patient. The patient was provided an opportunity to ask questions and all were answered. The patient agreed with the plan and demonstrated an understanding of the instructions. ?   ?The patient was advised to call back or seek an in-person evaluation if the symptoms worsen or if the condition fails to improve as anticipated. ? ? ?Tandy Gaw, PA-C ? ?

## 2021-03-20 NOTE — Progress Notes (Signed)
Started 2 months ago, getting worse ?Left hip ?Feels like every time she stands something is ripping in her hip ?Has tried heat, ice, meloxicam, tramadol (uses for neck/back), biofreeze cream - nothing helps ?No known injury ?

## 2021-03-30 ENCOUNTER — Encounter: Payer: Self-pay | Admitting: Physician Assistant

## 2021-03-30 DIAGNOSIS — M25552 Pain in left hip: Secondary | ICD-10-CM

## 2021-04-03 MED ORDER — METHYLPREDNISOLONE 4 MG PO TBPK: ORAL_TABLET | ORAL | 0 refills | Status: DC

## 2021-04-03 MED ORDER — OXYCODONE-ACETAMINOPHEN 5-325 MG PO TABS: 1.0000 | ORAL_TABLET | Freq: Three times a day (TID) | ORAL | 0 refills | Status: DC | PRN

## 2021-04-03 NOTE — Telephone Encounter (Signed)
..  PDMP reviewed during this encounter. ?No concerns.  ?Sent medrol dose pack and percocet ?

## 2021-04-12 ENCOUNTER — Ambulatory Visit
Admission: RE | Admit: 2021-04-12 | Discharge: 2021-04-12 | Disposition: A | Discharge status: Home / Self Care | Payer: Managed Care, Other (non HMO) | Source: Ambulatory Visit | Attending: Physician Assistant | Admitting: Physician Assistant

## 2021-04-12 DIAGNOSIS — M25552 Pain in left hip: Secondary | ICD-10-CM

## 2021-04-13 ENCOUNTER — Encounter: Payer: Self-pay | Admitting: Physician Assistant

## 2021-04-13 NOTE — Progress Notes (Signed)
Arthritis changes of the left hip.

## 2021-04-17 ENCOUNTER — Ambulatory Visit
Admission: RE | Admit: 2021-04-17 | Discharge: 2021-04-17 | Disposition: A | Discharge status: Home / Self Care | Payer: Managed Care, Other (non HMO) | Source: Ambulatory Visit | Attending: Sports Medicine | Admitting: Sports Medicine

## 2021-04-17 ENCOUNTER — Encounter: Payer: Self-pay | Admitting: Sports Medicine

## 2021-04-17 DIAGNOSIS — M5030 Other cervical disc degeneration, unspecified cervical region: Secondary | ICD-10-CM

## 2021-04-17 DIAGNOSIS — M542 Cervicalgia: Secondary | ICD-10-CM

## 2021-04-19 ENCOUNTER — Ambulatory Visit: Payer: Managed Care, Other (non HMO) | Admitting: Sports Medicine

## 2021-04-19 ENCOUNTER — Ambulatory Visit (INDEPENDENT_AMBULATORY_CARE_PROVIDER_SITE_OTHER): Payer: Managed Care, Other (non HMO)

## 2021-04-19 DIAGNOSIS — M7062 Trochanteric bursitis, left hip: Secondary | ICD-10-CM

## 2021-04-19 DIAGNOSIS — M25552 Pain in left hip: Secondary | ICD-10-CM

## 2021-04-19 NOTE — Assessment & Plan Note (Signed)
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 ?Recurrence of pain, repeat injection today, hip conditioning recommended, return to see me as needed. ?

## 2021-04-19 NOTE — Progress Notes (Signed)
? ? ?  Procedures performed today:   ? ?Procedure: Real-time Ultrasound Guided injection of the left greater trochanteric bursa ?Device: Samsung HS60  ?Verbal informed consent obtained.  ?Time-out conducted.  ?Noted no overlying erythema, induration, or other signs of local infection.  ?Skin prepped in a sterile fashion.  ?Local anesthesia: Topical Ethyl chloride.  ?With sterile technique and under real time ultrasound guidance: Noted normal-appearing bursa, 1 cc Kenalog 40, 2 cc lidocaine, 2 cc bupivacaine injected easily ?Completed without difficulty  ?Advised to call if fevers/chills, erythema, induration, drainage, or persistent bleeding.  ?Images permanently stored and available for review in PACS.  ?Impression: Technically successful ultrasound guided injection. ? ?Independent interpretation of notes and tests performed by another provider:  ? ?None. ? ?Brief History, Exam, Impression, and Recommendations:   ? 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 ?Recurrence of pain, repeat injection today, hip conditioning recommended, return to see me as needed. ? ? ? ?___________________________________________ ?Gwen Her. Dianah Field, M.D., ABFM., CAQSM. ?Primary Care and Sports Medicine ?[place] ? ?Adjunct Instructor of Family Medicine  ?[employer] ?

## 2021-04-27 ENCOUNTER — Encounter: Payer: Self-pay | Admitting: Physician Assistant

## 2021-04-27 DIAGNOSIS — K529 Noninfective gastroenteritis and colitis, unspecified: Secondary | ICD-10-CM

## 2021-04-27 MED ORDER — DICYCLOMINE HCL 10 MG PO CAPS: 10.0000 mg | ORAL_CAPSULE | Freq: Three times a day (TID) | ORAL | 0 refills | Status: DC | PRN

## 2021-04-27 NOTE — Addendum Note (Signed)
Addended bySilvio Pate on: 04/27/2021 11:51 AM ? ? Modules accepted: Orders ? ?

## 2021-04-28 ENCOUNTER — Encounter: Payer: Self-pay | Admitting: Sports Medicine

## 2021-05-04 ENCOUNTER — Ambulatory Visit
Admission: RE | Admit: 2021-05-04 | Discharge: 2021-05-04 | Disposition: A | Discharge status: Home / Self Care | Payer: Managed Care, Other (non HMO) | Source: Ambulatory Visit | Attending: Sports Medicine | Admitting: Sports Medicine

## 2021-05-04 DIAGNOSIS — M5030 Other cervical disc degeneration, unspecified cervical region: Secondary | ICD-10-CM

## 2021-05-04 MED ORDER — IOPAMIDOL (ISOVUE-M 200) INJECTION 41%
1.0000 mL | Freq: Once | INTRAMUSCULAR | Status: AC
  Administered 2021-05-04: 1 mL via EPIDURAL

## 2021-05-04 MED ORDER — METHYLPREDNISOLONE ACETATE 40 MG/ML INJ SUSP (RADIOLOG
80.0000 mg | Freq: Once | INTRAMUSCULAR | Status: AC
  Administered 2021-05-04: 80 mg via EPIDURAL

## 2021-05-04 NOTE — Discharge Instructions (Signed)

## 2021-05-20 ENCOUNTER — Other Ambulatory Visit: Payer: Self-pay | Admitting: Physician Assistant

## 2021-05-20 DIAGNOSIS — K529 Noninfective gastroenteritis and colitis, unspecified: Secondary | ICD-10-CM

## 2021-05-22 ENCOUNTER — Ambulatory Visit: Payer: Managed Care, Other (non HMO) | Admitting: Physician Assistant

## 2021-05-22 ENCOUNTER — Encounter: Payer: Self-pay | Admitting: Physician Assistant

## 2021-05-22 VITALS — BP 161/95 | HR 122 | Ht 65.0 in | Wt 184.0 lb

## 2021-05-22 DIAGNOSIS — E559 Vitamin D deficiency, unspecified: Secondary | ICD-10-CM | POA: Diagnosis not present

## 2021-05-22 DIAGNOSIS — Z13220 Encounter for screening for lipoid disorders: Secondary | ICD-10-CM

## 2021-05-22 DIAGNOSIS — Z79899 Other long term (current) drug therapy: Secondary | ICD-10-CM

## 2021-05-22 DIAGNOSIS — Z23 Encounter for immunization: Secondary | ICD-10-CM | POA: Diagnosis not present

## 2021-05-22 DIAGNOSIS — Z131 Encounter for screening for diabetes mellitus: Secondary | ICD-10-CM | POA: Diagnosis not present

## 2021-05-22 DIAGNOSIS — Z566 Other physical and mental strain related to work: Secondary | ICD-10-CM

## 2021-05-22 DIAGNOSIS — G43011 Migraine without aura, intractable, with status migrainosus: Secondary | ICD-10-CM

## 2021-05-22 DIAGNOSIS — R030 Elevated blood-pressure reading, without diagnosis of hypertension: Secondary | ICD-10-CM

## 2021-05-22 MED ORDER — SUMATRIPTAN SUCCINATE 100 MG PO TABS: 100.0000 mg | ORAL_TABLET | ORAL | 1 refills | Status: DC | PRN

## 2021-05-22 MED ORDER — HYDROCHLOROTHIAZIDE 12.5 MG PO TABS: 12.5000 mg | ORAL_TABLET | Freq: Every day | ORAL | 1 refills | Status: DC

## 2021-05-22 NOTE — Progress Notes (Signed)
   Acute Office Visit  Subjective:     Patient ID: ARPI DIEBOLD, female    DOB: [birth year], 41 y.o.   MRN: [id number]  Chief Complaint  Patient presents with  . Migraine    HPI Patient is in today for ***  Rescue imitrex helps 4 since April   Calcium score [facility]  ???calcium score last week older lady Jundy     ROS      Objective:    BP (!) 161/95   Pulse (!) 122   Ht 5\' 5"  (1.651 m)   Wt 184 lb (83.5 kg)   SpO2 99%   BMI 30.62 kg/m  BP Readings from Last 3 Encounters:  05/22/21 (!) 161/95  05/04/21 (!) 161/105  03/20/21 (!) 142/72      Physical Exam        Assessment & Plan:   Problem List Items Addressed This Visit       Unprioritized   Vitamin D deficiency - Primary   Relevant Orders   VITAMIN D 25 Hydroxy (Vit-D Deficiency, Fractures)   Other Visit Diagnoses     Screening for lipid disorders       Relevant Orders   Lipid Panel w/reflex Direct LDL   Screening for diabetes mellitus       Relevant Orders   COMPLETE METABOLIC PANEL WITH GFR   Medication management       Relevant Orders   Lipid Panel w/reflex Direct LDL   COMPLETE METABOLIC PANEL WITH GFR   CBC with Differential/Platelet   VITAMIN D 25 Hydroxy (Vit-D Deficiency, Fractures)       No orders of the defined types were placed in this encounter.   No follow-ups on file.  03/22/21, PA-C

## 2021-05-22 NOTE — Patient Instructions (Signed)
Migraine Headache A migraine headache is an intense, throbbing pain on one side or both sides of the head. Migraine headaches may also cause other symptoms, such as nausea, vomiting, and sensitivity to light and noise. A migraine headache can last from 4 hours to 3 days. Talk with your doctor about what things may bring on (trigger) your migraine headaches. What are the causes? The exact cause of this condition is not known. However, a migraine may be caused when nerves in the brain become irritated and release chemicals that cause inflammation of blood vessels. This inflammation causes pain. This condition may be triggered or caused by: Drinking alcohol. Smoking. Taking medicines, such as: Medicine used to treat chest pain (nitroglycerin). Birth control pills. Estrogen. Certain blood pressure medicines. Eating or drinking products that contain nitrates, glutamate, aspartame, or tyramine. Aged cheeses, chocolate, or caffeine may also be triggers. Doing physical activity. Other things that may trigger a migraine headache include: Menstruation. Pregnancy. Hunger. Stress. Lack of sleep or too much sleep. Weather changes. Fatigue. What increases the risk? The following factors may make you more likely to experience migraine headaches: Being a certain age. This condition is more common in people who are 25-55 years old. Being female. Having a family history of migraine headaches. Being Caucasian. Having a mental health condition, such as depression or anxiety. Being obese. What are the signs or symptoms? The main symptom of this condition is pulsating or throbbing pain. This pain may: Happen in any area of the head, such as on one side or both sides. Interfere with daily activities. Get worse with physical activity. Get worse with exposure to bright lights or loud noises. Other symptoms may include: Nausea. Vomiting. Dizziness. General sensitivity to bright lights, loud noises, or  smells. Before you get a migraine headache, you may get warning signs (an aura). An aura may include: Seeing flashing lights or having blind spots. Seeing bright spots, halos, or zigzag lines. Having tunnel vision or blurred vision. Having numbness or a tingling feeling. Having trouble talking. Having muscle weakness. Some people have symptoms after a migraine headache (postdromal phase), such as: Feeling tired. Difficulty concentrating. How is this diagnosed? A migraine headache can be diagnosed based on: Your symptoms. A physical exam. Tests, such as: CT scan or an MRI of the head. These imaging tests can help rule out other causes of headaches. Taking fluid from the spine (lumbar puncture) and analyzing it (cerebrospinal fluid analysis, or CSF analysis). How is this treated? This condition may be treated with medicines that: Relieve pain. Relieve nausea. Prevent migraine headaches. Treatment for this condition may also include: Acupuncture. Lifestyle changes like avoiding foods that trigger migraine headaches. Biofeedback. Cognitive behavioral therapy. Follow these instructions at home: Medicines Take over-the-counter and prescription medicines only as told by your health care provider. Ask your health care provider if the medicine prescribed to you: Requires you to avoid driving or using heavy machinery. Can cause constipation. You may need to take these actions to prevent or treat constipation: Drink enough fluid to keep your urine pale yellow. Take over-the-counter or prescription medicines. Eat foods that are high in fiber, such as beans, whole grains, and fresh fruits and vegetables. Limit foods that are high in fat and processed sugars, such as fried or sweet foods. Lifestyle Do not drink alcohol. Do not use any products that contain nicotine or tobacco, such as cigarettes, e-cigarettes, and chewing tobacco. If you need help quitting, ask your health care  provider. Get at least 8   hours of sleep every night. Find ways to manage stress, such as meditation, deep breathing, or yoga. General instructions     Keep a journal to find out what may trigger your migraine headaches. For example, write down: What you eat and drink. How much sleep you get. Any change to your diet or medicines. If you have a migraine headache: Avoid things that make your symptoms worse, such as bright lights. It may help to lie down in a dark, quiet room. Do not drive or use heavy machinery. Ask your health care provider what activities are safe for you while you are experiencing symptoms. Keep all follow-up visits as told by your health care provider. This is important. Contact a health care provider if: You develop symptoms that are different or more severe than your usual migraine headache symptoms. You have more than 15 headache days in one month. Get help right away if: Your migraine headache becomes severe. Your migraine headache lasts longer than 72 hours. You have a fever. You have a stiff neck. You have vision loss. Your muscles feel weak or like you cannot control them. You start to lose your balance often. You have trouble walking. You faint. You have a seizure. Summary A migraine headache is an intense, throbbing pain on one side or both sides of the head. Migraines may also cause other symptoms, such as nausea, vomiting, and sensitivity to light and noise. This condition may be treated with medicines and lifestyle changes. You may also need to avoid certain things that trigger a migraine headache. Keep a journal to find out what may trigger your migraine headaches. Contact your health care provider if you have more than 15 headache days in a month or you develop symptoms that are different or more severe than your usual migraine headache symptoms. This information is not intended to replace advice given to you by your health care provider. Make sure  you discuss any questions you have with your health care provider. Document Revised: 04/11/2018 Document Reviewed: 01/30/2018 Elsevier Patient Education  2023 [employer]  

## 2021-05-23 DIAGNOSIS — Z566 Other physical and mental strain related to work: Secondary | ICD-10-CM | POA: Insufficient documentation

## 2021-05-23 DIAGNOSIS — G43011 Migraine without aura, intractable, with status migrainosus: Secondary | ICD-10-CM | POA: Insufficient documentation

## 2021-05-23 DIAGNOSIS — R030 Elevated blood-pressure reading, without diagnosis of hypertension: Secondary | ICD-10-CM | POA: Insufficient documentation

## 2021-06-02 ENCOUNTER — Encounter: Payer: Self-pay | Admitting: Physician Assistant

## 2021-06-02 MED ORDER — NURTEC 75 MG PO TBDP: 1.0000 | ORAL_TABLET | ORAL | 5 refills | Status: AC | PRN

## 2021-06-13 ENCOUNTER — Telehealth: Payer: Self-pay

## 2021-06-13 NOTE — Telephone Encounter (Addendum)
Initiated Prior authorization for: Nurtec 75 mg Via: Covermymeds Case/Key:[id number] Status: approved as of 06/13/2021 Reason:Coverage Start Date:06/13/2021;Coverage End Date:06/13/2022 Notified Pt via: Mychart

## 2021-06-14 ENCOUNTER — Other Ambulatory Visit: Payer: Self-pay | Admitting: Physician Assistant

## 2021-06-14 DIAGNOSIS — K529 Noninfective gastroenteritis and colitis, unspecified: Secondary | ICD-10-CM

## 2021-06-21 ENCOUNTER — Encounter: Payer: Self-pay | Admitting: Physician Assistant

## 2021-06-22 MED ORDER — HYDROCHLOROTHIAZIDE 25 MG PO TABS: 25.0000 mg | ORAL_TABLET | Freq: Every day | ORAL | 1 refills | Status: DC

## 2021-06-26 ENCOUNTER — Other Ambulatory Visit: Payer: Self-pay | Admitting: Physician Assistant

## 2021-06-26 DIAGNOSIS — M5030 Other cervical disc degeneration, unspecified cervical region: Secondary | ICD-10-CM

## 2021-07-17 ENCOUNTER — Other Ambulatory Visit: Payer: Self-pay | Admitting: Physician Assistant

## 2021-07-17 DIAGNOSIS — K529 Noninfective gastroenteritis and colitis, unspecified: Secondary | ICD-10-CM

## 2021-07-18 ENCOUNTER — Encounter: Payer: Self-pay | Admitting: Physician Assistant

## 2021-07-18 MED ORDER — CLINDAMYCIN PHOS-BENZOYL PEROX 1.2-5 % EX GEL: 1.0000 | Freq: Two times a day (BID) | CUTANEOUS | 0 refills | Status: DC

## 2021-08-02 ENCOUNTER — Encounter: Payer: Self-pay | Admitting: Neurology

## 2021-08-09 ENCOUNTER — Other Ambulatory Visit: Payer: Self-pay | Admitting: Physician Assistant

## 2021-08-09 DIAGNOSIS — K529 Noninfective gastroenteritis and colitis, unspecified: Secondary | ICD-10-CM

## 2021-08-10 ENCOUNTER — Other Ambulatory Visit: Payer: Self-pay | Admitting: Physician Assistant

## 2021-08-10 ENCOUNTER — Ambulatory Visit
Admission: RE | Admit: 2021-08-10 | Discharge: 2021-08-10 | Disposition: A | Discharge status: Home / Self Care | Payer: Managed Care, Other (non HMO) | Source: Ambulatory Visit | Attending: Physician Assistant | Admitting: Physician Assistant

## 2021-08-10 DIAGNOSIS — Z1231 Encounter for screening mammogram for malignant neoplasm of breast: Secondary | ICD-10-CM

## 2021-08-16 ENCOUNTER — Encounter: Payer: Self-pay | Admitting: Physician Assistant

## 2021-08-17 ENCOUNTER — Other Ambulatory Visit: Payer: Self-pay | Admitting: Physician Assistant

## 2021-08-17 DIAGNOSIS — K529 Noninfective gastroenteritis and colitis, unspecified: Secondary | ICD-10-CM

## 2021-08-17 NOTE — Progress Notes (Signed)
Normal mammogram. Follow up in 1 year.

## 2021-08-22 ENCOUNTER — Encounter: Payer: Self-pay | Admitting: Physician Assistant

## 2021-08-23 ENCOUNTER — Other Ambulatory Visit: Payer: Self-pay | Admitting: Physician Assistant

## 2021-08-23 DIAGNOSIS — M25552 Pain in left hip: Secondary | ICD-10-CM

## 2021-08-23 MED ORDER — OXYCODONE-ACETAMINOPHEN 5-325 MG PO TABS: 1.0000 | ORAL_TABLET | Freq: Three times a day (TID) | ORAL | 0 refills | Status: DC | PRN

## 2021-08-23 MED ORDER — PREDNISONE 50 MG PO TABS: ORAL_TABLET | ORAL | 0 refills | Status: DC

## 2021-08-23 NOTE — Progress Notes (Signed)
Tried to review PMP.  Sent prednisone and a few percocet.

## 2021-09-07 ENCOUNTER — Other Ambulatory Visit: Payer: Self-pay | Admitting: Physician Assistant

## 2021-09-19 ENCOUNTER — Ambulatory Visit
Admission: RE | Admit: 2021-09-19 | Discharge: 2021-09-19 | Disposition: A | Discharge status: Home / Self Care | Payer: No Typology Code available for payment source | Source: Ambulatory Visit | Attending: Physician Assistant | Admitting: Physician Assistant

## 2021-09-19 DIAGNOSIS — R030 Elevated blood-pressure reading, without diagnosis of hypertension: Secondary | ICD-10-CM

## 2021-09-20 ENCOUNTER — Other Ambulatory Visit: Payer: Self-pay

## 2021-09-20 DIAGNOSIS — Z13220 Encounter for screening for lipoid disorders: Secondary | ICD-10-CM

## 2021-09-20 NOTE — Progress Notes (Signed)
Calcium score is not 0 which is optimal but only a mildly increase risk. I would like to see your lipid levels as well if you can get those drawn.

## 2021-09-21 ENCOUNTER — Encounter: Payer: Self-pay | Admitting: Physician Assistant

## 2021-09-22 ENCOUNTER — Other Ambulatory Visit: Payer: Self-pay | Admitting: Physician Assistant

## 2021-09-22 DIAGNOSIS — E039 Hypothyroidism, unspecified: Secondary | ICD-10-CM

## 2021-09-22 DIAGNOSIS — Z13220 Encounter for screening for lipoid disorders: Secondary | ICD-10-CM

## 2021-09-22 DIAGNOSIS — Z131 Encounter for screening for diabetes mellitus: Secondary | ICD-10-CM

## 2021-09-22 DIAGNOSIS — Z79899 Other long term (current) drug therapy: Secondary | ICD-10-CM

## 2021-09-22 DIAGNOSIS — Z0000 Encounter for general adult medical examination without abnormal findings: Secondary | ICD-10-CM

## 2021-09-22 NOTE — Progress Notes (Signed)
Labs ordered.

## 2021-10-04 ENCOUNTER — Encounter: Payer: Self-pay | Admitting: Physician Assistant

## 2021-10-04 ENCOUNTER — Ambulatory Visit (INDEPENDENT_AMBULATORY_CARE_PROVIDER_SITE_OTHER): Payer: Managed Care, Other (non HMO) | Admitting: Physician Assistant

## 2021-10-04 VITALS — BP 148/91 | HR 96 | Ht 65.0 in | Wt 179.0 lb

## 2021-10-04 DIAGNOSIS — K580 Irritable bowel syndrome with diarrhea: Secondary | ICD-10-CM

## 2021-10-04 DIAGNOSIS — Z0000 Encounter for general adult medical examination without abnormal findings: Secondary | ICD-10-CM | POA: Diagnosis not present

## 2021-10-04 DIAGNOSIS — E039 Hypothyroidism, unspecified: Secondary | ICD-10-CM

## 2021-10-04 DIAGNOSIS — Z23 Encounter for immunization: Secondary | ICD-10-CM | POA: Diagnosis not present

## 2021-10-04 DIAGNOSIS — I10 Essential (primary) hypertension: Secondary | ICD-10-CM

## 2021-10-04 MED ORDER — LOSARTAN POTASSIUM 25 MG PO TABS: 25.0000 mg | ORAL_TABLET | Freq: Every day | ORAL | 0 refills | Status: DC

## 2021-10-04 MED ORDER — DICYCLOMINE HCL 10 MG PO CAPS: ORAL_CAPSULE | ORAL | 2 refills | Status: DC

## 2021-10-04 MED ORDER — HYDROCHLOROTHIAZIDE 25 MG PO TABS: 25.0000 mg | ORAL_TABLET | Freq: Every day | ORAL | 1 refills | Status: DC

## 2021-10-04 NOTE — Progress Notes (Signed)
Complete physical exam  Patient: Alicia Sanders   DOB: 1980/09/28   40 y.o. Female  MRN: [id number]  Subjective:      Alicia Sanders is a 41 y.o. female who presents today for a complete physical exam. She reports consuming a general diet. The patient has a physically strenuous job, but has no regular exercise apart from work.  She generally feels well. She reports sleeping well. She does not have additional problems to discuss today.    Most recent fall risk assessment:    10/04/2021    9:42 AM  Fall Risk   Falls in the past year? 0  Number falls in past yr: 0  Injury with Fall? 0  Risk for fall due to : No Fall Risks  Follow up Falls evaluation completed     Most recent depression screenings:    10/04/2021    9:57 AM 05/22/2021    4:12 PM  PHQ 2/9 Scores  PHQ - 2 Score 0 0  PHQ- 9 Score 0     Vision:Within last year and Dental: No current dental problems  Patient Active Problem List   Diagnosis Date Noted   Primary hypertension 10/04/2021   Elevated blood pressure reading 05/23/2021   Intractable migraine without aura and with status migrainosus 05/23/2021   Stress at work 05/23/2021   Gastroenteritis 02/06/2021   Vitamin D deficiency 07/02/2019   Snoring 06/29/2019   Appendicitis, acute 01/08/2019   Acute appendicitis 01/08/2019   Thyroid disease 01/07/2019   Hirsutism 03/05/2018   Nausea 09/23/2017   Mesenteric adenitis 07/24/2017   Acute abdominal pain in right lower quadrant 01/28/2017   Other viral warts 06/26/2016   Overweight (BMI 25.0-29.9) 06/25/2016   Dysphagia 01/27/2016   Mood disorder (HCC) 01/27/2016   DDD (degenerative disc disease), cervical 01/27/2016   Dyshidrotic eczema 09/01/2015   Trochanteric bursitis, left hip 08/28/2012   DDD (degenerative disc disease), lumbar 05/08/2012   Hypothyroidism 07/11/2010   BREAST CYST 01/19/2009   Allergic rhinitis 11/26/2007   Past Medical History:  Diagnosis Date   Back problem     Bilateral lower abdominal discomfort 03/05/2018   Bloating 03/05/2018   Change in bowel habits 09/23/2017   Cough 01/23/2017   Eczema    Hypothyroidism    No energy 03/05/2018   Pleuritic chest pain 01/27/2016   Preventive measure 05/08/2012   Weight gain 04/18/2015   Family History  Problem Relation Age of Onset   Endometrial cancer Mother    Heart disease Father    Diabetes Maternal Grandmother    Lung cancer Maternal Grandfather    Heart disease Paternal Grandfather    Heart disease Brother    Colon cancer Neg Hx    Esophageal cancer Neg Hx    Inflammatory bowel disease Neg Hx    Liver disease Neg Hx    Pancreatic cancer Neg Hx    Rectal cancer Neg Hx    Stomach cancer Neg Hx    Breast cancer Neg Hx    Allergies  Allergen Reactions   Thimerosal (Thiomersal) Swelling and Other (See Comments)    Blisters and swelling at injection site; significant malaise.  CAN tolerate preservative-free flu vaccine (10/24/16)   Adhesive [Tape] Other (See Comments)    Blister and redness/ longer tears skin   Hydrocodone Itching      Patient Care Team: Nolene Ebbs as PCP - General (Family Medicine)   Outpatient Medications Prior to Visit  Medication Sig   albuterol (  PROVENTIL HFA;VENTOLIN HFA) 108 (90 Base) MCG/ACT inhaler 2 puffs 15 minutes before physical activity and exercise   buPROPion (WELLBUTRIN XL) 300 MG 24 hr tablet Take 1 tablet (300 mg total) by mouth daily.   Clindamycin-Benzoyl Per, Refr, gel APPLY TO AFFECTED AREA TWICE A DAY   levothyroxine (SYNTHROID) 50 MCG tablet TAKE 1 TABLET BY MOUTH EVERY DAY.   montelukast (SINGULAIR) 10 MG tablet TAKE 1 TABLET BY MOUTH EVERYDAY AT BEDTIME (Patient taking differently: Take 10 mg by mouth at bedtime.)   ondansetron (ZOFRAN-ODT) 8 MG disintegrating tablet Take 1 tablet (8 mg total) by mouth every 8 (eight) hours as needed for nausea.   oxyCODONE-acetaminophen (PERCOCET/ROXICET) 5-325 MG tablet Take 1 tablet by mouth every 8 (eight)  hours as needed. For break through pain.   promethazine (PHENERGAN) 25 MG tablet Take 1 tablet (25 mg total) by mouth every 6 (six) hours as needed for nausea or vomiting.   Rimegepant Sulfate (NURTEC) 75 MG TBDP Take 1 tablet by mouth as needed. No more than one tablet in 24 hour period.   SUMAtriptan (IMITREX) 100 MG tablet Take 1 tablet (100 mg total) by mouth every 2 (two) hours as needed for migraine. May repeat in 2 hours if headache persists or recurs.   traMADol (ULTRAM) 50 MG tablet TAKE 1 TABLET BY MOUTH 3 TIMES DAILY AS NEEDED.   tretinoin (RETIN-A) 0.05 % cream Apply 1 application topically daily as needed (ezma).    [DISCONTINUED] dicyclomine (BENTYL) 10 MG capsule TAKE 1 CAPSULE (10 MG) BY MOUTH 3 (THREE) TIMES DAILY AS NEEDED FOR SPASMS. APPT FOR FURTHER REFILLS   [DISCONTINUED] hydrochlorothiazide (HYDRODIURIL) 25 MG tablet Take 1 tablet (25 mg total) by mouth daily.   [DISCONTINUED] predniSONE (DELTASONE) 50 MG tablet One tab PO daily for 5 days.   No facility-administered medications prior to visit.    Review of Systems  All other systems reviewed and are negative.   Objective:     BP (!) 148/91   Pulse 96   Ht 5\' 5"  (1.651 m)   Wt 179 lb (81.2 kg)   SpO2 100%   BMI 29.79 kg/m  BP Readings from Last 3 Encounters:  10/04/21 (!) 148/91  05/22/21 (!) 161/95  05/04/21 (!) 161/105   Wt Readings from Last 3 Encounters:  10/04/21 179 lb (81.2 kg)  05/22/21 184 lb (83.5 kg)  03/20/21 180 lb (81.6 kg)      Physical Exam  BP (!) 148/91   Pulse 96   Ht 5\' 5"  (1.651 m)   Wt 179 lb (81.2 kg)   SpO2 100%   BMI 29.79 kg/m   General Appearance:    Alert, cooperative, no distress, appears stated age  Head:    Normocephalic, without obvious abnormality, atraumatic  Eyes:    PERRL, conjunctiva/corneas clear, EOM's intact, fundi    benign, both eyes  Ears:    Normal TM's and external ear canals, both ears  Nose:   Nares normal, septum midline, mucosa normal, no  drainage    or sinus tenderness  Throat:   Lips, mucosa, and tongue normal; teeth and gums normal  Neck:   Supple, symmetrical, trachea midline, no adenopathy;    thyroid:  no enlargement/tenderness/nodules; no carotid   bruit or JVD  Back:     Symmetric, no curvature, ROM normal, no CVA tenderness  Lungs:     Clear to auscultation bilaterally, respirations unlabored  Chest Wall:    No tenderness or deformity   Heart:  Regular rate and rhythm, S1 and S2 normal, no murmur, rub   or gallop     Abdomen:     Soft, non-tender, bowel sounds active all four quadrants,    no masses, no organomegaly        Extremities:   Extremities normal, atraumatic, no cyanosis or edema  Pulses:   2+ and symmetric all extremities  Skin:   Skin color, texture, turgor normal, no rashes or lesions  Lymph nodes:   Cervical, supraclavicular, and axillary nodes normal  Neurologic:   CNII-XII intact, normal strength, sensation and reflexes    throughout      Assessment & Plan:    Routine Health Maintenance and Physical Exam  Immunization History  Administered Date(s) Administered   H1N1 11/26/2007   Influenza Inj Mdck Quad Pf 10/29/2018, 10/04/2021   Influenza,inj,Quad PF,6+ Mos 12/13/2020   Influenza-Unspecified 10/30/2016, 10/29/2018   PFIZER(Purple Top)SARS-COV-2 Vaccination 02/12/2019, 03/09/2019   Tdap 05/22/2021    Health Maintenance  Topic Date Due   HIV Screening  Never done   COVID-19 Vaccine (3 - Pfizer risk series) 10/20/2021 (Originally 04/06/2019)   PAP SMEAR-Modifier  11/02/2023   TETANUS/TDAP  05/23/2031   INFLUENZA VACCINE  Completed   Hepatitis C Screening  Completed   HPV VACCINES  Aged Out    Discussed health benefits of physical activity, and encouraged her to engage in regular exercise appropriate for her age and condition.  Marland KitchenWells Guiles was seen today for annual exam.  Diagnoses and all orders for this visit:  Preventative health care  Irritable bowel syndrome with  diarrhea -     dicyclomine (BENTYL) 10 MG capsule; TAKE 1 CAPSULE (10 MG) BY MOUTH 3 (THREE) TIMES DAILY AS NEEDED FOR SPASMS.  Flu vaccine need -     Flu Vaccine MDCK QUAD PF  Hypothyroidism, unspecified type  Primary hypertension -     losartan (COZAAR) 25 MG tablet; Take 1 tablet (25 mg total) by mouth daily. -     hydrochlorothiazide (HYDRODIURIL) 25 MG tablet; Take 1 tablet (25 mg total) by mouth daily.  Other orders -     Cancel: Flu vaccine, recombinant, quadrivalent, inj (Flublok quad egg free)   .Marland Kitchen Discussed 150 minutes of exercise a week.  Encouraged vitamin D 1000 units and Calcium 1300mg  or 4 servings of dairy a day.  Fasting labs ordered PHQ no concerns Pap and mammogram with GYN Flu shot given today BP elevated and has been Added losartan to HCTZ  Keep BP log Follow up in 2 weeks      Iran Planas, PA-C

## 2021-10-04 NOTE — Patient Instructions (Signed)

## 2021-10-05 ENCOUNTER — Encounter: Payer: Self-pay | Admitting: Physician Assistant

## 2021-10-05 LAB — CBC WITH DIFFERENTIAL/PLATELET
Absolute Monocytes: 413 cells/uL (ref 200–950)
Basophils Absolute: 43 cells/uL (ref 0–200)
Basophils Relative: 0.5 %
Eosinophils Absolute: 69 cells/uL (ref 15–500)
Eosinophils Relative: 0.8 %
HCT: 42.7 % (ref 35.0–45.0)
Hemoglobin: 14.5 g/dL (ref 11.7–15.5)
Lymphs Abs: 2176 cells/uL (ref 850–3900)
MCH: 28.8 pg (ref 27.0–33.0)
MCHC: 34 g/dL (ref 32.0–36.0)
MCV: 84.9 fL (ref 80.0–100.0)
MPV: 10.6 fL (ref 7.5–12.5)
Monocytes Relative: 4.8 %
Neutro Abs: 5900 cells/uL (ref 1500–7800)
Neutrophils Relative %: 68.6 %
Platelets: 285 10*3/uL (ref 140–400)
RBC: 5.03 10*6/uL (ref 3.80–5.10)
RDW: 13 % (ref 11.0–15.0)
Total Lymphocyte: 25.3 %
WBC: 8.6 10*3/uL (ref 3.8–10.8)

## 2021-10-05 LAB — COMPLETE METABOLIC PANEL WITH GFR
AG Ratio: 1.7 (calc) (ref 1.0–2.5)
ALT: 17 U/L (ref 6–29)
AST: 12 U/L (ref 10–30)
Albumin: 4.5 g/dL (ref 3.6–5.1)
Alkaline phosphatase (APISO): 90 U/L (ref 31–125)
BUN: 9 mg/dL (ref 7–25)
CO2: 26 mmol/L (ref 20–32)
Calcium: 9.2 mg/dL (ref 8.6–10.2)
Chloride: 104 mmol/L (ref 98–110)
Creat: 0.75 mg/dL (ref 0.50–0.99)
Globulin: 2.7 g/dL (calc) (ref 1.9–3.7)
Glucose, Bld: 80 mg/dL (ref 65–99)
Potassium: 4 mmol/L (ref 3.5–5.3)
Sodium: 139 mmol/L (ref 135–146)
Total Bilirubin: 0.4 mg/dL (ref 0.2–1.2)
Total Protein: 7.2 g/dL (ref 6.1–8.1)
eGFR: 103 mL/min/{1.73_m2} (ref >=60)

## 2021-10-05 LAB — LIPID PANEL W/REFLEX DIRECT LDL
Cholesterol: 163 mg/dL (ref <200)
HDL: 44 mg/dL — ABNORMAL LOW (ref >=50)
LDL Cholesterol (Calc): 104 mg/dL (calc) — ABNORMAL HIGH
Non-HDL Cholesterol (Calc): 119 mg/dL (calc) (ref <130)
Total CHOL/HDL Ratio: 3.7 (calc) (ref <5.0)
Triglycerides: 61 mg/dL (ref <150)

## 2021-10-05 LAB — VITAMIN D 25 HYDROXY (VIT D DEFICIENCY, FRACTURES): Vit D, 25-Hydroxy: 22 ng/mL — ABNORMAL LOW (ref 30–100)

## 2021-10-05 LAB — TSH: TSH: 1.14 mIU/L

## 2021-10-05 LAB — B12 AND FOLATE PANEL
Folate: 7.4 ng/mL
Vitamin B-12: 391 pg/mL (ref 200–1100)

## 2021-10-05 NOTE — Progress Notes (Signed)
Alicia Sanders,   Kidney, liver, glucose looks great.  Thyroid is normal.  wBC and hemoglobin look great.  Vitamin D is low. Increase vitamin D to 2000 units daily.  LdL right at optimal goal.  HDL a little low. Increase exercise to see if we can get above 50.   Very low 10 year CV risk.   Marland Kitchen.The 10-year ASCVD risk score (Arnett DK, et al., 2019) is: 1.1%   Values used to calculate the score:     Age: 41 years     Sex: Female     Is Non-Hispanic African American: No     Diabetic: No     Tobacco smoker: No     Systolic Blood Pressure: 366 mmHg     Is BP treated: Yes     HDL Cholesterol: 44 mg/dL     Total Cholesterol: 163 mg/dL  B12 normal range but low normal. Start b12 supplement daily.

## 2021-10-16 ENCOUNTER — Other Ambulatory Visit: Payer: Self-pay | Admitting: Physician Assistant

## 2021-10-16 DIAGNOSIS — K580 Irritable bowel syndrome with diarrhea: Secondary | ICD-10-CM

## 2021-10-19 ENCOUNTER — Other Ambulatory Visit: Payer: Self-pay | Admitting: Physician Assistant

## 2021-10-19 ENCOUNTER — Encounter: Payer: Self-pay | Admitting: Physician Assistant

## 2021-10-19 DIAGNOSIS — M5030 Other cervical disc degeneration, unspecified cervical region: Secondary | ICD-10-CM

## 2021-10-20 MED ORDER — METHYLPREDNISOLONE 4 MG PO TBPK: ORAL_TABLET | ORAL | 0 refills | Status: DC

## 2021-10-20 NOTE — Telephone Encounter (Signed)
Last written 06/26/2021 #270 no refills  Last appt 10/04/2021

## 2021-11-04 ENCOUNTER — Other Ambulatory Visit: Payer: Self-pay | Admitting: Physician Assistant

## 2021-11-14 ENCOUNTER — Encounter: Payer: Self-pay | Admitting: Physician Assistant

## 2021-11-15 ENCOUNTER — Encounter: Payer: Self-pay | Admitting: Physician Assistant

## 2021-11-15 ENCOUNTER — Telehealth (INDEPENDENT_AMBULATORY_CARE_PROVIDER_SITE_OTHER): Payer: Managed Care, Other (non HMO) | Admitting: Physician Assistant

## 2021-11-15 VITALS — BP 128/78 | HR 80 | Ht 65.0 in | Wt 167.0 lb

## 2021-11-15 DIAGNOSIS — M25552 Pain in left hip: Secondary | ICD-10-CM

## 2021-11-15 DIAGNOSIS — B001 Herpesviral vesicular dermatitis: Secondary | ICD-10-CM | POA: Diagnosis not present

## 2021-11-15 MED ORDER — VALACYCLOVIR HCL 1 G PO TABS: 2000.0000 mg | ORAL_TABLET | Freq: Two times a day (BID) | ORAL | 0 refills | Status: DC

## 2021-11-15 MED ORDER — OXYCODONE-ACETAMINOPHEN 5-325 MG PO TABS: 1.0000 | ORAL_TABLET | Freq: Three times a day (TID) | ORAL | 0 refills | Status: DC | PRN

## 2021-11-15 MED ORDER — PREDNISONE 50 MG PO TABS: ORAL_TABLET | ORAL | 0 refills | Status: DC

## 2021-11-15 NOTE — Progress Notes (Signed)
Wellness program with work  Has been doing yoga and pilates Pain in left hip - has been worse since Monday  Not able to do yoga poses since then   Has been using heat/ice (alternating), OTC muscle rubs, ibuprofen with no help    .Marland KitchenVirtual Visit via Video Note  I connected with Alicia Sanders on 11/15/21 at  1:00 PM EST by a video enabled telemedicine application and verified that I am speaking with the correct person using two identifiers.  Location: Patient: work Provider: clinic  .Marland KitchenParticipating in visit:  Patient: Alicia Sanders Provider: Tandy Gaw PA-C   I discussed the limitations of evaluation and management by telemedicine and the availability of in person appointments. The patient expressed understanding and agreed to proceed.  History of Present Illness: Pt calls into the clinic left lateral hip severe pain. She has hx of chronic pain with lumbar DDD. This pain is different and tramadol not helping at all. Slightly radiation down lateral left leg. She has been doing a challenge for work with health and fitness and lost 20lbs and doing lots of exercises. She thinks one of the yoga exercises caused her back to flare up. She has tried tramadol/heat/ice/muscle rubs/ibuprofen without any relief.   She has a cold sore and needs treatment.     Observations/Objective: No acute distress Pain with self palpitation over left lateral hip over greater trochanter  .Marland Kitchen Today's Vitals   11/15/21 1125  BP: 128/78  Pulse: 80  Weight: 167 lb (75.8 kg)  Height: 5\' 5"  (1.651 m)   Body mass index is 27.79 kg/m.    Assessment and Plan: Marland KitchenDanyah was seen today for hip pain.  Diagnoses and all orders for this visit:  Left hip pain -     predniSONE (DELTASONE) 50 MG tablet; Take one tablet daily for 5 days. -     oxyCODONE-acetaminophen (PERCOCET/ROXICET) 5-325 MG tablet; Take 1 tablet by mouth every 8 (eight) hours as needed. For break through pain.  Cold sore -      valACYclovir (VALTREX) 1000 MG tablet; Take 2 tablets (2,000 mg total) by mouth 2 (two) times daily for 3 days. For one day as needed for cold sore   Likely hip bursitis Prednisone for 5 days and oxycodone small quantity given .Lurena JoinerPDMP reviewed during this encounter. Pt aware not to take oxy with tramadol Last oxy refill was 08/2021 Discussed rest, ice and stretches Follow up as needed if symptoms worsen or persist.    Follow Up Instructions:    I discussed the assessment and treatment plan with the patient. The patient was provided an opportunity to ask questions and all were answered. The patient agreed with the plan and demonstrated an understanding of the instructions.   The patient was advised to call back or seek an in-person evaluation if the symptoms worsen or if the condition fails to improve as anticipated.  09/2021, PA-C

## 2021-11-17 ENCOUNTER — Telehealth: Payer: Managed Care, Other (non HMO) | Admitting: Physician Assistant

## 2021-11-28 ENCOUNTER — Encounter: Payer: Self-pay | Admitting: Physician Assistant

## 2021-12-18 ENCOUNTER — Other Ambulatory Visit: Payer: Self-pay | Admitting: Physician Assistant

## 2021-12-18 DIAGNOSIS — F39 Unspecified mood [affective] disorder: Secondary | ICD-10-CM

## 2021-12-27 ENCOUNTER — Ambulatory Visit
Admission: EM | Admit: 2021-12-27 | Discharge: 2021-12-27 | Disposition: A | Discharge status: Home / Self Care | Payer: Managed Care, Other (non HMO) | Attending: Family Medicine | Admitting: Family Medicine

## 2021-12-27 ENCOUNTER — Encounter: Payer: Self-pay | Admitting: Emergency Medicine

## 2021-12-27 ENCOUNTER — Telehealth: Payer: Self-pay | Admitting: Sports Medicine

## 2021-12-27 DIAGNOSIS — U071 COVID-19: Secondary | ICD-10-CM | POA: Insufficient documentation

## 2021-12-27 DIAGNOSIS — Z20822 Contact with and (suspected) exposure to covid-19: Secondary | ICD-10-CM

## 2021-12-27 MED ORDER — PREDNISONE 50 MG PO TABS: 50.0000 mg | ORAL_TABLET | Freq: Every day | ORAL | 0 refills | Status: DC

## 2021-12-27 MED ORDER — HYDROCOD POLI-CHLORPHE POLI ER 10-8 MG/5ML PO SUER: 5.0000 mL | Freq: Two times a day (BID) | ORAL | 0 refills | Status: DC | PRN

## 2021-12-27 MED ORDER — NIRMATRELVIR/RITONAVIR (PAXLOVID)TABLET: ORAL_TABLET | ORAL | 0 refills | Status: DC

## 2021-12-27 NOTE — Assessment & Plan Note (Signed)
Pleasant previously healthy 41 year old female, has had several days of increasing malaise, myalgias, cough, sore throat, fatigue. I saw her husband today, he did test positive for COVID-19, she does appear quite ill so we will go ahead and call her in Paxlovid, prednisone, Tussionex, and write her out of work for 7 days. She will need to isolate for 5 days and wear a mask for 5 additional days around other people.

## 2021-12-27 NOTE — ED Triage Notes (Signed)
Patient c/o cough, congestion, sore throat, bilateral ear pain x 4 days.  Unable to sleep.  Patient has taken Mucinex DM, Nyquil and Dayquil, Vitamin C.

## 2021-12-27 NOTE — ED Provider Notes (Signed)
After being triaged into room, patient's PCP arrived in the urgent care center offering to take over her care.  She was discharged with Dr. Benjamin Stain assuming care   Eustace Moore, MD 12/27/21 1204

## 2021-12-27 NOTE — Discharge Instructions (Signed)
Follow instructions from PCP

## 2021-12-27 NOTE — Telephone Encounter (Signed)
COVID-19 Pleasant previously healthy 41 year old female, has had several days of increasing malaise, myalgias, cough, sore throat, fatigue. I saw her husband today, he did test positive for COVID-19, she does appear quite ill so we will go ahead and call her in Paxlovid, prednisone, Tussionex, and write her out of work for 7 days. She will need to isolate for 5 days and wear a mask for 5 additional days around other people.

## 2021-12-28 ENCOUNTER — Other Ambulatory Visit: Payer: Self-pay | Admitting: Sports Medicine

## 2021-12-28 DIAGNOSIS — U071 COVID-19: Secondary | ICD-10-CM

## 2021-12-28 MED ORDER — HYDROCOD POLI-CHLORPHE POLI ER 10-8 MG/5ML PO SUER: 5.0000 mL | Freq: Two times a day (BID) | ORAL | 0 refills | Status: DC | PRN

## 2021-12-30 ENCOUNTER — Other Ambulatory Visit: Payer: Self-pay | Admitting: Physician Assistant

## 2021-12-30 DIAGNOSIS — I10 Essential (primary) hypertension: Secondary | ICD-10-CM

## 2022-01-03 ENCOUNTER — Encounter: Payer: Self-pay | Admitting: Family Medicine

## 2022-01-03 ENCOUNTER — Ambulatory Visit (INDEPENDENT_AMBULATORY_CARE_PROVIDER_SITE_OTHER): Payer: Managed Care, Other (non HMO) | Admitting: Family Medicine

## 2022-01-03 ENCOUNTER — Encounter: Payer: Self-pay | Admitting: Physician Assistant

## 2022-01-03 VITALS — BP 134/86 | HR 99 | Temp 97.8°F | Ht 65.5 in | Wt 165.1 lb

## 2022-01-03 DIAGNOSIS — U071 COVID-19: Secondary | ICD-10-CM | POA: Diagnosis not present

## 2022-01-03 DIAGNOSIS — L301 Dyshidrosis [pompholyx]: Secondary | ICD-10-CM

## 2022-01-03 MED ORDER — MUPIROCIN 2 % EX OINT: TOPICAL_OINTMENT | Freq: Every day | CUTANEOUS | 0 refills | Status: AC

## 2022-01-03 MED ORDER — METHYLPREDNISOLONE 4 MG PO TBPK: ORAL_TABLET | ORAL | 0 refills | Status: DC

## 2022-01-03 NOTE — Progress Notes (Signed)
Acute Office Visit  Subjective:     Patient ID: Alicia Sanders, female    DOB: [birth year], 42 y.o.   MRN: [id number]  Chief Complaint  Patient presents with   Acute Visit    Patient c/o fatigue, cough,nasal congestin, bilateral ears feel full,/ pressure/ pain- bilateral ear   tinnitus, eczema worse on bilateral hands, weakness = husband told pt that she was choking on post nasal drip - yellowish in color - symptoms continuing after positive COVID test last Tuesday - with Dr. Darene Lamer - requesting a letter for work - requesting to be out the rest of week due to malaise and fatigue     HPI Patient is in today for acute visit. Pt is new to me.  Pt reports on 12/22 she had food poisoning. She then was diagnosed with Covid on 12/27. She reports head congestion, pressure in her face and ears, weakness, fatigue, cough worse at night. She has been taking Mucinex, dayquil/nyquil, along with cough syrup that was given to her by Dr T. She still has a cough but improved with treatments. At night her cough is the worse. Husband wakes her up due to gurgling sounds during sleep from the congestion and drainage. Denies chest pains or SOB. Having general malaise and fatigue.   She also reports in the last few days, her hands are dry and cracking. 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 She hasn't moisturized her hands after washing.   Patient Active Problem List   Diagnosis Date Noted   COVID-19 12/27/2021   Primary hypertension 10/04/2021   Elevated blood pressure reading 05/23/2021   Intractable migraine without aura and with status migrainosus 05/23/2021   Stress at work 05/23/2021   Gastroenteritis 02/06/2021   Vitamin D deficiency 07/02/2019   Snoring 06/29/2019   Appendicitis, acute 01/08/2019   Acute appendicitis 01/08/2019   Thyroid disease 01/07/2019   Hirsutism 03/05/2018   Nausea 09/23/2017   Mesenteric adenitis 07/24/2017   Acute abdominal pain in right lower  quadrant 01/28/2017   Other viral warts 06/26/2016   Overweight (BMI 25.0-29.9) 06/25/2016   Dysphagia 01/27/2016   Mood disorder ([place]) 01/27/2016   DDD (degenerative disc disease), cervical 01/27/2016   Dyshidrotic eczema 09/01/2015   Trochanteric bursitis, left hip 08/28/2012   DDD (degenerative disc disease), lumbar 05/08/2012   Hypothyroidism 07/11/2010   BREAST CYST 01/19/2009   Allergic rhinitis 11/26/2007     Review of Systems  Constitutional:  Positive for malaise/fatigue. Negative for chills.  HENT:  Positive for congestion, ear discharge and sinus pain. Negative for sore throat.   Respiratory:  Positive for cough.   Skin:  Positive for rash.  All other systems reviewed and are negative.       Objective:    BP 134/86   Pulse 99   Temp 97.8 F (36.6 C)   Ht 5' 5.5" (1.664 m)   Wt 165 lb 2 oz (74.9 kg)   SpO2 98%   BMI 27.06 kg/m    Physical Exam Vitals and nursing note reviewed.  Constitutional:      General: She is not in acute distress.    Appearance: Normal appearance. She is normal weight. She is not ill-appearing or toxic-appearing.     Comments: Malaise appearing   HENT:     Head: Normocephalic and atraumatic.     Right Ear: Tympanic membrane, ear canal and external ear normal.     Left Ear: Tympanic membrane, ear  canal and external ear normal.     Nose: Nose normal.     Mouth/Throat:     Mouth: Mucous membranes are moist.     Pharynx: Oropharynx is clear.     Comments: Postnasal drainage Eyes:     Extraocular Movements: Extraocular movements intact.     Conjunctiva/sclera: Conjunctivae normal.     Pupils: Pupils are equal, round, and reactive to light.  Cardiovascular:     Rate and Rhythm: Normal rate and regular rhythm.     Pulses: Normal pulses.     Heart sounds: Normal heart sounds.  Pulmonary:     Effort: Pulmonary effort is normal.     Breath sounds: Normal breath sounds.  Abdominal:     General: Abdomen is flat. Bowel sounds are  normal.  Skin:    General: Skin is warm.     Capillary Refill: Capillary refill takes less than 2 seconds.     Comments: Dry scaly hands bilaterally  Neurological:     General: No focal deficit present.     Mental Status: She is alert and oriented to person, place, and time. Mental status is at baseline.  Psychiatric:        Mood and Affect: Mood normal.        Behavior: Behavior normal.        Thought Content: Thought content normal.        Judgment: Judgment normal.    No results found for any visits on 01/03/22.      Assessment & Plan:   Problem List Items Addressed This Visit       Musculoskeletal and Integument   Dyshidrotic eczema   Relevant Medications   mupirocin ointment (BACTROBAN) 2 %     Other   COVID-19 - Primary   Relevant Medications   mupirocin ointment (BACTROBAN) 2 %   methylPREDNISolone (MEDROL DOSEPAK) 4 MG TBPK tablet    Meds ordered this encounter  Medications   mupirocin ointment (BACTROBAN) 2 %    Sig: Apply topically daily.    Dispense:  15 g    Refill:  0   methylPREDNISolone (MEDROL DOSEPAK) 4 MG TBPK tablet    Sig: 6-day pack as directed    Dispense:  21 tablet    Refill:  0   Add medrol dose pack for congestion and cough associated with COVID 19. May continue Mucinex and cough syrup to use prn. Eczema likely from viral illness, dehydration and environmental factors (washing  hands and decrease fluid intake) Add bactroban rx to use on cracks on hand and will need to stay hydrated along with moisturizing hands with unscented lotions ie aveeno or gold bond.  Out of work through Friday return on Monday. No follow-ups on file.  Leeanne Rio, MD

## 2022-01-10 ENCOUNTER — Encounter: Payer: Self-pay | Admitting: Sports Medicine

## 2022-01-10 DIAGNOSIS — U071 COVID-19: Secondary | ICD-10-CM

## 2022-01-11 MED ORDER — HYDROCOD POLI-CHLORPHE POLI ER 10-8 MG/5ML PO SUER: 5.0000 mL | Freq: Two times a day (BID) | ORAL | 0 refills | Status: DC | PRN

## 2022-01-11 NOTE — Telephone Encounter (Signed)
I spent 5 total minutes of online digital evaluation and management services in this patient-initiated request for online care. 

## 2022-01-11 NOTE — Addendum Note (Signed)
Addended by: Narda Rutherford on: 01/11/2022 03:14 PM   Modules accepted: Orders

## 2022-01-11 NOTE — Addendum Note (Signed)
Addended by: Silverio Decamp on: 01/11/2022 03:19 PM   Modules accepted: Orders

## 2022-01-12 ENCOUNTER — Encounter: Payer: Self-pay | Admitting: Physician Assistant

## 2022-01-12 ENCOUNTER — Ambulatory Visit
Admission: EM | Admit: 2022-01-12 | Discharge: 2022-01-12 | Disposition: A | Discharge status: Home / Self Care | Payer: Managed Care, Other (non HMO) | Attending: Family Medicine | Admitting: Family Medicine

## 2022-01-12 ENCOUNTER — Encounter: Payer: Self-pay | Admitting: Sports Medicine

## 2022-01-12 ENCOUNTER — Encounter: Payer: Self-pay | Admitting: Emergency Medicine

## 2022-01-12 ENCOUNTER — Ambulatory Visit: Payer: Managed Care, Other (non HMO)

## 2022-01-12 ENCOUNTER — Ambulatory Visit: Payer: Managed Care, Other (non HMO) | Admitting: Sports Medicine

## 2022-01-12 VITALS — BP 161/99 | HR 128

## 2022-01-12 DIAGNOSIS — R053 Chronic cough: Secondary | ICD-10-CM

## 2022-01-12 DIAGNOSIS — U099 Post covid-19 condition, unspecified: Secondary | ICD-10-CM | POA: Diagnosis not present

## 2022-01-12 DIAGNOSIS — U071 COVID-19: Secondary | ICD-10-CM

## 2022-01-12 DIAGNOSIS — R059 Cough, unspecified: Secondary | ICD-10-CM

## 2022-01-12 LAB — POCT INFLUENZA A/B
Influenza A, POC: NEGATIVE
Influenza B, POC: NEGATIVE

## 2022-01-12 LAB — POC COVID19 BINAXNOW: SARS Coronavirus 2 Ag: NEGATIVE

## 2022-01-12 MED ORDER — DOXYCYCLINE HYCLATE 100 MG PO CAPS: 100.0000 mg | ORAL_CAPSULE | Freq: Two times a day (BID) | ORAL | 0 refills | Status: DC

## 2022-01-12 MED ORDER — BENZONATATE 200 MG PO CAPS: 200.0000 mg | ORAL_CAPSULE | Freq: Three times a day (TID) | ORAL | 0 refills | Status: DC | PRN

## 2022-01-12 NOTE — Addendum Note (Signed)
Addended by: Silverio Decamp on: 01/12/2022 12:46 PM   Modules accepted: Orders

## 2022-01-12 NOTE — Discharge Instructions (Addendum)
Advised patient to take medications as directed with food to completion.  Advised may take Tessalon daily or as needed for cough. Encouraged patient to increase daily water intake to 64 ounces per day while taking these medications.  Advised patient if symptoms worsen and/or unresolved please follow-up with PCP or here for further evaluation.

## 2022-01-12 NOTE — Addendum Note (Signed)
Addended by: Tarri Glenn A on: 01/12/2022 02:45 PM   Modules accepted: Orders

## 2022-01-12 NOTE — Progress Notes (Signed)
    Procedures performed today:    None.  Independent interpretation of notes and tests performed by another provider:   None.  Brief History, Exam, Impression, and Recommendations:    COVID-19 Pleasant 42 year old female, previously healthy got COVID around Christmas time, she was treated within the window with Tamiflu, prednisone, antitussives. She actually improved, then a day or 2 ago developed worsening fatigue, achiness, breathlessness. She is not febrile but she does have some shakiness and rigors. Mild chest tightness. She was seen in urgent care earlier today, got a chest x-ray that was clear. She presents here with persistent symptoms. On exam she appears breathless, uncomfortable, head and neck exam are unremarkable, chest exam is significant for tachycardia, lungs are clear. No lower extremity swelling, negative Homans' sign bilaterally. Differential is broad, this could be a COVID relapse, new diagnosis of influenza, PE is in the differential. We swabbed her for flu and COVID, both were negative, she should continue her doxycycline prescribed in the urgent care, and get some rest at home, we will write her out of work for 7 days and she does need to keep close follow-up with [place] at least by phone. We will keep an eye out for her labs, particularly her D-dimer.    ____________________________________________ Gwen Her. Dianah Field, M.D., ABFM., CAQSM., AME. Primary Care and Sports Medicine [facility]  Adjunct Professor of [employer] [facility] [facility] [employer] [facility]  Risk manager

## 2022-01-12 NOTE — ED Triage Notes (Signed)
Pt states she has been having generalized body aches, fatigue, bilateral ear pressure and cough since she was diagnosed with covid on 12/27. States her BP has been elevated (161W Systolic) for the last couple days in spite of taking BP meds. She is still taking prednisone but states she has not had antibiotics.

## 2022-01-12 NOTE — ED Provider Notes (Addendum)
Alicia Sanders CARE    CSN: [id number] Arrival date & time: 01/12/22  1029      History   Chief Complaint Chief Complaint  Patient presents with   Generalized Body Aches    HPI Alicia Sanders is a 42 y.o. female.   HPI 42 year old female presents has been having generalized bodyaches, fatigue, bilateral ear pressure since cough she was diagnosed with COVID on 12/27.  Patient reports that she is still taking prednisone but has not had a course of antibiotics.  Reports that systolic blood pressure has been in the 160s.  Made significant for obesity, cough, pleuritic chest pain.  Past Medical History:  Diagnosis Date   Back problem    Bilateral lower abdominal discomfort 03/05/2018   Bloating 03/05/2018   Change in bowel habits 09/23/2017   Cough 01/23/2017   Eczema    Hypothyroidism    No energy 03/05/2018   Pleuritic chest pain 01/27/2016   Preventive measure 05/08/2012   Weight gain 04/18/2015    Patient Active Problem List   Diagnosis Date Noted   COVID-19 12/27/2021   Primary hypertension 10/04/2021   Elevated blood pressure reading 05/23/2021   Intractable migraine without aura and with status migrainosus 05/23/2021   Stress at work 05/23/2021   Gastroenteritis 02/06/2021   Vitamin D deficiency 07/02/2019   Snoring 06/29/2019   Appendicitis, acute 01/08/2019   Acute appendicitis 01/08/2019   Thyroid disease 01/07/2019   Hirsutism 03/05/2018   Nausea 09/23/2017   Mesenteric adenitis 07/24/2017   Acute abdominal pain in right lower quadrant 01/28/2017   Other viral warts 06/26/2016   Overweight (BMI 25.0-29.9) 06/25/2016   Dysphagia 01/27/2016   Mood disorder ([place]) 01/27/2016   DDD (degenerative disc disease), cervical 01/27/2016   Dyshidrotic eczema 09/01/2015   Trochanteric bursitis, left hip 08/28/2012   DDD (degenerative disc disease), lumbar 05/08/2012   Hypothyroidism 07/11/2010   BREAST CYST 01/19/2009   Allergic rhinitis 11/26/2007    Past  Surgical History:  Procedure Laterality Date   HAND SURGERY Right    INTRAUTERINE DEVICE INSERTION     LAPAROSCOPIC APPENDECTOMY N/A 01/08/2019   Procedure: APPENDECTOMY LAPAROSCOPIC;  Surgeon: Erroll Luna, MD;  Location: [facility];  Service: General;  Laterality: N/A;    OB History   No obstetric history on file.      Home Medications    Prior to Admission medications   Medication Sig Start Date End Date Taking? Authorizing Provider  benzonatate (TESSALON) 200 MG capsule Take 1 capsule (200 mg total) by mouth 3 (three) times daily as needed for up to 7 days. 01/12/22 01/19/22 Yes Eliezer Lofts, FNP  doxycycline (VIBRAMYCIN) 100 MG capsule Take 1 capsule (100 mg total) by mouth 2 (two) times daily for 7 days. 01/12/22 01/19/22 Yes Eliezer Lofts, FNP  albuterol (PROVENTIL HFA;VENTOLIN HFA) 108 (90 Base) MCG/ACT inhaler 2 puffs 15 minutes before physical activity and exercise 02/11/17   Silverio Decamp, MD  buPROPion (WELLBUTRIN XL) 300 MG 24 hr tablet Take 1 tablet (300 mg total) by mouth daily. 02/03/21   Breeback, [name] L, PA-C  chlorpheniramine-HYDROcodone (TUSSIONEX) 10-8 MG/5ML Take 5 mLs by mouth every 12 (twelve) hours as needed for cough (cough, will cause drowsiness.). 01/11/22   Silverio Decamp, MD  Clindamycin-Benzoyl Per, Refr, gel APPLY TO AFFECTED AREA TWICE A DAY 09/07/21   Breeback, [name] L, PA-C  dicyclomine (BENTYL) 10 MG capsule TAKE 1 CAPSULE (10 MG) BY MOUTH 3 (THREE) TIMES DAILY AS NEEDED FOR SPASMS. 10/16/21  Breeback, [name] L, PA-C  hydrochlorothiazide (HYDRODIURIL) 25 MG tablet Take 1 tablet (25 mg total) by mouth daily. 10/04/21   Breeback, Royetta Car, PA-C  levothyroxine (SYNTHROID) 50 MCG tablet TAKE 1 TABLET BY MOUTH EVERY DAY 11/06/21   Breeback, [name] L, PA-C  losartan (COZAAR) 25 MG tablet TAKE 1 TABLET (25 MG TOTAL) BY MOUTH DAILY. 01/02/22   Breeback, [name] L, PA-C  methylPREDNISolone (MEDROL DOSEPAK) 4 MG TBPK tablet 6-day pack as directed 01/03/22   Leeanne Rio, MD  montelukast (SINGULAIR) 10 MG tablet TAKE 1 TABLET BY MOUTH EVERYDAY AT BEDTIME 05/29/18   Breeback, [name] L, PA-C  mupirocin ointment (BACTROBAN) 2 % Apply topically daily. 01/03/22   Leeanne Rio, MD  nirmatrelvir/ritonavir (PAXLOVID) 20 x 150 MG & 10 x 100MG  TABS 1 dose p.o. twice daily for 5 days, may use any available formulation at pharmacy with the same total dosage. 12/27/21   Silverio Decamp, MD  ondansetron (ZOFRAN-ODT) 8 MG disintegrating tablet Take 1 tablet (8 mg total) by mouth every 8 (eight) hours as needed for nausea. 01/06/19   Orma Render, NP  oxyCODONE-acetaminophen (PERCOCET/ROXICET) 5-325 MG tablet Take 1 tablet by mouth every 8 (eight) hours as needed. For break through pain. 11/15/21   Breeback, [name] L, PA-C  Rimegepant Sulfate (NURTEC) 75 MG TBDP Take 1 tablet by mouth as needed. No more than one tablet in 24 hour period. 06/02/21   Breeback, Royetta Car, PA-C  SUMAtriptan (IMITREX) 100 MG tablet Take 1 tablet (100 mg total) by mouth every 2 (two) hours as needed for migraine. May repeat in 2 hours if headache persists or recurs. 05/22/21   Breeback, [name] L, PA-C  traMADol (ULTRAM) 50 MG tablet TAKE 1 TABLET BY MOUTH THREE TIMES A DAY AS NEEDED 10/20/21   Breeback, [name] L, PA-C  tretinoin (RETIN-A) 0.05 % cream Apply 1 application  topically daily as needed (ezma). 08/08/18   [provider]    Family History Family History  Problem Relation Age of Onset   Endometrial cancer Mother    Heart disease Father    Heart disease Brother    Diabetes Maternal Grandmother    Lung cancer Maternal Grandfather    Heart disease Paternal Grandfather    Colon cancer Neg Hx    Esophageal cancer Neg Hx    Inflammatory bowel disease Neg Hx    Liver disease Neg Hx    Pancreatic cancer Neg Hx    Rectal cancer Neg Hx    Stomach cancer Neg Hx    Breast cancer Neg Hx     Social History Social History   Tobacco Use   Smoking status: Never   Smokeless tobacco:  Never  Substance Use Topics   Alcohol use: Yes    Comment: occasional   Drug use: Never     Allergies   Thimerosal (thiomersal), Adhesive [tape], and Hydrocodone   Review of Systems Review of Systems  Constitutional:  Positive for fatigue and fever.  Respiratory:  Positive for cough.   Musculoskeletal:  Positive for arthralgias and myalgias.  All other systems reviewed and are negative.    Physical Exam Triage Vital Signs ED Triage Vitals  Enc Vitals Group     BP      Pulse      Resp      Temp      Temp src      SpO2      Weight      Height  Head Circumference      Peak Flow      Pain Score      Pain Loc      Pain Edu?      Excl. in GC?    No data found.  Updated Vital Signs BP (!) 157/92 (BP Location: Right Arm)   Pulse (!) 110   Temp 98.2 F (36.8 C) (Oral)   Resp 18   SpO2 96%   Visual Acuity Right Eye Distance:   Left Eye Distance:   Bilateral Distance:    Right Eye Near:   Left Eye Near:    Bilateral Near:     Physical Exam Vitals and nursing note reviewed.  Constitutional:      General: She is not in acute distress.    Appearance: Normal appearance. She is obese. She is ill-appearing.  HENT:     Head: Normocephalic and atraumatic.     Right Ear: Tympanic membrane, ear canal and external ear normal.     Left Ear: Tympanic membrane, ear canal and external ear normal.     Mouth/Throat:     Mouth: Mucous membranes are moist.     Pharynx: Oropharynx is clear.  Eyes:     Extraocular Movements: Extraocular movements intact.     Conjunctiva/sclera: Conjunctivae normal.     Pupils: Pupils are equal, round, and reactive to light.  Cardiovascular:     Rate and Rhythm: Normal rate and regular rhythm.     Pulses: Normal pulses.     Heart sounds: Normal heart sounds.  Pulmonary:     Effort: Pulmonary effort is normal.     Breath sounds: Normal breath sounds. No wheezing, rhonchi or rales.     Comments: Infrequent nonproductive cough  noted Musculoskeletal:        General: Normal range of motion.     Cervical back: Normal range of motion and neck supple.  Skin:    General: Skin is warm and dry.  Neurological:     General: No focal deficit present.     Mental Status: She is alert and oriented to person, place, and time.      UC Treatments / Results  Labs (all labs ordered are listed, but only abnormal results are displayed) Labs Reviewed - No data to display  EKG   Radiology DG Chest 2 View  Result Date: 01/12/2022 CLINICAL DATA:  Two to three-week history of cough with diagnosis of COVID infection EXAM: CHEST - 2 VIEW COMPARISON:  Chest radiograph dated 01/23/2017 FINDINGS: Normal lung volumes. No focal consolidations. No pleural effusion or pneumothorax. The heart size and mediastinal contours are within normal limits. The visualized skeletal structures are unremarkable. IMPRESSION: No active cardiopulmonary disease. Electronically Signed   By: Agustin Cree M.D.   On: 01/12/2022 11:48    Procedures Procedures (including critical care time)  Medications Ordered in UC Medications - No data to display  Initial Impression / Assessment and Plan / UC Course  I have reviewed the triage vital signs and the nursing notes.  Pertinent labs & imaging results that were available during my care of the patient were reviewed by me and considered in my medical decision making (see chart for details).     MDM: 1. Cough-CXR revealed no acute cardiopulmonary process Rx'd doxycycline, Tessalon. Advised patient to take medications as directed with food to completion.  Advised may take Tessalon daily or as needed for cough. Encouraged patient to increase daily water intake to 64 ounces per day  while taking these medications.  Advised patient if symptoms worsen and/or unresolved please follow-up with PCP or here for further evaluation.  Patient discharged home, hemodynamically stable.  Note provided to patient per request prior to  discharge. Final Clinical Impressions(s) / UC Diagnoses   Final diagnoses:  Cough, unspecified type     Discharge Instructions      Advised patient to take medications as directed with food to completion.  Advised may take Tessalon daily or as needed for cough. Encouraged patient to increase daily water intake to 64 ounces per day while taking these medications.  Advised patient if symptoms worsen and/or unresolved please follow-up with PCP or here for further evaluation.     ED Prescriptions     Medication Sig Dispense Auth. Provider   doxycycline (VIBRAMYCIN) 100 MG capsule Take 1 capsule (100 mg total) by mouth 2 (two) times daily for 7 days. 14 capsule Trevor Iha, FNP   benzonatate (TESSALON) 200 MG capsule Take 1 capsule (200 mg total) by mouth 3 (three) times daily as needed for up to 7 days. 40 capsule Trevor Iha, FNP      PDMP not reviewed this encounter.   Trevor Iha, FNP 01/12/22 1255    Trevor Iha, FNP 01/12/22 1944

## 2022-01-12 NOTE — Telephone Encounter (Signed)
Please get her in for a nurse visit for at least some vital signs including pulse ox and temperature.  She will also be coming for a chest x-ray.

## 2022-01-12 NOTE — Assessment & Plan Note (Addendum)
Pleasant 42 year old female, previously healthy got COVID around Christmas time, she was treated within the window with Tamiflu, prednisone, antitussives. She actually improved, then a day or 2 ago developed worsening fatigue, achiness, breathlessness. She is not febrile but she does have some shakiness and rigors. Mild chest tightness. She was seen in urgent care earlier today, got a chest x-ray that was clear. She presents here with persistent symptoms. On exam she appears breathless, uncomfortable, head and neck exam are unremarkable, chest exam is significant for tachycardia, lungs are clear. No lower extremity swelling, negative Homans' sign bilaterally. Differential is broad, this could be a COVID relapse, new diagnosis of influenza, PE is in the differential. We swabbed her for flu and COVID, both were negative, she should continue her doxycycline prescribed in the urgent care, and get some rest at home, we will write her out of work for 7 days and she does need to keep close follow-up with [place] at least by phone. We will keep an eye out for her labs, particularly her D-dimer.

## 2022-01-13 LAB — CBC WITH DIFFERENTIAL/PLATELET
Absolute Monocytes: 255 cells/uL (ref 200–950)
Basophils Absolute: 13 cells/uL (ref 0–200)
Basophils Relative: 0.1 %
Eosinophils Absolute: 0 cells/uL — ABNORMAL LOW (ref 15–500)
Eosinophils Relative: 0 %
HCT: 45.1 % — ABNORMAL HIGH (ref 35.0–45.0)
Hemoglobin: 15.4 g/dL (ref 11.7–15.5)
Lymphs Abs: 1621 cells/uL (ref 850–3900)
MCH: 29.3 pg (ref 27.0–33.0)
MCHC: 34.1 g/dL (ref 32.0–36.0)
MCV: 85.7 fL (ref 80.0–100.0)
MPV: 10.4 fL (ref 7.5–12.5)
Monocytes Relative: 1.9 %
Neutro Abs: 11511 cells/uL — ABNORMAL HIGH (ref 1500–7800)
Neutrophils Relative %: 85.9 %
Platelets: 351 10*3/uL (ref 140–400)
RBC: 5.26 10*6/uL — ABNORMAL HIGH (ref 3.80–5.10)
RDW: 13.2 % (ref 11.0–15.0)
Total Lymphocyte: 12.1 %
WBC: 13.4 10*3/uL — ABNORMAL HIGH (ref 3.8–10.8)

## 2022-01-13 LAB — COMPLETE METABOLIC PANEL WITH GFR
AG Ratio: 1.6 (calc) (ref 1.0–2.5)
ALT: 33 U/L — ABNORMAL HIGH (ref 6–29)
AST: 12 U/L (ref 10–30)
Albumin: 4.8 g/dL (ref 3.6–5.1)
Alkaline phosphatase (APISO): 77 U/L (ref 31–125)
BUN: 12 mg/dL (ref 7–25)
CO2: 25 mmol/L (ref 20–32)
Calcium: 9.3 mg/dL (ref 8.6–10.2)
Chloride: 99 mmol/L (ref 98–110)
Creat: 0.71 mg/dL (ref 0.50–0.99)
Globulin: 3 g/dL (calc) (ref 1.9–3.7)
Glucose, Bld: 158 mg/dL — ABNORMAL HIGH (ref 65–99)
Potassium: 4 mmol/L (ref 3.5–5.3)
Sodium: 137 mmol/L (ref 135–146)
Total Bilirubin: 0.4 mg/dL (ref 0.2–1.2)
Total Protein: 7.8 g/dL (ref 6.1–8.1)
eGFR: 109 mL/min/{1.73_m2} (ref >=60)

## 2022-01-13 LAB — TSH: TSH: 1.12 mIU/L

## 2022-01-13 LAB — D-DIMER, QUANTITATIVE: D-Dimer, Quant: 0.23 mcg/mL FEU (ref <0.50)

## 2022-01-13 LAB — CK: Total CK: 40 U/L (ref 29–143)

## 2022-01-15 ENCOUNTER — Encounter: Payer: Self-pay | Admitting: Sports Medicine

## 2022-01-15 DIAGNOSIS — U071 COVID-19: Secondary | ICD-10-CM

## 2022-01-15 DIAGNOSIS — R1032 Left lower quadrant pain: Secondary | ICD-10-CM

## 2022-01-18 ENCOUNTER — Ambulatory Visit (INDEPENDENT_AMBULATORY_CARE_PROVIDER_SITE_OTHER): Payer: Managed Care, Other (non HMO)

## 2022-01-18 DIAGNOSIS — R1032 Left lower quadrant pain: Secondary | ICD-10-CM

## 2022-01-18 MED ORDER — IOHEXOL 300 MG/ML  SOLN
100.0000 mL | Freq: Once | INTRAMUSCULAR | Status: AC | PRN
  Administered 2022-01-18: 100 mL via INTRAVENOUS

## 2022-01-18 NOTE — Addendum Note (Signed)
Addended by: Silverio Decamp on: 01/18/2022 09:14 AM   Modules accepted: Orders

## 2022-01-18 NOTE — Assessment & Plan Note (Signed)
Alicia Sanders is having increasing left lower quadrant abdominal pain, she is recently convalescing from [place], she has elevated white blood cell count with a left shift, we are getting additional labs, blood cultures and a stat abdominal and pelvic CT with oral and IV contrast.

## 2022-01-22 MED ORDER — DILTIAZEM HCL 30 MG PO TABS: 30.0000 mg | ORAL_TABLET | Freq: Three times a day (TID) | ORAL | 0 refills | Status: DC

## 2022-01-24 LAB — CBC WITH DIFFERENTIAL/PLATELET
Absolute Monocytes: 557 cells/uL (ref 200–950)
Basophils Absolute: 42 cells/uL (ref 0–200)
Basophils Relative: 0.4 %
Eosinophils Absolute: 105 cells/uL (ref 15–500)
Eosinophils Relative: 1 %
HCT: 43.4 % (ref 35.0–45.0)
Hemoglobin: 14.8 g/dL (ref 11.7–15.5)
Lymphs Abs: 3182 cells/uL (ref 850–3900)
MCH: 29.7 pg (ref 27.0–33.0)
MCHC: 34.1 g/dL (ref 32.0–36.0)
MCV: 87.1 fL (ref 80.0–100.0)
MPV: 10.3 fL (ref 7.5–12.5)
Monocytes Relative: 5.3 %
Neutro Abs: 6615 cells/uL (ref 1500–7800)
Neutrophils Relative %: 63 %
Platelets: 295 10*3/uL (ref 140–400)
RBC: 4.98 10*6/uL (ref 3.80–5.10)
RDW: 13.2 % (ref 11.0–15.0)
Total Lymphocyte: 30.3 %
WBC: 10.5 10*3/uL (ref 3.8–10.8)

## 2022-01-24 LAB — CULTURE, BLOOD (SINGLE)
MICRO NUMBER:: 14445387
Result:: NO GROWTH
SPECIMEN QUALITY:: ADEQUATE

## 2022-01-26 ENCOUNTER — Ambulatory Visit: Payer: Managed Care, Other (non HMO) | Admitting: Physician Assistant

## 2022-01-26 ENCOUNTER — Encounter: Payer: Self-pay | Admitting: Physician Assistant

## 2022-01-26 VITALS — BP 143/92 | HR 102 | Wt 166.0 lb

## 2022-01-26 DIAGNOSIS — Z8249 Family history of ischemic heart disease and other diseases of the circulatory system: Secondary | ICD-10-CM

## 2022-01-26 DIAGNOSIS — R0609 Other forms of dyspnea: Secondary | ICD-10-CM

## 2022-01-26 DIAGNOSIS — I951 Orthostatic hypotension: Secondary | ICD-10-CM | POA: Diagnosis not present

## 2022-01-26 DIAGNOSIS — I10 Essential (primary) hypertension: Secondary | ICD-10-CM | POA: Diagnosis not present

## 2022-01-26 DIAGNOSIS — U099 Post covid-19 condition, unspecified: Secondary | ICD-10-CM | POA: Diagnosis not present

## 2022-01-26 NOTE — Patient Instructions (Signed)
Orthostatic Hypotension Blood pressure is a measurement of how strongly, or weakly, your circulating blood is pressing against the walls of your arteries. Orthostatic hypotension is a drop in blood pressure that can happen when you change positions, such as when you go from lying down to standing. Arteries are blood vessels that carry blood from your heart throughout your body. When blood pressure is too low, you may not get enough blood to your brain or to the rest of your organs. Orthostatic hypotension can cause light-headedness, sweating, rapid heartbeat, blurred vision, and fainting. These symptoms require further investigation into the cause. What are the causes? Orthostatic hypotension can be caused by many things, including: Sudden changes in posture, such as standing up quickly after you have been sitting or lying down. Loss of blood (anemia) or loss of body fluids (dehydration). Heart problems, neurologic problems, or hormone problems. Pregnancy. Aging. The risk for this condition increases as you get older. Severe infection (sepsis). Certain medicines, such as medicines for high blood pressure or medicines that make the body lose excess fluids (diuretics). What are the signs or symptoms? Symptoms of this condition may include: Weakness, light-headedness, or dizziness. Sweating. Blurred vision. Tiredness (fatigue). Rapid heartbeat. Fainting, in severe cases. How is this diagnosed? This condition is diagnosed based on: Your symptoms and medical history. Your blood pressure measurements. Your health care provider will check your blood pressure when you are: Lying down. Sitting. Standing. A blood pressure reading is recorded as two numbers, such as "120 over 80" (or 120/80). The first ("top") number is called the systolic pressure. It is a measure of the pressure in your arteries as your heart beats. The second ("bottom") number is called the diastolic pressure. It is a measure of  the pressure in your arteries when your heart relaxes between beats. Blood pressure is measured in a unit called mmHg. Healthy blood pressure for most adults is 120/80 mmHg. Orthostatic hypotension is defined as a 20 mmHg drop in systolic pressure or a 10 mmHg drop in diastolic pressure within 3 minutes of standing. Other information or tests that may be used to diagnose orthostatic hypotension include: Your other vital signs, such as your heart rate and temperature. Blood tests. An electrocardiogram (ECG) or echocardiogram. A Holter monitor. This is a device you wear that records your heart rhythm continuously, usually for 24-48 hours. Tilt table test. For this test, you will be safely secured to a table that moves you from a lying position to an upright position. Your heart rhythm and blood pressure will be monitored during the test. How is this treated? This condition may be treated by: Changing your diet. This may involve eating more salt (sodium) or drinking more water. Changing the dosage of certain medicines you are taking that might be lowering your blood pressure. Correcting the underlying reason for the orthostatic hypotension. Wearing compression stockings. Taking medicines to raise your blood pressure. Avoiding actions that trigger symptoms. Follow these instructions at home: Medicines Take over-the-counter and prescription medicines only as told by your health care provider. Follow instructions from your health care provider about changing the dosage of your current medicines, if this applies. Do not stop or adjust any of your medicines on your own. Eating and drinking  Drink enough fluid to keep your urine pale yellow. Eat extra salt only as directed. Do not add extra salt to your diet unless advised by your health care provider. Eat frequent, small meals. Avoid standing up suddenly after eating. General instructions    Get up slowly from lying down or sitting positions. This  gives your blood pressure a chance to adjust. Avoid hot showers and excessive heat as directed by your health care provider. Engage in regular physical activity as directed by your health care provider. If you have compression stockings, wear them as told. Keep all follow-up visits. This is important. Contact a health care provider if: You have a fever for more than 2-3 days. You feel more thirsty than usual. You feel dizzy or weak. Get help right away if: You have chest pain. You have a fast or irregular heartbeat. You become sweaty or feel light-headed. You feel short of breath. You faint. You have any symptoms of a stroke. "BE FAST" is an easy way to remember the main warning signs of a stroke: B - Balance. Signs are dizziness, sudden trouble walking, or loss of balance. E - Eyes. Signs are trouble seeing or a sudden change in vision. F - Face. Signs are sudden weakness or numbness of the face, or the face or eyelid drooping on one side. A - Arms. Signs are weakness or numbness in an arm. This happens suddenly and usually on one side of the body. S - Speech. Signs are sudden trouble speaking, slurred speech, or trouble understanding what people say. T - Time. Time to call emergency services. Write down what time symptoms started. You have other signs of a stroke, such as: A sudden, severe headache with no known cause. Nausea or vomiting. Seizure. These symptoms may represent a serious problem that is an emergency. Do not wait to see if the symptoms will go away. Get medical help right away. Call your local emergency services (911 in the [place] Do not drive yourself to the hospital. Summary Orthostatic hypotension is a sudden drop in blood pressure. It can cause light-headedness, sweating, rapid heartbeat, blurred vision, and fainting. Orthostatic hypotension can be diagnosed by having your blood pressure taken while lying down, sitting, and then standing. Treatment may involve  changing your diet, wearing compression stockings, sitting up slowly, adjusting your medicines, or correcting the underlying reason for the orthostatic hypotension. Get help right away if you have chest pain, a fast or irregular heartbeat, or symptoms of a stroke. This information is not intended to replace advice given to you by your health care provider. Make sure you discuss any questions you have with your health care provider. Document Revised: 03/03/2020 Document Reviewed: 03/03/2020 Elsevier Patient Education  2023 Elsevier Inc.  

## 2022-01-26 NOTE — Progress Notes (Signed)
Acute Office Visit  Subjective:     Patient ID: Alicia Sanders, female    DOB: [birth year], 42 y.o.   MRN: [id number]  No chief complaint on file.   HPI Patient is in today for follow up after covid infection 01/03/2022 and secondary bacterial infection and just not feeling better. She has finished steroid pack, paxlovid, doxycycline. She continues to have dizziness, weakness, SOB, increased heart rate, sweating with any exertion after covid infection. She tried to work this week and with very little exertion she was dizzy and tachycardic with elevated blood pressure. Diltaziem was sent and has helped. Her mother is with her today and very concerned because of their familles heart disease history. No lower extremity edema. No problems laying flat at night. She is coughing some but more dry cough.   .. Family History  Problem Relation Age of Onset   Endometrial cancer Mother    Heart disease Father    Heart disease Brother    Diabetes Maternal Grandmother    Lung cancer Maternal Grandfather    Heart disease Paternal Grandfather    Colon cancer Neg Hx    Esophageal cancer Neg Hx    Inflammatory bowel disease Neg Hx    Liver disease Neg Hx    Pancreatic cancer Neg Hx    Rectal cancer Neg Hx    Stomach cancer Neg Hx    Breast cancer Neg Hx      ROS  See HPI.     Objective:    There were no vitals taken for this visit. BP Readings from Last 3 Encounters:  01/26/22 (!) 143/92  01/12/22 (!) 161/99  01/12/22 (!) 157/92   Wt Readings from Last 3 Encounters:  01/26/22 166 lb (75.3 kg)  01/03/22 165 lb 2 oz (74.9 kg)  12/27/21 153 lb (69.4 kg)    EKG: NSR at 90. No acute changes. No ST elevation or depression.      Physical Exam Constitutional:      Appearance: Normal appearance.  HENT:     Head: Normocephalic.     Right Ear: Tympanic membrane normal.     Left Ear: Tympanic membrane normal.     Nose: Nose normal.     Mouth/Throat:     Mouth: Mucous membranes  are moist.     Pharynx: No posterior oropharyngeal erythema.  Eyes:     Conjunctiva/sclera: Conjunctivae normal.  Neck:     Vascular: No carotid bruit.  Cardiovascular:     Rate and Rhythm: Regular rhythm. Tachycardia present.  Pulmonary:     Effort: Pulmonary effort is normal.     Breath sounds: Normal breath sounds.  Musculoskeletal:     Cervical back: Normal range of motion and neck supple. No rigidity or tenderness.     Right lower leg: No edema.     Left lower leg: No edema.  Lymphadenopathy:     Cervical: No cervical adenopathy.  Neurological:     Mental Status: She is alert.  Psychiatric:        Mood and Affect: Mood normal.           Assessment & Plan:  Marland KitchenMarland KitchenStepfanie was seen today for dizziness.  Diagnoses and all orders for this visit:  Post-COVID chronic dyspnea -     ECHOCARDIOGRAM COMPLETE; Future -     Ambulatory referral to Cardiology  Orthostatic hypotension -     Ambulatory referral to Cardiology -     EKG 12-Lead  Family history of  heart disease -     Ambulatory referral to Cardiology  Primary hypertension -     Ambulatory referral to Cardiology  Pulse ox 100 percent EKG looked great BP is elevated with evidence of orthostatic hypotension Suspect some covid induced cardiac instablity Due to ongoing symptoms and family hx will make referral to cardiology and order echo Continue diltaziem one to two tablets up to three times a day Wrote out of work for the next 2 weeks, will fill out FMLA. Continue to work on nutrition and hydration Do not limit salt Wear compression stockings HO on orthostatic hypotension given Follow up in office in 2 weeks     Alicia Planas, PA-C

## 2022-01-29 ENCOUNTER — Telehealth: Payer: Self-pay | Admitting: Physician Assistant

## 2022-01-29 NOTE — Telephone Encounter (Signed)
Pt dropped off FMLA paperwork to be filled out by [employer]. Paperwork left in [employer].

## 2022-01-30 NOTE — Telephone Encounter (Signed)
Left pt a voicemail to pick up paperwork.

## 2022-01-30 NOTE — Telephone Encounter (Signed)
Forms completed, copy sent to scan, copy to hold. Given to front desk to collect payment.

## 2022-01-30 NOTE — Telephone Encounter (Signed)
Letter written

## 2022-01-31 MED ORDER — TRAZODONE HCL 50 MG PO TABS: 25.0000 mg | ORAL_TABLET | Freq: Every evening | ORAL | 0 refills | Status: DC | PRN

## 2022-01-31 NOTE — Addendum Note (Signed)
Addended by: Donella Stade on: 01/31/2022 09:53 AM   Modules accepted: Orders

## 2022-02-08 ENCOUNTER — Other Ambulatory Visit: Payer: Self-pay

## 2022-02-08 ENCOUNTER — Ambulatory Visit (HOSPITAL_COMMUNITY)
Admission: RE | Admit: 2022-02-08 | Discharge: 2022-02-08 | Disposition: A | Discharge status: Home / Self Care | Payer: Managed Care, Other (non HMO) | Source: Ambulatory Visit | Attending: Physician Assistant | Admitting: Physician Assistant

## 2022-02-08 DIAGNOSIS — I951 Orthostatic hypotension: Secondary | ICD-10-CM | POA: Diagnosis not present

## 2022-02-08 DIAGNOSIS — U099 Post covid-19 condition, unspecified: Secondary | ICD-10-CM | POA: Insufficient documentation

## 2022-02-08 DIAGNOSIS — R079 Chest pain, unspecified: Secondary | ICD-10-CM | POA: Diagnosis not present

## 2022-02-08 DIAGNOSIS — R0609 Other forms of dyspnea: Secondary | ICD-10-CM | POA: Diagnosis not present

## 2022-02-08 LAB — ECHOCARDIOGRAM COMPLETE
Area-P 1/2: 3.99 cm2
Calc EF: 58 %
S' Lateral: 3.7 cm
Single Plane A2C EF: 58.4 %
Single Plane A4C EF: 57.2 %

## 2022-02-08 NOTE — Progress Notes (Signed)
  Echocardiogram 2D Echocardiogram has been performed.  Fidel Levy 02/08/2022, 1:44 PM

## 2022-02-09 ENCOUNTER — Encounter: Payer: Self-pay | Admitting: Physician Assistant

## 2022-02-09 NOTE — Progress Notes (Signed)
Echo looks good. How are you feeling?

## 2022-02-14 ENCOUNTER — Other Ambulatory Visit: Payer: Self-pay | Admitting: Physician Assistant

## 2022-02-14 NOTE — Telephone Encounter (Signed)
Looks like note already in the chart for half days until 02/19/2022.

## 2022-02-16 ENCOUNTER — Encounter: Payer: Self-pay | Admitting: Physician Assistant

## 2022-02-16 NOTE — Telephone Encounter (Signed)
I called patient. She needed the note to say half days until the 23 rd.

## 2022-02-22 ENCOUNTER — Other Ambulatory Visit: Payer: Self-pay | Admitting: Physician Assistant

## 2022-02-22 ENCOUNTER — Ambulatory Visit: Payer: Managed Care, Other (non HMO) | Attending: Cardiology | Admitting: Cardiology

## 2022-02-22 ENCOUNTER — Encounter: Payer: Self-pay | Admitting: Cardiology

## 2022-02-22 VITALS — BP 142/94 | HR 100 | Ht 66.0 in | Wt 175.8 lb

## 2022-02-22 DIAGNOSIS — I10 Essential (primary) hypertension: Secondary | ICD-10-CM

## 2022-02-22 DIAGNOSIS — M5030 Other cervical disc degeneration, unspecified cervical region: Secondary | ICD-10-CM

## 2022-02-22 DIAGNOSIS — R079 Chest pain, unspecified: Secondary | ICD-10-CM | POA: Diagnosis not present

## 2022-02-22 DIAGNOSIS — R072 Precordial pain: Secondary | ICD-10-CM

## 2022-02-22 DIAGNOSIS — Z01812 Encounter for preprocedural laboratory examination: Secondary | ICD-10-CM | POA: Insufficient documentation

## 2022-02-22 DIAGNOSIS — E559 Vitamin D deficiency, unspecified: Secondary | ICD-10-CM

## 2022-02-22 DIAGNOSIS — F39 Unspecified mood [affective] disorder: Secondary | ICD-10-CM

## 2022-02-22 MED ORDER — METOPROLOL TARTRATE 100 MG PO TABS: ORAL_TABLET | ORAL | 0 refills | Status: DC

## 2022-02-22 MED ORDER — PROPRANOLOL HCL 10 MG PO TABS: 10.0000 mg | ORAL_TABLET | Freq: Two times a day (BID) | ORAL | 3 refills | Status: DC

## 2022-02-22 NOTE — Progress Notes (Signed)
Cardiology Office Note:    Date:  02/22/2022   ID:  Alicia Sanders, DOB [birth year], MRN [id number]  PCP:  Donella Stade, PA-C  Cardiologist:  Berniece Salines, DO  Electrophysiologist:  None   Referring MD: Donella Stade, PA-C   " I am not doing well since COVID"  History of Present Illness:    Alicia Sanders is a 42 y.o. female with a hx of hypertension, coronary calcification seen on CT calcium scoring, hypothyroidism, family history CAD with her dad who had a four-vessel CABG in his early 56s and her brother who has his first MI in his 41.   She reported that since her covid infection in Dec 2023 she has been experiencing worsening shortness of breath on exertion.  She notes that she recently also has starting feeling midsternal pressure-like sensation intermittent on exertion with the shortness of breath.  She is concerned because both her father and her brother were both diagnosed with coronary artery disease from an MI.  She notes that her symptoms feel similar to when they reported and was diagnosed.  She also tells me she is experiencing intermittent palpitations abrupt onset of fast heartbeat comes on and off.  She feels dizzy at times when this occurs.  She has not actually passed out but she tells me last Monday she was at work if her coworker was not with her to help her she was going to pass out.  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  She is here with her husband who is also equally concerned about her.  She has 2 teenage children who are healthy, she works as a Heritage manager at [employer].  Past Medical History:  Diagnosis Date   Back problem    Bilateral lower abdominal discomfort 03/05/2018   Bloating 03/05/2018   Change in bowel habits 09/23/2017   Cough 01/23/2017   Eczema    Hypothyroidism    No energy 03/05/2018   Pleuritic chest pain 01/27/2016   Preventive measure 05/08/2012   Weight gain 04/18/2015    Past Surgical History:   Procedure Laterality Date   HAND SURGERY Right    INTRAUTERINE DEVICE INSERTION     LAPAROSCOPIC APPENDECTOMY N/A 01/08/2019   Procedure: APPENDECTOMY LAPAROSCOPIC;  Surgeon: Erroll Luna, MD;  Location: [facility];  Service: General;  Laterality: N/A;    Current Medications: Current Meds  Medication Sig   albuterol (PROVENTIL HFA;VENTOLIN HFA) 108 (90 Base) MCG/ACT inhaler 2 puffs 15 minutes before physical activity and exercise   buPROPion (WELLBUTRIN XL) 300 MG 24 hr tablet Take 1 tablet (300 mg total) by mouth daily.   Clindamycin-Benzoyl Per, Refr, gel APPLY TO AFFECTED AREA TWICE A DAY   dicyclomine (BENTYL) 10 MG capsule TAKE 1 CAPSULE (10 MG) BY MOUTH 3 (THREE) TIMES DAILY AS NEEDED FOR SPASMS.   hydrochlorothiazide (HYDRODIURIL) 25 MG tablet Take 1 tablet (25 mg total) by mouth daily.   levothyroxine (SYNTHROID) 50 MCG tablet TAKE 1 TABLET BY MOUTH EVERY DAY   losartan (COZAAR) 25 MG tablet TAKE 1 TABLET (25 MG TOTAL) BY MOUTH DAILY.   metoprolol tartrate (LOPRESSOR) 100 MG tablet Take 2 hours prior to CT   montelukast (SINGULAIR) 10 MG tablet TAKE 1 TABLET BY MOUTH EVERYDAY AT BEDTIME   mupirocin ointment (BACTROBAN) 2 % Apply topically daily.   ondansetron (ZOFRAN-ODT) 8 MG disintegrating tablet Take 1 tablet (8 mg total) by mouth every 8 (eight) hours as needed for nausea.  oxyCODONE-acetaminophen (PERCOCET/ROXICET) 5-325 MG tablet Take 1 tablet by mouth every 8 (eight) hours as needed. For break through pain.   propranolol (INDERAL) 10 MG tablet Take 1 tablet (10 mg total) by mouth 2 (two) times daily.   Rimegepant Sulfate (NURTEC) 75 MG TBDP Take 1 tablet by mouth as needed. No more than one tablet in 24 hour period.   SUMAtriptan (IMITREX) 100 MG tablet Take 1 tablet (100 mg total) by mouth every 2 (two) hours as needed for migraine. May repeat in 2 hours if headache persists or recurs.   traMADol (ULTRAM) 50 MG tablet TAKE 1 TABLET BY MOUTH THREE TIMES A DAY AS NEEDED    traZODone (DESYREL) 50 MG tablet Take 0.5-1 tablets (25-50 mg total) by mouth at bedtime as needed for sleep.   tretinoin (RETIN-A) 0.05 % cream Apply 1 application  topically daily as needed (ezma).   [DISCONTINUED] diltiazem (CARDIZEM) 30 MG tablet TAKE 1 TABLET (30 MG TOTAL) BY MOUTH 3 (THREE) TIMES DAILY. FOR HR SPIKES AND BLOOD PRESSURE.     Allergies:   Thimerosal (thiomersal), Adhesive [tape], and Hydrocodone   Social History   Socioeconomic History   Marital status: Married    Spouse name: Not on file   Number of children: 2   Years of education: Not on file   Highest education level: Not on file  Occupational History   Occupation: CT tech  Tobacco Use   Smoking status: Never   Smokeless tobacco: Never  Substance and Sexual Activity   Alcohol use: Yes    Comment: occasional   Drug use: Never   Sexual activity: Yes    Birth control/protection: None  Other Topics Concern   Not on file  Social History Narrative   Not on file   Social Determinants of Health   Financial Resource Strain: Not on file  Food Insecurity: Not on file  Transportation Needs: Not on file  Physical Activity: Not on file  Stress: Not on file  Social Connections: Not on file     Family History: The patient's family history includes Diabetes in her maternal grandmother; Endometrial cancer in her mother; Heart disease in her brother, father, and paternal grandfather; Lung cancer in her maternal grandfather. There is no history of Colon cancer, Esophageal cancer, Inflammatory bowel disease, Liver disease, Pancreatic cancer, Rectal cancer, Stomach cancer, or Breast cancer.  ROS:   Review of Systems  Constitution: Negative for decreased appetite, fever and weight gain.  HENT: Negative for congestion, ear discharge, hoarse voice and sore throat.   Eyes: Negative for discharge, redness, vision loss in right eye and visual halos.  Cardiovascular: Negative for chest pain, dyspnea on exertion, leg  swelling, orthopnea and palpitations.  Respiratory: Negative for cough, hemoptysis, shortness of breath and snoring.   Endocrine: Negative for heat intolerance and polyphagia.  Hematologic/Lymphatic: Negative for bleeding problem. Does not bruise/bleed easily.  Skin: Negative for flushing, nail changes, rash and suspicious lesions.  Musculoskeletal: Negative for arthritis, joint pain, muscle cramps, myalgias, neck pain and stiffness.  Gastrointestinal: Negative for abdominal pain, bowel incontinence, diarrhea and excessive appetite.  Genitourinary: Negative for decreased libido, genital sores and incomplete emptying.  Neurological: Negative for brief paralysis, focal weakness, headaches and loss of balance.  Psychiatric/Behavioral: Negative for altered mental status, depression and suicidal ideas.  Allergic/Immunologic: Negative for HIV exposure and persistent infections.    EKGs/Labs/Other Studies Reviewed:    The following studies were reviewed today:   EKG:  The ekg ordered today demonstrates  CT calcium scoring 09/2021 Narrative & Impression  CLINICAL DATA:  CAD screening, intermediate CAD risk, treadmill candidate   * Tracking Code: FCC *   EXAM: CT CARDIAC CORONARY ARTERY CALCIUM SCORE   TECHNIQUE: Non-contrast imaging through the heart was performed using prospective ECG gating. Image post processing was performed on an independent workstation, allowing for quantitative analysis of the heart and coronary arteries. Note that this exam targets the heart and the chest was not imaged in its entirety.   COMPARISON:  06/05/2016   FINDINGS: CORONARY CALCIUM SCORES:   Left Main: 0   LAD: 2.97   LCx: 0   RCA: 1.34   Total Agatston Score: 4.31   MESA database percentile: 95. Please note, this is compared to 62 year olds, the youngest age in the [place].   AORTA MEASUREMENTS:   Ascending Aorta: 31 cm   Descending Aorta:20 cm   OTHER FINDINGS:   Heart  is normal size. Aorta normal caliber. No adenopathy. No confluent opacities or effusions. No acute findings in the upper abdomen. Chest wall soft tissues are unremarkable. No acute bony abnormality.   IMPRESSION: Total Agatston score: 4.31   [place] database percentile: 95 when compared to 13 year olds, the youngest in the [employer].   No acute or significant extracardiac abnormality.     Electronically Signed   By: Rolm Baptise M.D.   On: 09/20/2021 11:18    TTE 02/08/2022 IMPRESSIONS   1. Left ventricular ejection fraction, by estimation, is 55 to 60%. Left  ventricular ejection fraction by 2D MOD biplane is 58.0 %. The left  ventricle has normal function. The left ventricle has no regional wall  motion abnormalities. Left ventricular  diastolic parameters were normal. The average left ventricular global  longitudinal strain is -20.7 %. The global longitudinal strain is normal.   2. Right ventricular systolic function is normal. The right ventricular  size is normal.   3. The mitral valve is grossly normal. Trivial mitral valve  regurgitation.   4. The aortic valve is tricuspid. Aortic valve regurgitation is not  visualized.   Comparison(s): No prior Echocardiogram.   Conclusion(s)/Recommendation(s): Normal biventricular function without  evidence of hemodynamically significant valvular heart disease.   FINDINGS   Left Ventricle: Left ventricular ejection fraction, by estimation, is 55  to 60%. Left ventricular ejection fraction by 2D MOD biplane is 58.0 %.  The left ventricle has normal function. The left ventricle has no regional  wall motion abnormalities. The  average left ventricular global longitudinal strain is -20.7 %. The global  longitudinal strain is normal. The left ventricular internal cavity size  was normal in size. There is no left ventricular hypertrophy. Left  ventricular diastolic parameters were  normal.   Right Ventricle: The right ventricular  size is normal. No increase in  right ventricular wall thickness. Right ventricular systolic function is  normal.   Left Atrium: Left atrial size was normal in size.   Right Atrium: Right atrial size was normal in size.   Pericardium: There is no evidence of pericardial effusion.   Mitral Valve: The mitral valve is grossly normal. Trivial mitral valve  regurgitation.   Tricuspid Valve: The tricuspid valve is grossly normal. Tricuspid valve  regurgitation is not demonstrated.   Aortic Valve: The aortic valve is tricuspid. Aortic valve regurgitation is  not visualized.   Pulmonic Valve: The pulmonic valve was normal in structure. Pulmonic valve  regurgitation is not visualized.   Aorta: The aortic root  and ascending aorta are structurally normal, with  no evidence of dilitation.   IAS/Shunts: No atrial level shunt detected by color flow Doppler.   Recent Labs: 01/12/2022: ALT 33; BUN 12; Creat 0.71; Potassium 4.0; Sodium 137; TSH 1.12 01/18/2022: Hemoglobin 14.8; Platelets 295  Recent Lipid Panel    Component Value Date/Time   CHOL 163 10/04/2021 0000   TRIG 61 10/04/2021 0000   HDL 44 (L) 10/04/2021 0000   CHOLHDL 3.7 10/04/2021 0000   VLDL 9 09/27/2015 0811   LDLCALC 104 (H) 10/04/2021 0000    Physical Exam:    VS:  BP (!) 142/94   Pulse 100   Ht 5' 6"$  (1.676 m)   Wt 79.7 kg   SpO2 99%   BMI 28.37 kg/m     Wt Readings from Last 3 Encounters:  02/22/22 79.7 kg  01/26/22 75.3 kg  01/03/22 74.9 kg     GEN: Well nourished, well developed in no acute distress HEENT: Normal NECK: No JVD; No carotid bruits LYMPHATICS: No lymphadenopathy CARDIAC: S1S2 noted,RRR, no murmurs, rubs, gallops RESPIRATORY:  Clear to auscultation without rales, wheezing or rhonchi  ABDOMEN: Soft, non-tender, non-distended, +bowel sounds, no guarding. EXTREMITIES: No edema, No cyanosis, no clubbing MUSCULOSKELETAL:  No deformity  SKIN: Warm and dry NEUROLOGIC:  Alert and oriented x 3,  non-focal PSYCHIATRIC:  Normal affect, good insight  ASSESSMENT:    1. Precordial pain   2. Chest pain of uncertain etiology   3. Pre-procedure lab exam   4. Vitamin D deficiency   5. Primary hypertension    PLAN:     Review of her echocardiogram as well as her CT calcium scoring.  Her echo is normal.  But at this point the CT calcium scoring really does not give [place] the information that we really need given the fact that the patient is symptomatic with chest discomfort that is concerning as well as dyspnea on exertion.  With her family history and symptoms the best thing to do is to coronary CTA will help [place] understand if there is any soft plaques that may be impeding her coronary artery flow.  I spoke to the patient about this she is in agreement.  No contrast allergy.  With her palpitations and increased heart rate even at rest with recent COVID infection suspect autonomic dysfunction may be playing a role here.  I like to do a trial of low-dose propranolol 10 mg twice daily.  She is agreeable to do this. I also asked the patient to get [facility] to help [place] with getting her EKG during the time of her symptoms.  I advised that she get abdominal binder as well as support stockings if she is going to be standing at work.  He is hypertensive in the office today will help that with the addition of the propranolol that would help a little bit.  I will keep her off on her hydrochlorothiazide 25 mg daily as well as the losartan.   The patient is in agreement with the above plan. The patient left the office in stable condition.  The patient will follow up in   Medication Adjustments/Labs and Tests Ordered: Current medicines are reviewed at length with the patient today.  Concerns regarding medicines are outlined above.  Orders Placed This Encounter  Procedures   CT CORONARY MORPH W/CTA COR W/SCORE W/CA W/CM &/OR WO/CM   Basic Metabolic Panel (BMET)   Magnesium   CBC with  Differential/Platelet   VITAMIN D 25  Hydroxy (Vit-D Deficiency, Fractures)   Meds ordered this encounter  Medications   metoprolol tartrate (LOPRESSOR) 100 MG tablet    Sig: Take 2 hours prior to CT    Dispense:  1 tablet    Refill:  0   propranolol (INDERAL) 10 MG tablet    Sig: Take 1 tablet (10 mg total) by mouth 2 (two) times daily.    Dispense:  180 tablet    Refill:  3    Patient Instructions  Medication Instructions:  Your physician has recommended you make the following change in your medication:  STOP: Cardizem START: Propranolol 10 mg twice daily  *If you need a refill on your cardiac medications before your next appointment, please call your pharmacy*   Lab Work: Your physician recommends that you have labs drawn today: BMET, Mag, CBC, Vit D If you have labs (blood work) drawn today and your tests are completely normal, you will receive your results only by: [place] (if you have MyChart) OR A paper copy in the mail If you have any lab test that is abnormal or we need to change your treatment, we will call you to review the results.   Testing/Procedures:   Your cardiac CT will be scheduled at one of the below locations:   [facility] [address] [place], [place] [address] [phone]   If scheduled at [facility], please arrive at the [facility] and Children's Entrance (Entrance C2) of [facility] 30 minutes prior to test start time. You can use the FREE valet parking offered at entrance C (encouraged to control the heart rate for the test)  Proceed to the [facility] Radiology Department (first floor) to check-in and test prep.  All radiology patients and guests should use entrance C2 at [facility], accessed from [facility], even though the hospital's physical address listed is [address].      Please follow these instructions carefully (unless otherwise directed):   On the Night  Before the Test: Be sure to Drink plenty of water. Do not consume any caffeinated/decaffeinated beverages or chocolate 12 hours prior to your test. Do not take any antihistamines 12 hours prior to your test.  On the Day of the Test: Drink plenty of water until 1 hour prior to the test. Do not eat any food 1 hour prior to test. You may take your regular medications prior to the test.  Take metoprolol (Lopressor) two hours prior to test. If you take Hydrochlorothiazide/Losartan please HOLD on the morning of the test. FEMALES- please wear underwire-free bra if available, avoid dresses & tight clothing       After the Test: Drink plenty of water. After receiving IV contrast, you may experience a mild flushed feeling. This is normal. On occasion, you may experience a mild rash up to 24 hours after the test. This is not dangerous. If this occurs, you can take Benadryl 25 mg and increase your fluid intake. If you experience trouble breathing, this can be serious. If it is severe call 911 IMMEDIATELY. If it is mild, please call our office. If you take any of these medications: Glipizide/Metformin, Avandament, Glucavance, please do not take 48 hours after completing test unless otherwise instructed.  We will call to schedule your test 2-4 weeks out understanding that some insurance companies will need an authorization prior to the service being performed.   For non-scheduling related questions, please contact the cardiac imaging nurse navigator should  you have any questions/concerns: Marchia Bond, Cardiac Imaging Nurse Navigator Gordy Clement, Cardiac Imaging Nurse Navigator  Heart [facility] V[facility] Services Direct Office Dial: [phone]   For scheduling needs, including cancellations and rescheduling, please call [place], [phone].    Follow-Up: At [facility], you and your health needs are our priority.  As part of our continuing mission to provide you with  exceptional heart care, we have created designated Provider Care Teams.  These Care Teams include your primary Cardiologist (physician) and Advanced Practice Providers (APPs -  Physician Assistants and Nurse Practitioners) who all work together to provide you with the care you need, when you need it.  We recommend signing up for the patient portal called "MyChart".  Sign up information is provided on this After Visit Summary.  MyChart is used to connect with patients for Virtual Visits (Telemedicine).  Patients are able to view lab/test results, encounter notes, upcoming appointments, etc.  Non-urgent messages can be sent to your provider as well.   To learn more about what you can do with MyChart, go to [email or web address].    Your next appointment:   12 week(s)  Provider:   Berniece Salines, DO      Other Instructions: Please wear an abdominal binder daily and take it off at bedtime.   PLEASE PURCHASE AND WEAR COMPRESSION STOCKINGS DAILY AND TAKE OFF AT BEDTIME.  I recommend getting support socks/stockings. 10 mmHg is the preferred amount of compression.    Compression stockings are elastic socks that squeeze the legs. They help to increase blood flow to the legs and to decrease swelling in the legs from fluid retention, and reduce the chance of developing blood clots in the lower legs. Please put on in the AM when dressing and off at night when dressing for bed.   [employer]  [place] ([phone]); [place], [place] [address]; [phone]  EMAIL:   [email or web address].     [email or web address] [email or web address]        FDA-cleared, clinical grade mobile EKG monitor: Jodelle Red is the most clinically-validated mobile EKG used by the world's leading cardiac care medical professionals With Basic service, know instantly if your heart rhythm is normal or if atrial fibrillation is detected, and email the last single EKG recording to yourself  or your doctor Premium service, available for purchase through the [employer] app for $9.99 per month or $99 per year, includes unlimited history and storage of your EKG recordings, a monthly EKG summary report to share with your doctor, along with the ability to track your blood pressure, activity and weight Includes one [employer] phone clip FREE SHIPPING: Standard delivery 1-3 business days. Orders placed by 11:00am PST will ship that afternoon. Otherwise, will ship next business day. All orders ship via [employer] from [place], [place] - sending an EKG Download app and set up profile. Run EKG - by placing 1-2 fingers on the silver plates After EKG is complete - Download PDF  - Skip password (if you apply a password the provider will need it to view the EKG) Click share button (square with upward arrow) in bottom left corner To send: choose MyChart (first time log into MyChart)  Pop up window about sending ECG Click continue Choose type of message Choose provider Type subject and message Click send (EKG should be attached)  - To send additional EKGs in one message click the paperclip image and bottom of page to attach.  Adopting a Healthy Lifestyle.  Know what a healthy weight is for you (roughly BMI <25) and aim to maintain this   Aim for 7+ servings of fruits and vegetables daily   65-80+ fluid ounces of water or unsweet tea for healthy kidneys   Limit to max 1 drink of alcohol per day; avoid smoking/tobacco   Limit animal fats in diet for cholesterol and heart health - choose grass fed whenever available   Avoid highly processed foods, and foods high in saturated/trans fats   Aim for low stress - take time to unwind and care for your mental health   Aim for 150 min of moderate intensity exercise weekly for heart health, and weights twice weekly for bone health   Aim for 7-9 hours of sleep daily   When it comes to diets, agreement about the perfect  plan isnt easy to find, even among the experts. Experts at the [employer] developed an idea known as the Healthy Eating Plate. Just imagine a plate divided into logical, healthy portions.   The emphasis is on diet quality:   Load up on vegetables and fruits - one-half of your plate: Aim for color and variety, and remember that potatoes dont count.   Go for whole grains - one-quarter of your plate: Whole wheat, barley, wheat berries, quinoa, oats, brown rice, and foods made with them. If you want pasta, go with whole wheat pasta.   Protein power - one-quarter of your plate: Fish, chicken, beans, and nuts are all healthy, versatile protein sources. Limit red meat.   The diet, however, does go beyond the plate, offering a few other suggestions.   Use healthy plant oils, such as olive, canola, soy, corn, sunflower and peanut. Check the labels, and avoid partially hydrogenated oil, which have unhealthy trans fats.   If youre thirsty, drink water. Coffee and tea are good in moderation, but skip sugary drinks and limit milk and dairy products to one or two daily servings.   The type of carbohydrate in the diet is more important than the amount. Some sources of carbohydrates, such as vegetables, fruits, whole grains, and beans-are healthier than others.   Finally, stay active  Signed, Berniece Salines, DO  02/22/2022 10:02 AM    [place]

## 2022-02-22 NOTE — Patient Instructions (Addendum)
Medication Instructions:  Your physician has recommended you make the following change in your medication:  STOP: Cardizem START: Propranolol 10 mg twice daily  *If you need a refill on your cardiac medications before your next appointment, please call your pharmacy*   Lab Work: Your physician recommends that you have labs drawn today: BMET, Mag, CBC, Vit D If you have labs (blood work) drawn today and your tests are completely normal, you will receive your results only by: [place] (if you have MyChart) OR A paper copy in the mail If you have any lab test that is abnormal or we need to change your treatment, we will call you to review the results.   Testing/Procedures:   Your cardiac CT will be scheduled at one of the below locations:   [facility] [address] [place], [place] [address] [phone]   If scheduled at [facility], please arrive at the [facility] Entrance (Entrance C2) of [facility] 30 minutes prior to test start time. You can use the FREE valet parking offered at entrance C (encouraged to control the heart rate for the test)  Proceed to the [facility] Radiology Department (first floor) to check-in and test prep.  All radiology patients and guests should use entrance C2 at [facility], accessed from [facility], even though the hospital's physical address listed is [address].      Please follow these instructions carefully (unless otherwise directed):   On the Night Before the Test: Be sure to Drink plenty of water. Do not consume any caffeinated/decaffeinated beverages or chocolate 12 hours prior to your test. Do not take any antihistamines 12 hours prior to your test.  On the Day of the Test: Drink plenty of water until 1 hour prior to the test. Do not eat any food 1 hour prior to test. You may take your regular medications prior to the test.  Take metoprolol  (Lopressor) two hours prior to test. If you take Hydrochlorothiazide/Losartan please HOLD on the morning of the test. FEMALES- please wear underwire-free bra if available, avoid dresses & tight clothing       After the Test: Drink plenty of water. After receiving IV contrast, you may experience a mild flushed feeling. This is normal. On occasion, you may experience a mild rash up to 24 hours after the test. This is not dangerous. If this occurs, you can take Benadryl 25 mg and increase your fluid intake. If you experience trouble breathing, this can be serious. If it is severe call 911 IMMEDIATELY. If it is mild, please call our office. If you take any of these medications: Glipizide/Metformin, Avandament, Glucavance, please do not take 48 hours after completing test unless otherwise instructed.  We will call to schedule your test 2-4 weeks out understanding that some insurance companies will need an authorization prior to the service being performed.   For non-scheduling related questions, please contact the cardiac imaging nurse navigator should you have any questions/concerns: Marchia Bond, Cardiac Imaging Nurse Navigator Gordy Clement, Cardiac Imaging Nurse Navigator [facility] Direct Office Dial: [phone]   For scheduling needs, including cancellations and rescheduling, please call [place], [phone].    Follow-Up: At [facility], you and your health needs are our priority.  As part of our continuing mission to provide you with exceptional heart care, we have created designated Provider Care Teams.  These Care Teams include your primary Cardiologist (physician)  and Advanced Practice Providers (APPs -  Physician Assistants and Nurse Practitioners) who all work together to provide you with the care you need, when you need it.  We recommend signing up for the patient portal called "MyChart".  Sign up information is provided on this After  Visit Summary.  MyChart is used to connect with patients for Virtual Visits (Telemedicine).  Patients are able to view lab/test results, encounter notes, upcoming appointments, etc.  Non-urgent messages can be sent to your provider as well.   To learn more about what you can do with MyChart, go to [email or web address].    Your next appointment:   12 week(s)  Provider:   Berniece Salines, DO      Other Instructions: Please wear an abdominal binder daily and take it off at bedtime.   PLEASE PURCHASE AND WEAR COMPRESSION STOCKINGS DAILY AND TAKE OFF AT BEDTIME.  I recommend getting support socks/stockings. 10 mmHg is the preferred amount of compression.    Compression stockings are elastic socks that squeeze the legs. They help to increase blood flow to the legs and to decrease swelling in the legs from fluid retention, and reduce the chance of developing blood clots in the lower legs. Please put on in the AM when dressing and off at night when dressing for bed.   [employer];  [place] ([place] [phone]); [place], [place] [phone]; [phone]  EMAIL:   [email or web address].     KardiaMobile [email or web address]        FDA-cleared, clinical grade mobile EKG monitor: Jodelle Red is the most clinically-validated mobile EKG used by the world's leading cardiac care medical professionals With Basic service, know instantly if your heart rhythm is normal or if atrial fibrillation is detected, and email the last single EKG recording to yourself or your doctor Premium service, available for purchase through the K[employer]ia app for $9.99 per month or $99 per year, includes unlimited history and storage of your EKG recordings, a monthly EKG summary report to share with your doctor, along with the ability to track your blood pressure, activity and weight Includes one [employer] phone clip FREE SHIPPING: Standard delivery 1-3 business days. Orders placed by  11:00am PST will ship that afternoon. Otherwise, will ship next business day. All orders ship via [employer] from [place], [place] - sending an EKG Download app and set up profile. Run EKG - by placing 1-2 fingers on the silver plates After EKG is complete - Download PDF  - Skip password (if you apply a password the provider will need it to view the EKG) Click share button (square with upward arrow) in bottom left corner To send: choose MyChart (first time log into MyChart)  Pop up window about sending ECG Click continue Choose type of message Choose provider Type subject and message Click send (EKG should be attached)  - To send additional EKGs in one message click the paperclip image and bottom of page to attach.

## 2022-02-23 ENCOUNTER — Encounter: Payer: Self-pay | Admitting: Physician Assistant

## 2022-02-23 DIAGNOSIS — F39 Unspecified mood [affective] disorder: Secondary | ICD-10-CM

## 2022-02-23 LAB — BASIC METABOLIC PANEL
BUN/Creatinine Ratio: 13 (ref 9–23)
BUN: 10 mg/dL (ref 6–24)
CO2: 24 mmol/L (ref 20–29)
Calcium: 9.3 mg/dL (ref 8.7–10.2)
Chloride: 102 mmol/L (ref 96–106)
Creatinine, Ser: 0.79 mg/dL (ref 0.57–1.00)
Glucose: 82 mg/dL (ref 70–99)
Potassium: 4 mmol/L (ref 3.5–5.2)
Sodium: 139 mmol/L (ref 134–144)
eGFR: 96 mL/min/{1.73_m2} (ref >59)

## 2022-02-23 LAB — CBC WITH DIFFERENTIAL/PLATELET
Basophils Absolute: 0 10*3/uL (ref 0.0–0.2)
Basos: 0 %
EOS (ABSOLUTE): 0.1 10*3/uL (ref 0.0–0.4)
Eos: 1 %
Hematocrit: 41.2 % (ref 34.0–46.6)
Hemoglobin: 13.8 g/dL (ref 11.1–15.9)
Immature Grans (Abs): 0 10*3/uL (ref 0.0–0.1)
Immature Granulocytes: 0 %
Lymphocytes Absolute: 2.8 10*3/uL (ref 0.7–3.1)
Lymphs: 33 %
MCH: 29.6 pg (ref 26.6–33.0)
MCHC: 33.5 g/dL (ref 31.5–35.7)
MCV: 88 fL (ref 79–97)
Monocytes Absolute: 0.5 10*3/uL (ref 0.1–0.9)
Monocytes: 5 %
Neutrophils Absolute: 5.3 10*3/uL (ref 1.4–7.0)
Neutrophils: 61 %
Platelets: 297 10*3/uL (ref 150–450)
RBC: 4.66 x10E6/uL (ref 3.77–5.28)
RDW: 13.9 % (ref 11.7–15.4)
WBC: 8.7 10*3/uL (ref 3.4–10.8)

## 2022-02-23 LAB — MAGNESIUM: Magnesium: 1.9 mg/dL (ref 1.6–2.3)

## 2022-02-23 LAB — VITAMIN D 25 HYDROXY (VIT D DEFICIENCY, FRACTURES): Vit D, 25-Hydroxy: 31.6 ng/mL (ref 30.0–100.0)

## 2022-02-23 MED ORDER — BUPROPION HCL ER (XL) 300 MG PO TB24: 300.0000 mg | ORAL_TABLET | Freq: Every day | ORAL | 3 refills | Status: DC

## 2022-02-23 NOTE — Telephone Encounter (Signed)
Tramadol last written 10/20/2021 #270 with no refills   Last visit 01/26/2022

## 2022-02-24 ENCOUNTER — Other Ambulatory Visit: Payer: Self-pay | Admitting: Physician Assistant

## 2022-02-28 ENCOUNTER — Encounter: Payer: Self-pay | Admitting: Cardiology

## 2022-03-01 ENCOUNTER — Telehealth (HOSPITAL_COMMUNITY): Payer: Self-pay | Admitting: *Deleted

## 2022-03-01 ENCOUNTER — Telehealth (HOSPITAL_COMMUNITY): Payer: Self-pay | Admitting: Emergency Medicine

## 2022-03-01 NOTE — Telephone Encounter (Signed)
Attempted to call patient regarding upcoming cardiac CT appointment. °Left message on voicemail with name and callback number °Reginold Beale RN Navigator Cardiac Imaging °[facility] °[phone] Office °[phone] Cell ° °

## 2022-03-01 NOTE — Telephone Encounter (Signed)
Patient returning call about her upcoming cardiac imaging study; pt verbalizes understanding of appt date/time, parking situation and where to check in, pre-test NPO status and medications ordered, and verified current allergies; name and call back number provided for further questions should they arise  Gordy Clement RN Navigator Cardiac Imaging [facility] [phone] office [phone] cell  Patient to take '100mg'$  metoprolol tartrate two hours prior to her cardiac CT scan. She is aware to arrive at 7:30am.

## 2022-03-02 ENCOUNTER — Ambulatory Visit (HOSPITAL_COMMUNITY)
Admission: RE | Admit: 2022-03-02 | Discharge: 2022-03-02 | Disposition: A | Discharge status: Home / Self Care | Payer: Managed Care, Other (non HMO) | Source: Ambulatory Visit | Attending: Cardiology

## 2022-03-02 ENCOUNTER — Other Ambulatory Visit (HOSPITAL_COMMUNITY): Payer: Managed Care, Other (non HMO)

## 2022-03-02 DIAGNOSIS — R072 Precordial pain: Secondary | ICD-10-CM | POA: Insufficient documentation

## 2022-03-02 MED ORDER — NITROGLYCERIN 0.4 MG SL SUBL
SUBLINGUAL_TABLET | SUBLINGUAL | Status: AC
  Filled 2022-03-02: qty 2

## 2022-03-02 MED ORDER — IOHEXOL 350 MG/ML SOLN
100.0000 mL | Freq: Once | INTRAVENOUS | Status: AC | PRN
  Administered 2022-03-02: 100 mL via INTRAVENOUS

## 2022-03-02 MED ORDER — NITROGLYCERIN 0.4 MG SL SUBL
0.8000 mg | SUBLINGUAL_TABLET | Freq: Once | SUBLINGUAL | Status: AC
  Administered 2022-03-02: 0.8 mg via SUBLINGUAL

## 2022-03-04 ENCOUNTER — Encounter: Payer: Self-pay | Admitting: Physician Assistant

## 2022-03-05 ENCOUNTER — Other Ambulatory Visit: Payer: Self-pay | Admitting: Physician Assistant

## 2022-03-05 ENCOUNTER — Encounter: Payer: Self-pay | Admitting: Cardiology

## 2022-03-05 DIAGNOSIS — M25552 Pain in left hip: Secondary | ICD-10-CM

## 2022-03-05 MED ORDER — OXYCODONE-ACETAMINOPHEN 5-325 MG PO TABS: 1.0000 | ORAL_TABLET | Freq: Three times a day (TID) | ORAL | 0 refills | Status: DC | PRN

## 2022-03-05 NOTE — Progress Notes (Signed)
..  PDMP reviewed during this encounter. No concerns Last oxycodone refilled 11/2021.  Chronic left hip pain

## 2022-03-06 ENCOUNTER — Other Ambulatory Visit: Payer: Self-pay

## 2022-03-06 ENCOUNTER — Encounter: Payer: Self-pay | Admitting: Cardiology

## 2022-03-06 MED ORDER — ROSUVASTATIN CALCIUM 5 MG PO TABS: 5.0000 mg | ORAL_TABLET | Freq: Every day | ORAL | 3 refills | Status: DC

## 2022-03-10 ENCOUNTER — Encounter: Payer: Self-pay | Admitting: Physician Assistant

## 2022-03-16 ENCOUNTER — Telehealth: Payer: Self-pay

## 2022-03-16 ENCOUNTER — Encounter: Payer: Self-pay | Admitting: Cardiology

## 2022-03-16 ENCOUNTER — Telehealth (INDEPENDENT_AMBULATORY_CARE_PROVIDER_SITE_OTHER): Payer: Managed Care, Other (non HMO) | Admitting: Cardiology

## 2022-03-16 VITALS — BP 126/75 | HR 91 | Ht 66.0 in | Wt 175.0 lb

## 2022-03-16 DIAGNOSIS — E782 Mixed hyperlipidemia: Secondary | ICD-10-CM | POA: Diagnosis not present

## 2022-03-16 DIAGNOSIS — R002 Palpitations: Secondary | ICD-10-CM | POA: Diagnosis not present

## 2022-03-16 DIAGNOSIS — I2510 Atherosclerotic heart disease of native coronary artery without angina pectoris: Secondary | ICD-10-CM | POA: Diagnosis not present

## 2022-03-16 DIAGNOSIS — I10 Essential (primary) hypertension: Secondary | ICD-10-CM

## 2022-03-16 MED ORDER — PROPRANOLOL HCL 20 MG PO TABS: 20.0000 mg | ORAL_TABLET | Freq: Two times a day (BID) | ORAL | 3 refills | Status: DC

## 2022-03-16 NOTE — Progress Notes (Signed)
Virtual Visit via Video Note   Because of Alicia Sanders's co-morbid illnesses, she is at least at moderate risk for complications without adequate follow up.  This format is felt to be most appropriate for this patient at this time.  All issues noted in this document were discussed and addressed.  A limited physical exam was performed with this format.  Please refer to the patient's chart for her consent to telehealth for [facility].    Date:  03/16/2022   ID:  Alicia Sanders, DOB October 04, 1980, MRN [id number]  Patient Location: Home Provider Location: Office/Clinic  Virtual Visit via Video  Note . I connected with the patient today by a   video enabled telemedicine application and verified that I am speaking with the correct person using two identifiers.  Virtual Visit via telephone  Note . I connected with the patient today by a telephone enabled telemedicine application and verified that I am speaking with the correct person using two identifiers.  PCP:  Donella Stade, PA-C  Cardiologist:  Berniece Salines, DO  Electrophysiologist:  None   Evaluation Performed:  Follow-Up Visit  Chief Complaint:  " I am still having palpations"  History of Present Illness:    Alicia Sanders is a 42 y.o. female with hypertension, minimal coronary artery disease seen on coronary CTA, hypothyroidism, recent COVID-19 infection resolved.  I saw the patient February 22, 2022 at that time she reported that she was experiencing significant shortness of breath and exertion.  His symptoms she notes that since she had been exposed and had a COVID infection she post-COVID infection show experiencing intermittent palpitations.  She reported dizziness and fatigue.  As well as prior to her visit with me she was noted to have orthostatic hypotension.  At her last visit I suspected likely almost normal dysfunction due to her COVID-19 infection.  For the palpitation that started the patient on a  trial of propranolol 10 mg twice a day.  I asked her to get a Kardia mobile.  I also placed the coronary CTA for the patient due to her symptoms of significant dyspnea and coronary calcium.  Since that visit she has had intermittent palpitations.  She tells me she is gradually improving.  She is here today to discuss her testing result.   The patient does not have symptoms concerning for COVID-19 infection (fever, chills, cough, or new shortness of breath).    Past Medical History:  Diagnosis Date   Back problem    Bilateral lower abdominal discomfort 03/05/2018   Bloating 03/05/2018   Change in bowel habits 09/23/2017   Cough 01/23/2017   Eczema    Hypothyroidism    No energy 03/05/2018   Pleuritic chest pain 01/27/2016   Preventive measure 05/08/2012   Weight gain 04/18/2015   Past Surgical History:  Procedure Laterality Date   HAND SURGERY Right    INTRAUTERINE DEVICE INSERTION     LAPAROSCOPIC APPENDECTOMY N/A 01/08/2019   Procedure: APPENDECTOMY LAPAROSCOPIC;  Surgeon: Erroll Luna, MD;  Location: [facility];  Service: General;  Laterality: N/A;     Current Meds  Medication Sig   albuterol (PROVENTIL HFA;VENTOLIN HFA) 108 (90 Base) MCG/ACT inhaler 2 puffs 15 minutes before physical activity and exercise   buPROPion (WELLBUTRIN XL) 300 MG 24 hr tablet Take 1 tablet (300 mg total) by mouth daily.   Clindamycin-Benzoyl Per, Refr, gel APPLY TO AFFECTED AREA TWICE A DAY   dicyclomine (BENTYL) 10 MG capsule TAKE 1  CAPSULE (10 MG) BY MOUTH 3 (THREE) TIMES DAILY AS NEEDED FOR SPASMS.   hydrochlorothiazide (HYDRODIURIL) 25 MG tablet Take 1 tablet (25 mg total) by mouth daily.   levothyroxine (SYNTHROID) 50 MCG tablet TAKE 1 TABLET BY MOUTH EVERY DAY   losartan (COZAAR) 25 MG tablet TAKE 1 TABLET (25 MG TOTAL) BY MOUTH DAILY.   montelukast (SINGULAIR) 10 MG tablet TAKE 1 TABLET BY MOUTH EVERYDAY AT BEDTIME   mupirocin ointment (BACTROBAN) 2 % Apply topically daily.   ondansetron (ZOFRAN-ODT) 8  MG disintegrating tablet Take 1 tablet (8 mg total) by mouth every 8 (eight) hours as needed for nausea.   oxyCODONE-acetaminophen (PERCOCET/ROXICET) 5-325 MG tablet Take 1 tablet by mouth every 8 (eight) hours as needed. For break through pain.   propranolol (INDERAL) 20 MG tablet Take 1 tablet (20 mg total) by mouth 2 (two) times daily.   Rimegepant Sulfate (NURTEC) 75 MG TBDP Take 1 tablet by mouth as needed. No more than one tablet in 24 hour period.   rosuvastatin (CRESTOR) 5 MG tablet Take 1 tablet (5 mg total) by mouth daily.   SUMAtriptan (IMITREX) 100 MG tablet Take 1 tablet (100 mg total) by mouth every 2 (two) hours as needed for migraine. May repeat in 2 hours if headache persists or recurs.   traMADol (ULTRAM) 50 MG tablet TAKE 1 TABLET BY MOUTH THREE TIMES A DAY AS NEEDED   traZODone (DESYREL) 50 MG tablet Take 0.5-1 tablets (25-50 mg total) by mouth at bedtime as needed for sleep.   tretinoin (RETIN-A) 0.05 % cream Apply 1 application  topically daily as needed (ezma).   [DISCONTINUED] metoprolol tartrate (LOPRESSOR) 100 MG tablet Take 2 hours prior to CT   [DISCONTINUED] propranolol (INDERAL) 10 MG tablet Take 1 tablet (10 mg total) by mouth 2 (two) times daily.     Allergies:   Thimerosal (thiomersal), Adhesive [tape], and Hydrocodone   Social History   Tobacco Use   Smoking status: Never   Smokeless tobacco: Never  Substance Use Topics   Alcohol use: Yes    Comment: occasional   Drug use: Never     Family Hx: The patient's family history includes Diabetes in her maternal grandmother; Endometrial cancer in her mother; Heart disease in her brother, father, and paternal grandfather; Lung cancer in her maternal grandfather. There is no history of Colon cancer, Esophageal cancer, Inflammatory bowel disease, Liver disease, Pancreatic cancer, Rectal cancer, Stomach cancer, or Breast cancer.  ROS:   Please see the history of present illness.    Palpitations All other systems  reviewed and are negative.   Prior CV studies:   The following studies were reviewed today:  03/02/2022 Coronary Arteries:  Normal coronary origin.  Right dominance.   RCA is a large dominant artery that gives rise to PDA and PLA. There is no plaque.   Left main is a large artery that gives rise to LAD and LCX arteries. Mixed plaque in distal left main causes 0-24% stenosis   LAD is a large vessel. Mixed plaque in the proximal LAD causes 0-24% stenosis. Mixed plaque in mid LAD causes minimal (0-24%) stenosis   LCX is a non-dominant artery that gives rise to one large OM1 branch. There is no plaque.   Other findings:   Left Ventricle: Normal size   Left Atrium: Normal size   Pulmonary Veins: Normal configuration   Right Ventricle: Normal size   Right Atrium: Normal size   Cardiac valves: No calcifications   Thoracic  aorta: Normal size   Pulmonary Arteries: Normal size   Systemic Veins: Normal drainage   Pericardium: Normal thickness   IMPRESSION: 1. Coronary calcium score of 5. Percentile not available for age less than 48, but would be 95th percentile for age 31   2.  Normal coronary origin with right dominance.   3. Nonobstructive CAD, with mixed plaque in distal left main/proximal LAD and mixed plaque in mid LAD causing minimal (0-24%) stenosis   CAD-RADS 1. Minimal non-obstructive CAD (0-24%). Consider non-atherosclerotic causes of chest pain. Consider preventive therapy and risk factor modification.   Electronically Signed: By: Oswaldo Milian M.D. On: 03/02/2022 10:33 TTE 02/08/2022 IMPRESSIONS   1. Left ventricular ejection fraction, by estimation, is 55 to 60%. Left  ventricular ejection fraction by 2D MOD biplane is 58.0 %. The left  ventricle has normal function. The left ventricle has no regional wall  motion abnormalities. Left ventricular  diastolic parameters were normal. The average left ventricular global  longitudinal strain is  -20.7 %. The global longitudinal strain is normal.   2. Right ventricular systolic function is normal. The right ventricular  size is normal.   3. The mitral valve is grossly normal. Trivial mitral valve  regurgitation.   4. The aortic valve is tricuspid. Aortic valve regurgitation is not  visualized.   Comparison(s): No prior Echocardiogram.   Conclusion(s)/Recommendation(s): Normal biventricular function without  evidence of hemodynamically significant valvular heart disease.   FINDINGS   Left Ventricle: Left ventricular ejection fraction, by estimation, is 55  to 60%. Left ventricular ejection fraction by 2D MOD biplane is 58.0 %.  The left ventricle has normal function. The left ventricle has no regional  wall motion abnormalities. The  average left ventricular global longitudinal strain is -20.7 %. The global  longitudinal strain is normal. The left ventricular internal cavity size  was normal in size. There is no left ventricular hypertrophy. Left  ventricular diastolic parameters were  normal.   Right Ventricle: The right ventricular size is normal. No increase in  right ventricular wall thickness. Right ventricular systolic function is  normal.   Left Atrium: Left atrial size was normal in size.   Right Atrium: Right atrial size was normal in size.   Pericardium: There is no evidence of pericardial effusion.   Mitral Valve: The mitral valve is grossly normal. Trivial mitral valve  regurgitation.   Tricuspid Valve: The tricuspid valve is grossly normal. Tricuspid valve  regurgitation is not demonstrated.   Aortic Valve: The aortic valve is tricuspid. Aortic valve regurgitation is  not visualized.   Pulmonic Valve: The pulmonic valve was normal in structure. Pulmonic valve  regurgitation is not visualized.   Aorta: The aortic root and ascending aorta are structurally normal, with  no evidence of dilitation.   IAS/Shunts: No atrial level shunt detected by color  flow Doppler.   Labs/Other Tests and Data Reviewed:    EKG:  No ECG reviewed.  Recent Labs: 01/12/2022: ALT 33; TSH 1.12 02/22/2022: BUN 10; Creatinine, Ser 0.79; Hemoglobin 13.8; Magnesium 1.9; Platelets 297; Potassium 4.0; Sodium 139   Recent Lipid Panel Lab Results  Component Value Date/Time   CHOL 163 10/04/2021 12:00 AM   TRIG 61 10/04/2021 12:00 AM   HDL 44 (L) 10/04/2021 12:00 AM   CHOLHDL 3.7 10/04/2021 12:00 AM   LDLCALC 104 (H) 10/04/2021 12:00 AM    Wt Readings from Last 3 Encounters:  03/16/22 79.4 kg  02/22/22 79.7 kg  01/26/22 75.3 kg  Objective:    Vital Signs:  BP 126/75   Pulse 91   Ht 5\' 6"  (1.676 m)   Wt 79.4 kg   BMI 28.25 kg/m      ASSESSMENT & PLAN:    Minimal coronary artery disease  Hyperlipidemia Palpitations  Hypertension   She still is having palpitations.  She has some relief with the propranolol.  Some clinical increase that to 20 mg twice a day.  We talked about the CT scan result which showed minimal coronary artery disease and the implications for this for the future in the benefit for primary prevention.  She is agreeable to continue the Crestor 5 mg daily.  Hyperlipidemia - continue with current statin medication.  Blood pressure is acceptable, continue with current antihypertensive regimen.  COVID-19 Education: The signs and symptoms of COVID-19 were discussed with the patient and how to seek care for testing (follow up with PCP or arrange E-visit).  The importance of social distancing was discussed today.  Time:   Today, I have spent 25 minutes with the patient with telehealth technology discussing the above problems.     Medication Adjustments/Labs and Tests Ordered: Current medicines are reviewed at length with the patient today.  Concerns regarding medicines are outlined above.   Tests Ordered: No orders of the defined types were placed in this encounter.   Medication Changes: Meds ordered this encounter   Medications   propranolol (INDERAL) 20 MG tablet    Sig: Take 1 tablet (20 mg total) by mouth 2 (two) times daily.    Dispense:  180 tablet    Refill:  3    Follow Up:  In Person in 6 month(s)  Signed, Berniece Salines, DO  03/16/2022 10:42 PM    [facility]

## 2022-03-16 NOTE — Telephone Encounter (Signed)
Called pt. Went over AVS. Appt made. Prescription sent to pharmacy.

## 2022-03-16 NOTE — Patient Instructions (Signed)
Medication Instructions:  Your physician has recommended you make the following change in your medication:  INCREASE: Propranolol 20 mg twice daily *If you need a refill on your cardiac medications before your next appointment, please call your pharmacy*   Lab Work: None   Testing/Procedures: None   Follow-Up: At [facility], you and your health needs are our priority.  As part of our continuing mission to provide you with exceptional heart care, we have created designated Provider Care Teams.  These Care Teams include your primary Cardiologist (physician) and Advanced Practice Providers (APPs -  Physician Assistants and Nurse Practitioners) who all work together to provide you with the care you need, when you need it.  We recommend signing up for the patient portal called "MyChart".  Sign up information is provided on this After Visit Summary.  MyChart is used to connect with patients for Virtual Visits (Telemedicine).  Patients are able to view lab/test results, encounter notes, upcoming appointments, etc.  Non-urgent messages can be sent to your provider as well.   To learn more about what you can do with MyChart, go to [email or web address].    Your next appointment:   3 month(s)  Provider:   Berniece Salines, DO     Other Instructions Mediterranean Diet A Mediterranean diet refers to food and lifestyle choices that are based on the traditions of countries located on the [place]. It focuses on eating more fruits, vegetables, whole grains, beans, nuts, seeds, and heart-healthy fats, and eating less dairy, meat, eggs, and processed foods with added sugar, salt, and fat. This way of eating has been shown to help prevent certain conditions and improve outcomes for people who have chronic diseases, like kidney disease and heart disease. What are tips for following this plan? Reading food labels Check the serving size of packaged foods. For foods such as rice and pasta,  the serving size refers to the amount of cooked product, not dry. Check the total fat in packaged foods. Avoid foods that have saturated fat or trans fats. Check the ingredient list for added sugars, such as corn syrup. Shopping  Buy a variety of foods that offer a balanced diet, including: Fresh fruits and vegetables (produce). Grains, beans, nuts, and seeds. Some of these may be available in unpackaged forms or large amounts (in bulk). Fresh seafood. Poultry and eggs. Low-fat dairy products. Buy whole ingredients instead of prepackaged foods. Buy fresh fruits and vegetables in-season from local farmers markets. Buy plain frozen fruits and vegetables. If you do not have access to quality fresh seafood, buy precooked frozen shrimp or canned fish, such as tuna, salmon, or sardines. Stock your pantry so you always have certain foods on hand, such as olive oil, canned tuna, canned tomatoes, rice, pasta, and beans. Cooking Cook foods with extra-virgin olive oil instead of using butter or other vegetable oils. Have meat as a side dish, and have vegetables or grains as your main dish. This means having meat in small portions or adding small amounts of meat to foods like pasta or stew. Use beans or vegetables instead of meat in common dishes like chili or lasagna. Experiment with different cooking methods. Try roasting, broiling, steaming, and sauting vegetables. Add frozen vegetables to soups, stews, pasta, or rice. Add nuts or seeds for added healthy fats and plant protein at each meal. You can add these to yogurt, salads, or vegetable dishes. Marinate fish or vegetables using olive oil, lemon juice, garlic, and fresh herbs. Meal planning Plan  to eat one vegetarian meal one day each week. Try to work up to two vegetarian meals, if possible. Eat seafood two or more times a week. Have healthy snacks readily available, such as: Vegetable sticks with hummus. Greek yogurt. Fruit and nut trail  mix. Eat balanced meals throughout the week. This includes: Fruit: 2-3 servings a day. Vegetables: 4-5 servings a day. Low-fat dairy: 2 servings a day. Fish, poultry, or lean meat: 1 serving a day. Beans and legumes: 2 or more servings a week. Nuts and seeds: 1-2 servings a day. Whole grains: 6-8 servings a day. Extra-virgin olive oil: 3-4 servings a day. Limit red meat and sweets to only a few servings a month. Lifestyle  Cook and eat meals together with your family, when possible. Drink enough fluid to keep your urine pale yellow. Be physically active every day. This includes: Aerobic exercise like running or swimming. Leisure activities like gardening, walking, or housework. Get 7-8 hours of sleep each night. If recommended by your health care provider, drink red wine in moderation. This means 1 glass a day for nonpregnant women and 2 glasses a day for men. A glass of wine equals 5 oz (150 mL). What foods should I eat? Fruits Apples. Apricots. Avocado. Berries. Bananas. Cherries. Dates. Figs. Grapes. Lemons. Melon. Oranges. Peaches. Plums. Pomegranate. Vegetables Artichokes. Beets. Broccoli. Cabbage. Carrots. Eggplant. Green beans. Chard. Kale. Spinach. Onions. Leeks. Peas. Squash. Tomatoes. Peppers. Radishes. Grains Whole-grain pasta. Brown rice. Bulgur wheat. Polenta. Couscous. Whole-wheat bread. [place]. Meats and other proteins Beans. Almonds. Sunflower seeds. Pine nuts. Peanuts. [place]. Salmon. Scallops. Shrimp. [place]. Tilapia. Clams. Oysters. Eggs. Poultry without skin. Dairy Low-fat milk. Cheese. Greek yogurt. Fats and oils Extra-virgin olive oil. Avocado oil. Grapeseed oil. Beverages Water. Red wine. Herbal tea. Sweets and desserts Greek yogurt with honey. Baked apples. Poached pears. Trail mix. Seasonings and condiments Basil. Cilantro. Coriander. Cumin. Mint. Parsley. Sage. Rosemary. Tarragon. Garlic. Oregano. Thyme. Pepper. Balsamic vinegar. Tahini. Hummus.  Tomato sauce. Olives. Mushrooms. The items listed above may not be a complete list of foods and beverages you can eat. Contact a dietitian for more information. What foods should I limit? This is a list of foods that should be eaten rarely or only on special occasions. Fruits Fruit canned in syrup. Vegetables Deep-fried potatoes (french fries). Grains Prepackaged pasta or rice dishes. Prepackaged cereal with added sugar. Prepackaged snacks with added sugar. Meats and other proteins Beef. Pork. Lamb. Poultry with skin. Hot dogs. Berniece Salines. Dairy Ice cream. Sour cream. Whole milk. Fats and oils Butter. Canola oil. Vegetable oil. Beef fat (tallow). Lard. Beverages Juice. Sugar-sweetened soft drinks. Beer. Liquor and spirits. Sweets and desserts Cookies. Cakes. Pies. Candy. Seasonings and condiments Mayonnaise. Pre-made sauces and marinades. The items listed above may not be a complete list of foods and beverages you should limit. Contact a dietitian for more information. Summary The Mediterranean diet includes both food and lifestyle choices. Eat a variety of fresh fruits and vegetables, beans, nuts, seeds, and whole grains. Limit the amount of red meat and sweets that you eat. If recommended by your health care provider, drink red wine in moderation. This means 1 glass a day for nonpregnant women and 2 glasses a day for men. A glass of wine equals 5 oz (150 mL). This information is not intended to replace advice given to you by your health care provider. Make sure you discuss any questions you have with your health care provider. Document Revised: 01/23/2019 Document Reviewed: 11/20/2018 Elsevier Patient Education  [place].

## 2022-03-25 ENCOUNTER — Other Ambulatory Visit: Payer: Self-pay | Admitting: Physician Assistant

## 2022-03-25 DIAGNOSIS — B001 Herpesviral vesicular dermatitis: Secondary | ICD-10-CM

## 2022-03-25 DIAGNOSIS — I10 Essential (primary) hypertension: Secondary | ICD-10-CM

## 2022-04-04 ENCOUNTER — Other Ambulatory Visit: Payer: Self-pay | Admitting: Physician Assistant

## 2022-05-04 ENCOUNTER — Other Ambulatory Visit: Payer: Self-pay | Admitting: Physician Assistant

## 2022-05-09 ENCOUNTER — Encounter: Payer: Self-pay | Admitting: Physician Assistant

## 2022-05-09 ENCOUNTER — Ambulatory Visit: Payer: No Typology Code available for payment source | Admitting: Physician Assistant

## 2022-05-09 VITALS — BP 148/79 | HR 105 | Ht 66.0 in | Wt 186.0 lb

## 2022-05-09 DIAGNOSIS — M79605 Pain in left leg: Secondary | ICD-10-CM

## 2022-05-09 DIAGNOSIS — M79604 Pain in right leg: Secondary | ICD-10-CM | POA: Diagnosis not present

## 2022-05-09 DIAGNOSIS — R21 Rash and other nonspecific skin eruption: Secondary | ICD-10-CM

## 2022-05-09 DIAGNOSIS — I776 Arteritis, unspecified: Secondary | ICD-10-CM | POA: Diagnosis not present

## 2022-05-09 MED ORDER — GABAPENTIN 100 MG PO CAPS: 100.0000 mg | ORAL_CAPSULE | Freq: Three times a day (TID) | ORAL | 0 refills | Status: DC

## 2022-05-09 MED ORDER — PREDNISONE 20 MG PO TABS: ORAL_TABLET | ORAL | 0 refills | Status: DC

## 2022-05-09 NOTE — Progress Notes (Signed)
Established Patient Office Visit  Subjective   Patient ID: Alicia Sanders, female    DOB: [birth year]  Age: 42 y.o. MRN: [id number]  No chief complaint on file.   HPI Pt is a 42 yo female who presents to the clinic with bilateral leg pain and rash for 3 weeks. She is just not improving from her covid and her post covid dyspnea and cardiac instability. She noticed her legs were starting to ache more a few days after her hep B vaccine booster on April 11th. She then noticed brownish discoloration of both legs and generalized swelling. She has not started any new medication and not started statin. She is wearing compression stockings and help with swelling but her legs ache so bad at times she doesn't want to walk and effects her sleep. No fever, chills, body aches. She has tramadol and doesn't seem to really help it.  .. 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   Dysphagia 01/27/2016   Mood disorder (HCC) 01/27/2016   DDD (degenerative disc disease), cervical 01/27/2016   Overweight (BMI 25.0-29.9) 06/25/2016   Other viral warts 06/26/2016   Left lower quadrant abdominal pain 01/28/2017   Mesenteric adenitis 07/24/2017   Nausea 09/23/2017   Hirsutism 03/05/2018   Thyroid disease 01/07/2019   Appendicitis, acute 01/08/2019   Snoring 06/29/2019   Vitamin D deficiency 07/02/2019   Gastroenteritis 02/06/2021   Elevated blood pressure reading 05/23/2021   Intractable migraine without aura and with status migrainosus 05/23/2021   Stress at work 05/23/2021   Primary hypertension 10/04/2021   COVID-19 12/27/2021   Family history of heart disease 01/26/2022   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   Orthostatic hypotension 01/26/2022   Chest pain of uncertain  etiology 02/22/2022   Pre-procedure lab exam 02/22/2022   Rash 05/11/2022   Bilateral leg pain 05/11/2022   Vasculitis (HCC) 05/11/2022   Resolved Ambulatory Problems    Diagnosis Date Noted   Dermatophytosis of nail 09/04/2007   ANXIETY STATE NOS 10/22/2006   OTITIS MEDIA, SEROUS, ACUTE, RIGHT 12/03/2009   HOT FLASHES 10/16/2006   DERMATITIS 09/07/2009   SKIN RASH 09/04/2007   SYMPTOM, ENLARGEMENT, LYMPH NODES 10/16/2006   OTITIS EXTERNA 07/11/2010   LOW BACK PAIN, ACUTE 08/03/2010   BACK STRAIN, THORACIC 08/03/2010   Acute maxillary sinusitis 05/08/2012   Preventive measure 05/08/2012   Dysfunction of right eustachian tube 06/03/2012   Plantar fasciitis, right 04/18/2015   Weight gain 04/18/2015   Acute maxillary sinusitis 10/26/2015   Pleuritic chest pain 01/27/2016   Acute cystitis 10/11/2016   Cough 01/23/2017   Abnormal CT of the abdomen 09/23/2017   Change in bowel habits 09/23/2017   Bloating 03/05/2018   Bilateral lower abdominal discomfort 03/05/2018   No energy 03/05/2018   Acute appendicitis 01/08/2019   Past Medical History:  Diagnosis Date   Back problem    Eczema      ROS See HPI.    Objective:     BP (!) 148/79   Pulse (!) 105   Ht 5\' 6"  (1.676 m)   Wt 186 lb (84.4 kg)   SpO2 99%   BMI 30.02 kg/m  BP Readings from Last 3 Encounters:  05/09/22 (!) 148/79  03/16/22 126/75  03/02/22 113/87   Wt Readings from Last  3 Encounters:  05/09/22 186 lb (84.4 kg)  03/16/22 175 lb (79.4 kg)  02/22/22 175 lb 12.8 oz (79.7 kg)      Physical Exam Constitutional:      Appearance: Normal appearance.  HENT:     Head: Normocephalic.  Cardiovascular:     Rate and Rhythm: Normal rate and regular rhythm.     Pulses: Normal pulses.  Pulmonary:     Effort: Pulmonary effort is normal.     Breath sounds: Normal breath sounds.  Musculoskeletal:     Right lower leg: Edema present.     Left lower leg: Edema present.     Comments: Scant non-pitting  edema of feet, ankles and lower legs.   Skin:    Findings: Rash present.     Comments: Brownish macular rash over bilateral lower extremities.  2+ pedal pulses, bilaterally.  Tenderness to palpate lower legs.  No warmth.   Neurological:     General: No focal deficit present.     Mental Status: She is alert and oriented to person, place, and time.     Motor: No weakness.     Gait: Gait normal.  Psychiatric:        Mood and Affect: Mood normal.       The 10-year ASCVD risk score (Arnett DK, et al., 2019) is: 1.1%    Assessment & Plan:  Marland KitchenMarland KitchenDiagnoses and all orders for this visit:  Vasculitis (HCC) -     CBC w/Diff/Platelet -     COMPLETE METABOLIC PANEL WITH GFR -     Tissue transglutaminase, IgA -     ANA Screen,IFA,Reflex Titer/Pattern,Reflex Mplx 11 Ab Cascade with IdentRA -     Sed Rate (ESR) -     C-reactive protein -     predniSONE (DELTASONE) 20 MG tablet; Take 3 tablets for 3 days, take 2 tablet for 3 days, take 1 tablet for 3 days, take 1/2 tablet for 4 days. -     gabapentin (NEURONTIN) 100 MG capsule; Take 1 capsule (100 mg total) by mouth 3 (three) times daily.  Bilateral leg pain -     predniSONE (DELTASONE) 20 MG tablet; Take 3 tablets for 3 days, take 2 tablet for 3 days, take 1 tablet for 3 days, take 1/2 tablet for 4 days. -     gabapentin (NEURONTIN) 100 MG capsule; Take 1 capsule (100 mg total) by mouth 3 (three) times daily.  Rash -     predniSONE (DELTASONE) 20 MG tablet; Take 3 tablets for 3 days, take 2 tablet for 3 days, take 1 tablet for 3 days, take 1/2 tablet for 4 days. -     gabapentin (NEURONTIN) 100 MG capsule; Take 1 capsule (100 mg total) by mouth 3 (three) times daily.   Unclear etiology of symptoms but presents like vasculitis  ? If hep B vaccine induce symptoms ? Medication reaction but she has not started statin and all other medications she has been on and not likely to cause this type of reaction.  Will get labs to evaluate Pulses are  good No significant edema today Start prednisone and gabapentin for pain and symptoms Continue to wear compression stockings as tolerated Follow up as needed will call with lab results    Tandy Gaw, PA-C

## 2022-05-09 NOTE — Patient Instructions (Signed)
Vasculitis  Vasculitis is inflammation of the blood vessels. With vasculitis, the blood vessels can become thick, narrow, scarred, or weak. Enough blood may not be able to flow through them. This can cause damage to the muscles, kidneys, lungs, brain, and other parts of the body. There are many types of vasculitis. The different types may affect different kinds of blood vessels or different areas of the body. Some types last only a short time, while others last a long time. What are the causes? The exact cause of this condition is not known. However, vasculitis can develop when the body's defense system (immune system) attacks its own blood vessels. This attack can be caused by: An infection. An immune system disease, such as lupus, rheumatoid arthritis, or scleroderma. An allergic reaction to a medicine. A cancer that affects blood cells, such as leukemia or lymphoma. What increases the risk? The following factors may make you more likely to develop this condition: Being a smoker. Being under stress. Having a physical injury. What are the signs or symptoms? Symptoms of this condition depend on the type of vasculitis that you have. Symptoms that are common to all types of vasculitis include: Fever. Poor appetite. Weight loss. Feeling very tired (fatigue). Having aches and pains. Weakness. Numbness in an area of your body. Symptoms for specific types of vasculitis include: Skin problems, such as sores, spots, or rashes. Trouble seeing. Trouble breathing. Coughing up blood. Blood in your urine. Headaches. Stomach pain. Stuffy or bloody nose. How is this diagnosed? This condition may be diagnosed based on: Your symptoms. A physical exam. You may also have tests, including: Blood tests. A urine test. A biopsy of a blood vessel. A test to measure the electrical signals moving through nerves (nerve conduction study). Imaging tests, such as: X-rays. CT  scan. Ultrasound. MRI. Angiogram. How is this treated? Treatment for this condition will depend on the type of vasculitis that you have and how severe the symptoms are. Sometimes treatment is not needed. Treatment often includes: Medicines. Physical therapy or occupational therapy. This helps strengthen muscles that were weakened by the disease. You will need to see your health care provider while you are being treated. During follow-up visits, your health care provider may: Perform blood tests and bone density tests. Check your blood pressure and blood sugar. Check for side effects of any medicines you are taking. Vasculitis cannot always be cured. Sometimes symptoms go away but the disease does not (the disease goes into remission). If symptoms return, increased treatment may be needed. Follow these instructions at home: Take over-the-counter and prescription medicines only as told by your health care provider. Exercise as directed. Talk with your health care provider about what exercises are okay for you to do. Exercises that increase your heart rate (aerobic exercise), such as walking, are usually recommended. Aerobic exercise helps control your blood pressure and prevent bone loss. Follow a healthy diet. Make sure your diet includes fruits, vegetables, whole grains, and healthy sources of protein. Learn as much as you can about vasculitis, and consider joining a support group. Talk to other people who have your condition. This may help you cope with the illness. Talk with your health care provider if you feel stressed, anxious, or depressed. Keep all follow-up visits as told by your health care provider. This is important. Contact a health care provider if: Your symptoms return or you have new symptoms. Your fever, fatigue, headache, or weight loss gets worse. You have signs of infection, such as  redness, swelling, tenderness, warmth, or a new fever. Your pain does not go away, even  after you take pain medicine. Your nose bleeds. Get help right away if: Your vision gets worse. You have chest pain or stomach pain. You have trouble breathing. One side of your face or body suddenly becomes weak or numb. There is blood in your urine. Summary Vasculitis is inflammation of the blood vessels that may cause them to become thick, narrow, scarred, or weak. Enough blood may not be able to flow through them. This can cause damage throughout your body. The exact cause of this condition is not known. However, vasculitis can develop when the body's immune system attacks its own blood vessels. This attack may be caused by an infection, an immune system disease, an allergic reaction to a medicine, or a cancer that affects blood cells, such as leukemia or lymphoma. Vasculitis cannot always be cured. Sometimes symptoms go away but the disease does not (the disease goes into remission). If symptoms return, increased treatment may be needed. This information is not intended to replace advice given to you by your health care provider. Make sure you discuss any questions you have with your health care provider. Document Revised: 02/29/2020 Document Reviewed: 03/01/2020 Elsevier Patient Education  2023 [employer].

## 2022-05-11 ENCOUNTER — Other Ambulatory Visit: Payer: Self-pay | Admitting: Physician Assistant

## 2022-05-11 ENCOUNTER — Encounter: Payer: Self-pay | Admitting: Physician Assistant

## 2022-05-11 DIAGNOSIS — M79604 Pain in right leg: Secondary | ICD-10-CM | POA: Insufficient documentation

## 2022-05-11 DIAGNOSIS — R21 Rash and other nonspecific skin eruption: Secondary | ICD-10-CM | POA: Insufficient documentation

## 2022-05-11 DIAGNOSIS — I776 Arteritis, unspecified: Secondary | ICD-10-CM | POA: Insufficient documentation

## 2022-05-11 NOTE — Progress Notes (Signed)
WBC up a little. CRP elevated representing inflammation.  Kidney and liver look good.  IGA negative.  ESR normal.  Waiting on ANA.

## 2022-05-13 LAB — COMPLETE METABOLIC PANEL WITH GFR
AG Ratio: 1.4 (calc) (ref 1.0–2.5)
ALT: 19 U/L (ref 6–29)
AST: 14 U/L (ref 10–30)
Albumin: 4.2 g/dL (ref 3.6–5.1)
Alkaline phosphatase (APISO): 68 U/L (ref 31–125)
BUN: 14 mg/dL (ref 7–25)
CO2: 26 mmol/L (ref 20–32)
Calcium: 9 mg/dL (ref 8.6–10.2)
Chloride: 103 mmol/L (ref 98–110)
Creat: 0.93 mg/dL (ref 0.50–0.99)
Globulin: 3 g/dL (calc) (ref 1.9–3.7)
Glucose, Bld: 96 mg/dL (ref 65–99)
Potassium: 3.7 mmol/L (ref 3.5–5.3)
Sodium: 139 mmol/L (ref 135–146)
Total Bilirubin: 0.2 mg/dL (ref 0.2–1.2)
Total Protein: 7.2 g/dL (ref 6.1–8.1)
eGFR: 79 mL/min/{1.73_m2} (ref >=60)

## 2022-05-13 LAB — CBC WITH DIFFERENTIAL/PLATELET
Absolute Monocytes: 616 cells/uL (ref 200–950)
Basophils Absolute: 44 cells/uL (ref 0–200)
Basophils Relative: 0.4 %
Eosinophils Absolute: 154 cells/uL (ref 15–500)
Eosinophils Relative: 1.4 %
HCT: 41.9 % (ref 35.0–45.0)
Hemoglobin: 14.1 g/dL (ref 11.7–15.5)
Lymphs Abs: 3531 cells/uL (ref 850–3900)
MCH: 29.6 pg (ref 27.0–33.0)
MCHC: 33.7 g/dL (ref 32.0–36.0)
MCV: 87.8 fL (ref 80.0–100.0)
MPV: 10.6 fL (ref 7.5–12.5)
Monocytes Relative: 5.6 %
Neutro Abs: 6655 cells/uL (ref 1500–7800)
Neutrophils Relative %: 60.5 %
Platelets: 301 10*3/uL (ref 140–400)
RBC: 4.77 10*6/uL (ref 3.80–5.10)
RDW: 12.2 % (ref 11.0–15.0)
Total Lymphocyte: 32.1 %
WBC: 11 10*3/uL — ABNORMAL HIGH (ref 3.8–10.8)

## 2022-05-13 LAB — ANA SCREEN,IFA,REFLEX TITER/PATTERN,REFLEX MPLX 11 AB CASCADE
Anti Nuclear Antibody (ANA): NEGATIVE
Cyclic Citrullin Peptide Ab: <16 UNITS
MUTATED CITRULLINATED VIMENTIN (MCV) AB: <20 U/mL (ref <20)
Rheumatoid fact SerPl-aCnc: <10 IU/mL (ref <14)

## 2022-05-13 LAB — SEDIMENTATION RATE: Sed Rate: 6 mm/h (ref 0–20)

## 2022-05-13 LAB — TISSUE TRANSGLUTAMINASE, IGA: (tTG) Ab, IgA: <1 U/mL

## 2022-05-13 LAB — C-REACTIVE PROTEIN: CRP: 12.8 mg/L — ABNORMAL HIGH (ref <8.0)

## 2022-05-14 NOTE — Progress Notes (Signed)
No Auto-Antibodies detected which is good news.   How is rash and leg pain?

## 2022-05-18 ENCOUNTER — Ambulatory Visit: Payer: Managed Care, Other (non HMO) | Admitting: Cardiology

## 2022-05-21 ENCOUNTER — Other Ambulatory Visit: Payer: Self-pay | Admitting: Physician Assistant

## 2022-05-24 ENCOUNTER — Encounter (INDEPENDENT_AMBULATORY_CARE_PROVIDER_SITE_OTHER): Payer: No Typology Code available for payment source | Admitting: Sports Medicine

## 2022-05-24 DIAGNOSIS — R21 Rash and other nonspecific skin eruption: Secondary | ICD-10-CM | POA: Diagnosis not present

## 2022-05-25 NOTE — Telephone Encounter (Signed)
I spent 5 total minutes of online digital evaluation and management services in this patient-initiated request for online care. 

## 2022-05-29 NOTE — Telephone Encounter (Signed)
See note below.  She will need to make an appointment so that either it can be biopsied or we are happy to refer to dermatology if she would prefer.

## 2022-05-29 NOTE — Addendum Note (Signed)
Addended by: Chalmers Cater on: 05/29/2022 10:45 AM   Modules accepted: Orders

## 2022-05-29 NOTE — Addendum Note (Signed)
Addended by: Nani Gasser D on: 05/29/2022 04:26 PM   Modules accepted: Orders

## 2022-06-04 ENCOUNTER — Ambulatory Visit: Payer: PRIVATE HEALTH INSURANCE | Admitting: Cardiology

## 2022-06-05 ENCOUNTER — Other Ambulatory Visit: Payer: Self-pay | Admitting: Physician Assistant

## 2022-06-05 DIAGNOSIS — B001 Herpesviral vesicular dermatitis: Secondary | ICD-10-CM

## 2022-06-06 ENCOUNTER — Other Ambulatory Visit: Payer: Self-pay | Admitting: Physician Assistant

## 2022-06-06 DIAGNOSIS — B001 Herpesviral vesicular dermatitis: Secondary | ICD-10-CM

## 2022-06-06 MED ORDER — VALACYCLOVIR HCL 1 G PO TABS: ORAL_TABLET | ORAL | 0 refills | Status: DC

## 2022-06-24 ENCOUNTER — Other Ambulatory Visit: Payer: Self-pay | Admitting: Physician Assistant

## 2022-06-24 DIAGNOSIS — I10 Essential (primary) hypertension: Secondary | ICD-10-CM

## 2022-06-25 ENCOUNTER — Other Ambulatory Visit: Payer: Self-pay | Admitting: Physician Assistant

## 2022-06-25 ENCOUNTER — Encounter: Payer: Self-pay | Admitting: Physician Assistant

## 2022-06-25 DIAGNOSIS — I10 Essential (primary) hypertension: Secondary | ICD-10-CM

## 2022-06-27 ENCOUNTER — Telehealth: Payer: No Typology Code available for payment source | Admitting: Physician Assistant

## 2022-06-27 ENCOUNTER — Encounter: Payer: Self-pay | Admitting: Physician Assistant

## 2022-06-27 VITALS — BP 120/80 | HR 92 | Ht 66.0 in | Wt 188.0 lb

## 2022-06-27 DIAGNOSIS — M7062 Trochanteric bursitis, left hip: Secondary | ICD-10-CM

## 2022-06-27 DIAGNOSIS — M5136 Other intervertebral disc degeneration, lumbar region: Secondary | ICD-10-CM

## 2022-06-27 DIAGNOSIS — R600 Localized edema: Secondary | ICD-10-CM | POA: Insufficient documentation

## 2022-06-27 DIAGNOSIS — E66811 Obesity, class 1: Secondary | ICD-10-CM | POA: Insufficient documentation

## 2022-06-27 DIAGNOSIS — I10 Essential (primary) hypertension: Secondary | ICD-10-CM

## 2022-06-27 DIAGNOSIS — E6609 Other obesity due to excess calories: Secondary | ICD-10-CM | POA: Diagnosis not present

## 2022-06-27 DIAGNOSIS — Z6830 Body mass index (BMI) 30.0-30.9, adult: Secondary | ICD-10-CM

## 2022-06-27 MED ORDER — PREDNISONE 20 MG PO TABS: ORAL_TABLET | ORAL | 0 refills | Status: DC

## 2022-06-27 MED ORDER — MOUNJARO 2.5 MG/0.5ML ~~LOC~~ SOAJ: 2.5000 mg | SUBCUTANEOUS | 0 refills | Status: DC

## 2022-06-27 MED ORDER — OXYCODONE-ACETAMINOPHEN 5-325 MG PO TABS: 1.0000 | ORAL_TABLET | Freq: Three times a day (TID) | ORAL | 0 refills | Status: DC | PRN

## 2022-06-27 MED ORDER — WEGOVY 0.25 MG/0.5ML ~~LOC~~ SOAJ: 0.2500 mg | SUBCUTANEOUS | 0 refills | Status: DC

## 2022-06-27 NOTE — Progress Notes (Signed)
..Virtual Visit via Video Note  I connected with Alicia Sanders on 06/27/22 at  7:10 AM EDT by a video enabled telemedicine application and verified that I am speaking with the correct person using two identifiers.  Location: Patient: car Provider: clinic  .Marland KitchenParticipating in visit:  Patient: Alicia Sanders Provider:Rilley Stash PA-C   I discussed the limitations of evaluation and management by telemedicine and the availability of in person appointments. The patient expressed understanding and agreed to proceed.  History of Present Illness: Pt is a 42 yo female who calls into the clinic with severe left hip pain after canoeing on Saturday for 2 hrs. Hx of hip bursitis. Pain is 7/10. Hard to walk. Tramadol and ibuprofen helping very little.   She has gained 30lbs in 6 months. She is wanting something to help with weight loss.   .. 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   Dysphagia 01/27/2016   Mood disorder (HCC) 01/27/2016   DDD (degenerative disc disease), cervical 01/27/2016   Overweight (BMI 25.0-29.9) 06/25/2016   Other viral warts 06/26/2016   Left lower quadrant abdominal pain 01/28/2017   Mesenteric adenitis 07/24/2017   Nausea 09/23/2017   Hirsutism 03/05/2018   Thyroid disease 01/07/2019   Appendicitis, acute 01/08/2019   Snoring 06/29/2019   Vitamin D deficiency 07/02/2019   Gastroenteritis 02/06/2021   Elevated blood pressure reading 05/23/2021   Intractable migraine without aura and with status migrainosus 05/23/2021   Stress at work 05/23/2021   Primary hypertension 10/04/2021   COVID-19 12/27/2021   Family history of heart disease 01/26/2022   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   Orthostatic hypotension 01/26/2022   Chest pain of  uncertain etiology 02/22/2022   Pre-procedure lab exam 02/22/2022   Rash 05/11/2022   Bilateral leg pain 05/11/2022   Vasculitis (HCC) 05/11/2022   Class 1 obesity due to excess calories without serious comorbidity with body mass index (BMI) of 30.0 to 30.9 in adult 06/27/2022   Leg edema 06/27/2022   Resolved Ambulatory Problems    Diagnosis Date Noted   Dermatophytosis of nail 09/04/2007   ANXIETY STATE NOS 10/22/2006   OTITIS MEDIA, SEROUS, ACUTE, RIGHT 12/03/2009   HOT FLASHES 10/16/2006   DERMATITIS 09/07/2009   SKIN RASH 09/04/2007   SYMPTOM, ENLARGEMENT, LYMPH NODES 10/16/2006   OTITIS EXTERNA 07/11/2010   LOW BACK PAIN, ACUTE 08/03/2010   BACK STRAIN, THORACIC 08/03/2010   Acute maxillary sinusitis 05/08/2012   Preventive measure 05/08/2012   Dysfunction of right eustachian tube 06/03/2012   Plantar fasciitis, right 04/18/2015   Weight gain 04/18/2015   Acute maxillary sinusitis 10/26/2015   Pleuritic chest pain 01/27/2016   Acute cystitis 10/11/2016   Cough 01/23/2017   Abnormal CT of the abdomen 09/23/2017   Change in bowel habits 09/23/2017   Bloating 03/05/2018   Bilateral lower abdominal discomfort 03/05/2018   No energy 03/05/2018   Acute appendicitis 01/08/2019   Past Medical History:  Diagnosis Date   Back problem    Eczema       Observations/Objective: No acute distress Normal mood and appearance Normal breathing  . Today's Vitals   06/27/22 0700  BP: 120/80  Pulse: 92  SpO2: 99%  Weight: 188 lb (85.3 kg)  Height: 5\' 6"  (1.676 m)   Body mass index is 30.34  kg/m.     Assessment and Plan: Marland KitchenMarland KitchenSurena was seen today for pain and weight loss.  Diagnoses and all orders for this visit:  Trochanteric bursitis, left hip -     predniSONE (DELTASONE) 20 MG tablet; Take 3 tablets for 3 days, take 2 tablet for 3 days, take 1 tablet for 3 days, take 1/2 tablet for 4 days. -     oxyCODONE-acetaminophen (PERCOCET/ROXICET) 5-325 MG tablet; Take 1  tablet by mouth every 8 (eight) hours as needed. For break through pain. -     WEGOVY 0.25 MG/0.5ML SOAJ; Inject 0.25 mg into the skin once a week. Use this dose for 1 month (4 shots) and then increase to next higher dose.  Class 1 obesity due to excess calories without serious comorbidity with body mass index (BMI) of 30.0 to 30.9 in adult -     WEGOVY 0.25 MG/0.5ML SOAJ; Inject 0.25 mg into the skin once a week. Use this dose for 1 month (4 shots) and then increase to next higher dose.  DDD (degenerative disc disease), lumbar -     WEGOVY 0.25 MG/0.5ML SOAJ; Inject 0.25 mg into the skin once a week. Use this dose for 1 month (4 shots) and then increase to next higher dose.  Primary hypertension  Leg edema   Gained 30lbs over past 6 months and worsening her lumbar DDD, bursitis .Marland KitchenDiscussed low carb diet with 1500 calories and 80g of protein.  Exercising at least 150 minutes a week.  [employer] could be a Chief Technology Officer.  Discussed options Not candidate for phentermine due to cardiac instability On wellbutrin Start wegovy, discussed SE and how to titrate up Follow up in 3 months  BP looks great  Bursitis flare Discussed symptomatic care .Marland KitchenPDMP reviewed during this encounter. Small quantity of oxycodone for break through pain and not to use with tramadol Prednisone burst Follow up as needed Rest as much as you can    Follow Up Instructions:    I discussed the assessment and treatment plan with the patient. The patient was provided an opportunity to ask questions and all were answered. The patient agreed with the plan and demonstrated an understanding of the instructions.   The patient was advised to call back or seek an in-person evaluation if the symptoms worsen or if the condition fails to improve as anticipated.  Tandy Gaw, PA-C

## 2022-06-27 NOTE — Addendum Note (Signed)
Addended byJomarie Longs on: 06/27/2022 12:50 PM   Modules accepted: Orders

## 2022-07-04 ENCOUNTER — Telehealth: Payer: Self-pay

## 2022-07-04 NOTE — Telephone Encounter (Signed)
Initiated Prior authorization WUJ:[id number] 2.5MG /0.5ML pen-injectors Via: Covermymeds Case/Key:[id number] Status: Pending  denial as of 07/04/22 Reason:medical criteria not met Notified Pt via: Mychart

## 2022-07-10 MED ORDER — ZEPBOUND 2.5 MG/0.5ML ~~LOC~~ SOAJ: 2.5000 mg | SUBCUTANEOUS | 0 refills | Status: DC

## 2022-07-10 NOTE — Addendum Note (Signed)
Addended by: [name] on: 07/10/2022 03:58 PM   Modules accepted: Orders

## 2022-07-24 ENCOUNTER — Other Ambulatory Visit: Payer: Self-pay | Admitting: Physician Assistant

## 2022-07-24 ENCOUNTER — Encounter: Payer: Self-pay | Admitting: Physician Assistant

## 2022-07-24 DIAGNOSIS — B001 Herpesviral vesicular dermatitis: Secondary | ICD-10-CM

## 2022-07-24 MED ORDER — VALACYCLOVIR HCL 1 G PO TABS: ORAL_TABLET | ORAL | 0 refills | Status: DC

## 2022-09-12 ENCOUNTER — Encounter: Payer: Self-pay | Admitting: Physician Assistant

## 2022-09-12 DIAGNOSIS — G43011 Migraine without aura, intractable, with status migrainosus: Secondary | ICD-10-CM

## 2022-09-13 ENCOUNTER — Encounter: Payer: Self-pay | Admitting: Physician Assistant

## 2022-09-13 DIAGNOSIS — I10 Essential (primary) hypertension: Secondary | ICD-10-CM

## 2022-09-13 DIAGNOSIS — E6609 Other obesity due to excess calories: Secondary | ICD-10-CM

## 2022-09-13 DIAGNOSIS — M7062 Trochanteric bursitis, left hip: Secondary | ICD-10-CM

## 2022-09-13 DIAGNOSIS — M5136 Other intervertebral disc degeneration, lumbar region with discogenic back pain only: Secondary | ICD-10-CM

## 2022-09-13 NOTE — Telephone Encounter (Signed)
Requesting rx rf of sumatriptan 100mg  Last written 05/22/2021 Last OV 06/27/2022 No upcoming appt schld.

## 2022-09-14 MED ORDER — TOPIRAMATE 50 MG PO TABS: ORAL_TABLET | ORAL | 0 refills | Status: DC

## 2022-09-14 MED ORDER — SUMATRIPTAN SUCCINATE 100 MG PO TABS: 100.0000 mg | ORAL_TABLET | ORAL | 1 refills | Status: AC | PRN

## 2022-09-14 NOTE — Addendum Note (Signed)
Addended by: [name] on: 09/14/2022 10:41 AM   Modules accepted: Orders

## 2022-09-16 ENCOUNTER — Other Ambulatory Visit: Payer: Self-pay | Admitting: Physician Assistant

## 2022-09-16 DIAGNOSIS — I10 Essential (primary) hypertension: Secondary | ICD-10-CM

## 2022-09-17 ENCOUNTER — Ambulatory Visit: Payer: PRIVATE HEALTH INSURANCE | Attending: Cardiology | Admitting: Cardiology

## 2022-09-17 ENCOUNTER — Encounter: Payer: Self-pay | Admitting: Cardiology

## 2022-09-17 VITALS — BP 118/82 | HR 78 | Ht 66.0 in | Wt 196.2 lb

## 2022-09-17 DIAGNOSIS — I10 Essential (primary) hypertension: Secondary | ICD-10-CM

## 2022-09-17 DIAGNOSIS — R0602 Shortness of breath: Secondary | ICD-10-CM | POA: Diagnosis not present

## 2022-09-17 DIAGNOSIS — R5383 Other fatigue: Secondary | ICD-10-CM | POA: Diagnosis not present

## 2022-09-17 DIAGNOSIS — I2510 Atherosclerotic heart disease of native coronary artery without angina pectoris: Secondary | ICD-10-CM

## 2022-09-17 DIAGNOSIS — Z6830 Body mass index (BMI) 30.0-30.9, adult: Secondary | ICD-10-CM

## 2022-09-17 DIAGNOSIS — R0683 Snoring: Secondary | ICD-10-CM | POA: Diagnosis not present

## 2022-09-17 DIAGNOSIS — E782 Mixed hyperlipidemia: Secondary | ICD-10-CM

## 2022-09-17 DIAGNOSIS — E6609 Other obesity due to excess calories: Secondary | ICD-10-CM

## 2022-09-17 NOTE — Progress Notes (Addendum)
Cardiology Office Note:    Date:  09/17/2022   ID:  Alicia Sanders, DOB December 15, 1980, MRN [id number]  PCP:  [name], PA-C  Cardiologist:  [name], DO  Electrophysiologist:  None   Referring MD: [name], PA-C   Chief Complaint  Patient presents with   Shortness of Breath    Pt says she still feel sob when doing activities and sweats a lot.    Fatigue    Pt says she still doesn't feel like herself.     History of Present Illness:    Alicia Sanders is a 42 y.o. female with a hx of mild coronary artery disease, hyperlipidemia, history of COVID 19 infection, obesity here today for follow-up visit.  She tells me that she still is experiencing shortness of breath.  She is concerned about this.  She says that she was trying to get more active this summer and came into the pool but has gotten progressive shortness of breath over time.  She does admit to some fatigue/daytime somnolence as well as snoring.  Past Medical History:  Diagnosis Date   Back problem    Bilateral lower abdominal discomfort 03/05/2018   Bloating 03/05/2018   Change in bowel habits 09/23/2017   Cough 01/23/2017   Eczema    Hypothyroidism    No energy 03/05/2018   Pleuritic chest pain 01/27/2016   Preventive measure 05/08/2012   Weight gain 04/18/2015    Past Surgical History:  Procedure Laterality Date   HAND SURGERY Right    INTRAUTERINE DEVICE INSERTION     LAPAROSCOPIC APPENDECTOMY N/A 01/08/2019   Procedure: APPENDECTOMY LAPAROSCOPIC;  Surgeon: Harriette Bouillon, MD;  Location: [facility];  Service: General;  Laterality: N/A;    Current Medications: Current Meds  Medication Sig   albuterol (PROVENTIL HFA;VENTOLIN HFA) 108 (90 Base) MCG/ACT inhaler 2 puffs 15 minutes before physical activity and exercise   buPROPion (WELLBUTRIN XL) 300 MG 24 hr tablet Take 1 tablet (300 mg total) by mouth daily.   Clindamycin-Benzoyl Per, Refr, gel APPLY TO AFFECTED AREA TWICE A DAY   dicyclomine (BENTYL) 10  MG capsule TAKE 1 CAPSULE (10 MG) BY MOUTH 3 (THREE) TIMES DAILY AS NEEDED FOR SPASMS.   hydrochlorothiazide (HYDRODIURIL) 25 MG tablet TAKE 1 TABLET (25 MG TOTAL) BY MOUTH DAILY.   levothyroxine (SYNTHROID) 50 MCG tablet TAKE 1 TABLET BY MOUTH EVERY DAY   losartan (COZAAR) 25 MG tablet TAKE 1 TABLET (25 MG TOTAL) BY MOUTH DAILY.   montelukast (SINGULAIR) 10 MG tablet TAKE 1 TABLET BY MOUTH EVERYDAY AT BEDTIME   mupirocin ointment (BACTROBAN) 2 % Apply topically daily.   ondansetron (ZOFRAN-ODT) 8 MG disintegrating tablet Take 1 tablet (8 mg total) by mouth every 8 (eight) hours as needed for nausea.   propranolol (INDERAL) 20 MG tablet Take 1 tablet (20 mg total) by mouth 2 (two) times daily.   Rimegepant Sulfate (NURTEC) 75 MG TBDP Take 1 tablet by mouth as needed. No more than one tablet in 24 hour period.   rosuvastatin (CRESTOR) 5 MG tablet Take 1 tablet (5 mg total) by mouth daily.   SUMAtriptan (IMITREX) 100 MG tablet Take 1 tablet (100 mg total) by mouth every 2 (two) hours as needed for migraine. May repeat in 2 hours if headache persists or recurs.   topiramate (TOPAMAX) 50 MG tablet One half tab by mouth daily for a week, then one tab by mouth daily.   traMADol (ULTRAM) 50 MG tablet TAKE 1  TABLET BY MOUTH THREE TIMES A DAY AS NEEDED   tretinoin (RETIN-A) 0.05 % cream Apply 1 application  topically daily as needed (ezma).   valACYclovir (VALTREX) 1000 MG tablet TAKE 2 TABLETS BY MOUTH 2 (TWO) TIMES DAILY FOR 3 DAYS.     Allergies:   Thimerosal (thiomersal), Adhesive [tape], and Hydrocodone   Social History   Socioeconomic History   Marital status: Married    Spouse name: Not on file   Number of children: 2   Years of education: Not on file   Highest education level: Not on file  Occupational History   Occupation: CT tech  Tobacco Use   Smoking status: Never   Smokeless tobacco: Never  Substance and Sexual Activity   Alcohol use: Yes    Comment: occasional   Drug use:  Never   Sexual activity: Yes    Birth control/protection: None  Other Topics Concern   Not on file  Social History Narrative   Not on file   Social Determinants of Health   Financial Resource Strain: Not on file  Food Insecurity: Not on file  Transportation Needs: Not on file  Physical Activity: Not on file  Stress: Not on file  Social Connections: Unknown (05/02/2021)   Received from [facility]   Social Network    Social Network: Not on file     Family History: The patient's family history includes Diabetes in her maternal grandmother; Endometrial cancer in her mother; Heart disease in her brother, father, and paternal grandfather; Lung cancer in her maternal grandfather. There is no history of Colon cancer, Esophageal cancer, Inflammatory bowel disease, Liver disease, Pancreatic cancer, Rectal cancer, Stomach cancer, or Breast cancer.  ROS:   Review of Systems  Constitution: Negative for decreased appetite, fever and weight gain.  HENT: Negative for congestion, ear discharge, hoarse voice and sore throat.   Eyes: Negative for discharge, redness, vision loss in right eye and visual halos.  Cardiovascular: Negative for chest pain, dyspnea on exertion, leg swelling, orthopnea and palpitations.  Respiratory: Negative for cough, hemoptysis, shortness of breath and snoring.   Endocrine: Negative for heat intolerance and polyphagia.  Hematologic/Lymphatic: Negative for bleeding problem. Does not bruise/bleed easily.  Skin: Negative for flushing, nail changes, rash and suspicious lesions.  Musculoskeletal: Negative for arthritis, joint pain, muscle cramps, myalgias, neck pain and stiffness.  Gastrointestinal: Negative for abdominal pain, bowel incontinence, diarrhea and excessive appetite.  Genitourinary: Negative for decreased libido, genital sores and incomplete emptying.  Neurological: Negative for brief paralysis, focal weakness, headaches and loss of balance.   Psychiatric/Behavioral: Negative for altered mental status, depression and suicidal ideas.  Allergic/Immunologic: Negative for HIV exposure and persistent infections.    EKGs/Labs/Other Studies Reviewed:    The following studies were reviewed today:   EKG:  The ekg ordered today demonstrates   Recent Labs: 01/12/2022: TSH 1.12 02/22/2022: Magnesium 1.9 05/09/2022: ALT 19; BUN 14; Creat 0.93; Hemoglobin 14.1; Platelets 301; Potassium 3.7; Sodium 139  Recent Lipid Panel    Component Value Date/Time   CHOL 163 10/04/2021 0000   TRIG 61 10/04/2021 0000   HDL 44 (L) 10/04/2021 0000   CHOLHDL 3.7 10/04/2021 0000   VLDL 9 09/27/2015 0811   LDLCALC 104 (H) 10/04/2021 0000    Physical Exam:    VS:  BP 118/82 (BP Location: Left Arm, Patient Position: Sitting, Cuff Size: Normal)   Pulse 78   Ht 5\' 6"  (1.676 m)   Wt 196 lb 3.2 oz (89  kg)   SpO2 95%   BMI 31.67 kg/m     Wt Readings from Last 3 Encounters:  09/17/22 196 lb 3.2 oz (89 kg)  06/27/22 188 lb (85.3 kg)  05/09/22 186 lb (84.4 kg)     GEN: Well nourished, well developed in no acute distress HEENT: Normal NECK: No JVD; No carotid bruits LYMPHATICS: No lymphadenopathy CARDIAC: S1S2 noted,RRR, no murmurs, rubs, gallops RESPIRATORY:  Clear to auscultation without rales, wheezing or rhonchi  ABDOMEN: Soft, non-tender, non-distended, +bowel sounds, no guarding. EXTREMITIES: No edema, No cyanosis, no clubbing MUSCULOSKELETAL:  No deformity  SKIN: Warm and dry NEUROLOGIC:  Alert and oriented x 3, non-focal PSYCHIATRIC:  Normal affect, good insight  ASSESSMENT:    1. Snoring   2. Fatigue, unspecified type   3. SOB (shortness of breath)   4. Primary hypertension   5. Class 1 obesity due to excess calories without serious comorbidity with body mass index (BMI) of 30.0 to 30.9 in adult   6. Mixed hyperlipidemia   7. Mild CAD    PLAN:     I discussed with the patient have my suspicion that her shortness of breath may  be cardiac in nature.  Will therefore set her up for pulmonary function test.  I also would like her to see pulmonary in evaluation. She is getting some relief with her palpitations with the propranolol.  For snoring daytime somnolence and fatigue and suspecting her shortness of breath could be undiagnosed sleep apnea STOP-BANG 4 , intermediate risk for sleep study.  Will get sleep study, she has been given the [facility] sleeps at  The patient understands the need to lose weight with diet and exercise. We have discussed specific strategies for this. In the setting of her mild coronary artery disease she has no anginal symptoms.  Continue patient on her Crestor. Blood pressure is acceptable, continue with current antihypertensive regimen.  The patient is in agreement with the above plan. The patient left the office in stable condition.  The patient will follow up in   Medication Adjustments/Labs and Tests Ordered: Current medicines are reviewed at length with the patient today.  Concerns regarding medicines are outlined above.  Orders Placed This Encounter  Procedures   Ambulatory referral to Pulmonology   Pulmonary function test   Itamar Sleep Study   No orders of the defined types were placed in this encounter.   Patient Instructions  Medication Instructions:  Your physician recommends that you continue on your current medications as directed. Please refer to the Current Medication list given to you today.  *If you need a refill on your cardiac medications before your next appointment, please call your pharmacy*   Lab Work: None   Testing/Procedures: Your physician has recommended that you have a pulmonary function test. Pulmonary Function Tests are a group of tests that measure how well air moves in and out of your lungs.  Your physician has recommended that you have a sleep study. This test records several body functions during sleep, including: brain activity, eye movement,  oxygen and carbon dioxide blood levels, heart rate and rhythm, breathing rate and rhythm, the flow of air through your mouth and nose, snoring, body muscle movements, and chest and belly movement.   Follow-Up: At [facility], you and your health needs are our priority.  As part of our continuing mission to provide you with exceptional heart care, we have created designated Provider Care Teams.  These Care Teams include your primary Cardiologist (  physician) and Advanced Practice Providers (APPs -  Physician Assistants and Nurse Practitioners) who all work together to provide you with the care you need, when you need it.   Your next appointment:   1 year(s)  Provider:   [name], DO    Adopting a Healthy Lifestyle.  Know what a healthy weight is for you (roughly BMI <25) and aim to maintain this   Aim for 7+ servings of fruits and vegetables daily   65-80+ fluid ounces of water or unsweet tea for healthy kidneys   Limit to max 1 drink of alcohol per day; avoid smoking/tobacco   Limit animal fats in diet for cholesterol and heart health - choose grass fed whenever available   Avoid highly processed foods, and foods high in saturated/trans fats   Aim for low stress - take time to unwind and care for your mental health   Aim for 150 min of moderate intensity exercise weekly for heart health, and weights twice weekly for bone health   Aim for 7-9 hours of sleep daily   When it comes to diets, agreement about the perfect plan isnt easy to find, even among the experts. Experts at the [facility] [employer] developed an idea known as the Healthy Eating Plate. Just imagine a plate divided into logical, healthy portions.   The emphasis is on diet quality:   Load up on vegetables and fruits - one-half of your plate: Aim for color and variety, and remember that potatoes dont count.   Go for whole grains - one-quarter of your plate: Whole wheat, barley, wheat  berries, quinoa, oats, brown rice, and foods made with them. If you want pasta, go with whole wheat pasta.   Protein power - one-quarter of your plate: Fish, chicken, beans, and nuts are all healthy, versatile protein sources. Limit red meat.   The diet, however, does go beyond the plate, offering a few other suggestions.   Use healthy plant oils, such as olive, canola, soy, corn, sunflower and peanut. Check the labels, and avoid partially hydrogenated oil, which have unhealthy trans fats.   If youre thirsty, drink water. Coffee and tea are good in moderation, but skip sugary drinks and limit milk and dairy products to one or two daily servings.   The type of carbohydrate in the diet is more important than the amount. Some sources of carbohydrates, such as vegetables, fruits, whole grains, and beans-are healthier than others.   Finally, stay active  Signed, [name], DO  09/17/2022 8:40 AM    [facility]

## 2022-09-17 NOTE — Progress Notes (Signed)
Patient agreement reviewed and signed on 09/17/2022.  WatchPAT issued to patient on 09/17/2022 by Brunetta Genera, CMA. Patient aware to not open the WatchPAT box until contacted with the activation PIN. Patient profile initialized in CloudPAT on 09/17/2022 by Brunetta Genera, CMA. Device serial number: [id number]

## 2022-09-17 NOTE — Patient Instructions (Signed)
Medication Instructions:  Your physician recommends that you continue on your current medications as directed. Please refer to the Current Medication list given to you today.  *If you need a refill on your cardiac medications before your next appointment, please call your pharmacy*   Lab Work: None   Testing/Procedures: Your physician has recommended that you have a pulmonary function test. Pulmonary Function Tests are a group of tests that measure how well air moves in and out of your lungs.  Your physician has recommended that you have a sleep study. This test records several body functions during sleep, including: brain activity, eye movement, oxygen and carbon dioxide blood levels, heart rate and rhythm, breathing rate and rhythm, the flow of air through your mouth and nose, snoring, body muscle movements, and chest and belly movement.   Follow-Up: At [facility], you and your health needs are our priority.  As part of our continuing mission to provide you with exceptional heart care, we have created designated Provider Care Teams.  These Care Teams include your primary Cardiologist (physician) and Advanced Practice Providers (APPs -  Physician Assistants and Nurse Practitioners) who all work together to provide you with the care you need, when you need it.   Your next appointment:   1 year(s)  Provider:   [name], DO

## 2022-09-19 ENCOUNTER — Other Ambulatory Visit: Payer: Self-pay | Admitting: Physician Assistant

## 2022-09-19 MED ORDER — LOSARTAN POTASSIUM 25 MG PO TABS: 25.0000 mg | ORAL_TABLET | Freq: Every day | ORAL | 1 refills | Status: DC

## 2022-09-19 MED ORDER — ZEPBOUND 5 MG/0.5ML ~~LOC~~ SOAJ: 5.0000 mg | SUBCUTANEOUS | 0 refills | Status: DC

## 2022-10-05 MED ORDER — TIRZEPATIDE-WEIGHT MANAGEMENT 2.5 MG/0.5ML ~~LOC~~ SOLN: 2.5000 mg | SUBCUTANEOUS | 5 refills | Status: DC

## 2022-10-05 NOTE — Addendum Note (Signed)
Addended by: [name] on: 10/05/2022 02:09 PM   Modules accepted: Orders

## 2022-10-05 NOTE — Telephone Encounter (Signed)
Attemtped call to [facility] - held for 6 minutes without answer - will call back on Monday 10/08/22.

## 2022-10-05 NOTE — Telephone Encounter (Signed)
???   Not sure what would be missing it was an rx..can we look into this. Sent to [facility]

## 2022-10-09 MED ORDER — TIRZEPATIDE-WEIGHT MANAGEMENT 2.5 MG/0.5ML ~~LOC~~ SOLN: 2.5000 mg | SUBCUTANEOUS | 5 refills | Status: DC

## 2022-10-09 NOTE — Addendum Note (Signed)
Addended by: [name] on: 10/09/2022 03:50 PM   Modules accepted: Orders

## 2022-10-16 ENCOUNTER — Other Ambulatory Visit: Payer: Self-pay | Admitting: Physician Assistant

## 2022-10-16 ENCOUNTER — Other Ambulatory Visit (HOSPITAL_COMMUNITY): Payer: Self-pay

## 2022-10-16 DIAGNOSIS — B001 Herpesviral vesicular dermatitis: Secondary | ICD-10-CM

## 2022-10-16 MED ORDER — OXYCODONE-ACETAMINOPHEN 5-325 MG PO TABS
1.0000 | ORAL_TABLET | Freq: Three times a day (TID) | ORAL | 0 refills | Status: DC | PRN
  Filled 2022-10-16: qty 15, 5d supply, fill #0

## 2022-10-16 MED ORDER — TIRZEPATIDE-WEIGHT MANAGEMENT 2.5 MG/0.5ML ~~LOC~~ SOLN: 2.5000 mg | SUBCUTANEOUS | 5 refills | Status: DC

## 2022-10-16 MED ORDER — VALACYCLOVIR HCL 1 G PO TABS: ORAL_TABLET | ORAL | 0 refills | Status: DC

## 2022-10-16 MED ORDER — OXYCODONE-ACETAMINOPHEN 5-325 MG PO TABS: 1.0000 | ORAL_TABLET | Freq: Three times a day (TID) | ORAL | 0 refills | Status: DC | PRN

## 2022-10-16 MED ORDER — PREDNISONE 20 MG PO TABS: ORAL_TABLET | ORAL | 0 refills | Status: DC

## 2022-10-16 NOTE — Addendum Note (Signed)
Addended by: [name] on: 10/16/2022 01:08 PM   Modules accepted: Orders

## 2022-10-16 NOTE — Addendum Note (Signed)
Addended by: [name] on: 10/16/2022 02:52 PM   Modules accepted: Orders

## 2022-10-16 NOTE — Addendum Note (Signed)
Addended by: [name] on: 10/16/2022 01:14 PM   Modules accepted: Orders

## 2022-10-16 NOTE — Addendum Note (Signed)
Addended by: Chalmers Cater on: 10/16/2022 02:10 PM   Modules accepted: Orders

## 2022-10-16 NOTE — Telephone Encounter (Signed)
PDMP reviewed during this encounter.

## 2022-11-15 ENCOUNTER — Other Ambulatory Visit: Payer: Self-pay | Admitting: Physician Assistant

## 2022-12-03 ENCOUNTER — Telehealth: Payer: Self-pay

## 2022-12-03 NOTE — Telephone Encounter (Signed)
**Note De-Identified Alicia Sanders Obfuscation** Ordering provider: Dr Servando Salina Associated diagnoses: Snoring-R06.83 and Fatigue-R53.83 WatchPAT PA obtained on 12/03/2022 by Iyahna Obriant, Lorelle Formosa, LPN. Authorization: Per Dellie Catholic at [facility],  No PA required for CPT Code: 16109 Call Reference #: [id number] Patient notified of PIN ([phone]) on 12/03/2022 Damiean Lukes Notification Method: phone. No answer so I left a message on her VM (Ok per [facility]) advising her of the WatchPat One-HST device Pin # of "[id number]". I have also sent her a [facility] message with her Pin # as well.  Phone note routed to covering staff for follow-up.

## 2022-12-11 ENCOUNTER — Other Ambulatory Visit: Payer: Self-pay | Admitting: Physician Assistant

## 2022-12-11 DIAGNOSIS — I10 Essential (primary) hypertension: Secondary | ICD-10-CM

## 2022-12-15 ENCOUNTER — Encounter: Payer: Self-pay | Admitting: Physician Assistant

## 2022-12-17 MED ORDER — ZEPBOUND 5 MG/0.5ML ~~LOC~~ SOAJ: 5.0000 mg | SUBCUTANEOUS | 0 refills | Status: DC

## 2022-12-24 NOTE — Telephone Encounter (Signed)
Called patient to inquire about Itamar sleep study completion. No answer at this time. Left message for her to return the call.

## 2023-01-23 ENCOUNTER — Encounter: Payer: Self-pay | Admitting: Physician Assistant

## 2023-01-23 MED ORDER — TIRZEPATIDE-WEIGHT MANAGEMENT 2.5 MG/0.5ML ~~LOC~~ SOLN: 2.5000 mg | SUBCUTANEOUS | 0 refills | Status: DC

## 2023-01-25 ENCOUNTER — Encounter: Payer: Self-pay | Admitting: Physician Assistant

## 2023-01-25 DIAGNOSIS — M7062 Trochanteric bursitis, left hip: Secondary | ICD-10-CM

## 2023-01-25 MED ORDER — OXYCODONE-ACETAMINOPHEN 5-325 MG PO TABS: 1.0000 | ORAL_TABLET | Freq: Three times a day (TID) | ORAL | 0 refills | Status: DC | PRN

## 2023-01-25 NOTE — Telephone Encounter (Signed)
Patient informed and [facility] [place] is o.k. for today's rx   Changed preferred pharmacy to  [facility] in chart for future med refills.

## 2023-01-25 NOTE — Telephone Encounter (Signed)
..  PDMP reviewed during this encounter. No refills of oxycodone since 10/19

## 2023-01-28 NOTE — Addendum Note (Signed)
Addended by: Chalmers Cater on: 01/28/2023 11:15 AM   Modules accepted: Orders

## 2023-01-28 NOTE — Telephone Encounter (Signed)
Pt picked up in [place].  ..PDMP reviewed during this encounter.

## 2023-01-29 ENCOUNTER — Encounter: Payer: Self-pay | Admitting: Physician Assistant

## 2023-01-29 ENCOUNTER — Ambulatory Visit: Payer: No Typology Code available for payment source | Admitting: Physician Assistant

## 2023-01-29 VITALS — BP 122/78 | HR 71 | Ht 66.0 in | Wt 153.0 lb

## 2023-01-29 DIAGNOSIS — R197 Diarrhea, unspecified: Secondary | ICD-10-CM | POA: Diagnosis not present

## 2023-01-29 DIAGNOSIS — E876 Hypokalemia: Secondary | ICD-10-CM

## 2023-01-29 DIAGNOSIS — R10817 Generalized abdominal tenderness: Secondary | ICD-10-CM | POA: Diagnosis not present

## 2023-01-29 DIAGNOSIS — R112 Nausea with vomiting, unspecified: Secondary | ICD-10-CM | POA: Diagnosis not present

## 2023-01-29 DIAGNOSIS — R1013 Epigastric pain: Secondary | ICD-10-CM | POA: Insufficient documentation

## 2023-01-29 DIAGNOSIS — Z8639 Personal history of other endocrine, nutritional and metabolic disease: Secondary | ICD-10-CM

## 2023-01-29 DIAGNOSIS — R6889 Other general symptoms and signs: Secondary | ICD-10-CM

## 2023-01-29 LAB — POCT INFLUENZA A/B
Influenza A, POC: NEGATIVE
Influenza B, POC: NEGATIVE

## 2023-01-29 LAB — POC COVID19 BINAXNOW: SARS Coronavirus 2 Ag: NEGATIVE

## 2023-01-29 MED ORDER — ONDANSETRON 8 MG PO TBDP
8.0000 mg | ORAL_TABLET | Freq: Three times a day (TID) | ORAL | 1 refills | Status: AC | PRN
Start: 2023-01-29 — End: ?

## 2023-01-29 MED ORDER — PROMETHAZINE HCL 12.5 MG PO TABS: 12.5000 mg | ORAL_TABLET | Freq: Three times a day (TID) | ORAL | 0 refills | Status: AC | PRN

## 2023-01-29 NOTE — Patient Instructions (Signed)
Norovirus Infection Norovirus infection causes inflammation in the stomach and intestines (gastroenteritis) and food poisoning. It is caused by exposure to a virus from a group of similar viruses called noroviruses. Norovirus spreads very easily from person to person (is very contagious). It often occurs in places where people are in close contact, such as schools, nursing homes, restaurants, and cruise ships. You can get it from food, water, surfaces, or other people who have the virus. Norovirus is also found in the stool (feces) or vomit of infected people. You can spread the infection as soon as you feel sick, and you may continue to be contagious after you recover. What are the causes? This condition is caused by contact with norovirus. You can catch norovirus if you: Eat or drink something that is contaminated with norovirus. Touch surfaces or objects that are contaminated with norovirus and then put your hand in or by your mouth or nose. Have direct contact with an infected person who may or may not still have symptoms. Share food, drink, or utensils with someone who is contagious with norovirus. What are the signs or symptoms? Symptoms usually begin within 12 hours to 2 days after you become infected. Most norovirus symptoms affect the digestive system.Symptoms may include: Nausea, vomiting, and diarrhea. Stomach cramps. Fever. Chills. Headache. Muscle aches and tiredness. How is this diagnosed? This condition may be diagnosed based on: Your symptoms. A physical exam. A stool test. How is this treated? There is no specific treatment for norovirus. Most people get better without treatment in about 2 days. Young children, the elderly, and people who are already sick may take up to 6 days to recover. Follow these instructions at home:  Eating and drinking  Drink plenty of water to replace fluids that are lost through diarrhea and vomiting. This prevents dehydration. Drink enough  fluid to keep your urine pale yellow. Drink clear fluids in small amounts as you are able. Clear fluids include water, ice chips, fruit juice with water added (diluted fruit juice), and low-calorie sports drinks. Avoid fluids that contain a lot of sugar or caffeine, such as energy drinks, sports drinks, and soda. Avoid alcohol. If instructed by your health care provider, drink an oral rehydration solution (ORS). This is a drink that is sold at pharmacies and retail stores. An ORS contains minerals (electrolytes) that you can lose through diarrhea and vomiting. Eat bland, easy-to-digest foods in small amounts as you are able. These foods include rice, lean meats, toast, and crackers. Avoid spicy or fatty foods. General instructions Rest at home while you recover. Do not prepare food for others while you are infected. Wait at least 3 days after you recover from the illness to do this. Take over-the-counter and prescription medicines only as told by your health care provider. Wash your hands frequently with soap and water for at least 20 seconds. Alcohol-based hand sanitizer can be used in addition to soap and water, but sanitizer should not be the only cleansing method because it is not effective at removing norovirus from your hands or surfaces. Make sure that each person in your household washes his or her hands well and often. Keep all follow-up visits. This is important. How is this prevented? To help prevent the spread of norovirus: Stay at home if you are feeling sick. This will reduce the risk of spreading the virus to others. Wash your hands often with soap and water for at least 20 seconds, especially after using the toilet, helping a child use  the toilet, or changing a child's diaper. Wash fruits and vegetables thoroughly before peeling, preparing, or serving them. Throw out any food that a sick person may have touched. Disinfect contaminated surfaces immediately after someone in the  household has been sick. Disinfect frequently used surfaces, such as counters, doorknobs, and faucets. Use a bleach-based household cleaner. Immediately remove and wash soiled clothes or sheets. Contact a health care provider if: You have vomiting, diarrhea, or stomach pain that gets worse. You have symptoms that do not go away after 3-6 days. You have a fever. You cannot drink without vomiting. You feel light-headed or dizzy. Your symptoms get worse. Get help right away if: You develop symptoms of dehydration that do not improve with fluid replacement, such as: Excessive sleepiness. Lack of tears. Very little urine production. Dry mouth. Muscle cramps. Weak pulse. Confusion. Summary Norovirus infection is common and often occurs in places where people are in close contact, such as schools, nursing homes, restaurants, and cruise ships. To help prevent the spread of this infection, wash hands with soap and water for at least 20 seconds before handling food or after having contact with stool or body fluids. There is no specific treatment for norovirus, but most people get better without treatment in about 2 days. People who are healthy when infected often recover sooner than those who are elderly, young, or already sick. Replace lost fluids by drinking plenty of water, or by drinking oral rehydration solution (ORS), which contains important minerals called electrolytes. This prevents dehydration. This information is not intended to replace advice given to you by your health care provider. Make sure you discuss any questions you have with your health care provider. Document Revised: 07/27/2020 Document Reviewed: 07/27/2020 Elsevier Patient Education  2024 [employer].

## 2023-01-29 NOTE — Progress Notes (Signed)
Negative for covid/flu.

## 2023-01-29 NOTE — Progress Notes (Unsigned)
Established Patient Office Visit  Subjective   Patient ID: Alicia Sanders, female    DOB: [birth year]  Age: 43 y.o. MRN: [id number]  Chief Complaint  Patient presents with   Medical Management of Chronic Issues    Wgt management and nausea and vomiting onset 1 week , progressively got worse over weekend    HPI  Pt is a 43 yo female presenting to the clinic today for a with N/V/D, chills and abd cramping for 1 day. Pt says that she was last at work on Friday and many of her coworkers have called out sick with norovirus. She is also experiencing epigastric pain and tenderness because of the vomiting. She states that she has had about 9 episodes of vomiting and 3 episodes of diarrhea within the last day. She has only been able to keep down some chicken broth and minimal water. Pt states that she took one phenergan last night and that helped with the N/V and allowed her to sleep for a few hours.   She is also currently on Zepbound and has lost over 40lbs since starting in September. Her last dose was 5 mg last Tuesday, she was due for another dose at 2.5 mg today but has not taken it d/t her symptoms. She denies melena, fever, HA, CP, SOB, palpitations, or dizziness.   Review of Systems  Constitutional:  Positive for chills and malaise/fatigue.  Gastrointestinal:  Positive for abdominal pain, diarrhea, nausea and vomiting.  All other systems reviewed and are negative.  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   Dysphagia 01/27/2016   Mood disorder (HCC) 01/27/2016   DDD (degenerative disc disease), cervical 01/27/2016   Overweight (BMI 25.0-29.9) 06/25/2016   Other viral warts 06/26/2016   Left lower quadrant abdominal pain 01/28/2017   Mesenteric adenitis  07/24/2017   Nausea 09/23/2017   Hirsutism 03/05/2018   Thyroid disease 01/07/2019   Appendicitis, acute 01/08/2019   Snoring 06/29/2019   Vitamin D deficiency 07/02/2019   Gastroenteritis 02/06/2021   Elevated blood pressure reading 05/23/2021   Intractable migraine without aura and with status migrainosus 05/23/2021   Stress at work 05/23/2021   Primary hypertension 10/04/2021   COVID-19 12/27/2021   Family history of heart disease 01/26/2022   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   Orthostatic hypotension 01/26/2022   Chest pain of uncertain etiology 02/22/2022   Pre-procedure lab exam 02/22/2022   Rash 05/11/2022   Bilateral leg pain 05/11/2022   Vasculitis (HCC) 05/11/2022   Class 1 obesity due to excess calories without serious comorbidity with body mass index (BMI) of 30.0 to 30.9 in adult 06/27/2022   Leg edema 06/27/2022   Epigastric pain 01/29/2023   Nausea and vomiting 01/29/2023   Diarrhea 01/29/2023   Resolved Ambulatory Problems    Diagnosis Date Noted   Dermatophytosis of nail 09/04/2007   Anxiety state 10/22/2006   OTITIS MEDIA, SEROUS, ACUTE, RIGHT 12/03/2009   HOT FLASHES 10/16/2006   DERMATITIS 09/07/2009   SKIN RASH 09/04/2007   Enlarged lymph nodes 10/16/2006   Infective otitis externa 07/11/2010   LOW BACK PAIN, ACUTE 08/03/2010   BACK STRAIN, THORACIC 08/03/2010   Acute maxillary sinusitis 05/08/2012   Preventive measure 05/08/2012   Dysfunction of right eustachian tube 06/03/2012   Plantar fasciitis, right 04/18/2015  Weight gain 04/18/2015   Acute maxillary sinusitis 10/26/2015   Pleuritic chest pain 01/27/2016   Acute cystitis 10/11/2016   Cough 01/23/2017   Abnormal CT of the abdomen 09/23/2017   Change in bowel habits 09/23/2017   Bloating 03/05/2018   Bilateral lower abdominal discomfort 03/05/2018   No energy 03/05/2018   Acute appendicitis 01/08/2019   Past Medical History:  Diagnosis Date   Back problem    Eczema        Objective:     BP 122/78   Pulse 71   Ht 5\' 6"  (1.676 m)   Wt 69.4 kg   SpO2 99%   BMI 24.69 kg/m   BP Readings from Last 3 Encounters:  01/29/23 122/78  09/17/22 118/82  06/27/22 120/80   Wt Readings from Last 3 Encounters:  01/29/23 69.4 kg  09/17/22 89 kg  06/27/22 85.3 kg     Physical Exam Constitutional:      Appearance: She is ill-appearing.  HENT:     Head: Normocephalic.  Cardiovascular:     Rate and Rhythm: Normal rate and regular rhythm.     Heart sounds: Normal heart sounds.  Pulmonary:     Effort: Pulmonary effort is normal.     Breath sounds: Normal breath sounds.  Abdominal:     Tenderness: There is abdominal tenderness.     Comments: Hyperactive bowel sounds  Neurological:     General: No focal deficit present.     Mental Status: She is alert and oriented to person, place, and time.  Psychiatric:        Mood and Affect: Mood normal.    No results found for any visits on 01/29/23.  {Labs (Optional):23779}  The 10-year ASCVD risk score (Arnett DK, et al., 2019) is: 0.8%    Assessment & Plan:  Alicia Sanders was seen today for medical management of chronic issues.  Diagnoses and all orders for this visit:  Epigastric pain -     CBC w/Diff/Platelet -     CMP14+EGFR -     Lipase  History of obesity  Nausea and vomiting, unspecified vomiting type -     CBC w/Diff/Platelet -     CMP14+EGFR -     Lipase -     ondansetron (ZOFRAN-ODT) 8 MG disintegrating tablet; Take 1 tablet (8 mg total) by mouth every 8 (eight) hours as needed for nausea.  Diarrhea, unspecified type -     CBC w/Diff/Platelet -     CMP14+EGFR -     Lipase  Other orders -     promethazine (PHENERGAN) 12.5 MG tablet; Take 1 tablet (12.5 mg total) by mouth every 8 (eight) hours as needed for nausea or vomiting.   Lipase/ CMP/ CBC ordered COVID-19/ Flu negative Work note written. Out of work until Friday  Zofran and Phenergan sent to the pharmacy; use as needed for  nausea and vomiting. Best to use phenergan at night due to possible drowsiness  Delay next dose of Semaglutide until symptoms resolve  Eat small, bland meals and try to stay hydrated  Follow up if symptoms worsen or do not improve   Windy Fast [place], Student-PA

## 2023-01-30 ENCOUNTER — Encounter: Payer: Self-pay | Admitting: Physician Assistant

## 2023-01-30 ENCOUNTER — Other Ambulatory Visit: Payer: Self-pay | Admitting: Physician Assistant

## 2023-01-30 DIAGNOSIS — R10827 Generalized rebound abdominal tenderness: Secondary | ICD-10-CM | POA: Insufficient documentation

## 2023-01-30 LAB — CMP14+EGFR
ALT: 11 [IU]/L (ref 0–32)
AST: 12 [IU]/L (ref 0–40)
Albumin: 4.6 g/dL (ref 3.9–4.9)
Alkaline Phosphatase: 85 [IU]/L (ref 44–121)
BUN/Creatinine Ratio: 8 — ABNORMAL LOW (ref 9–23)
BUN: 6 mg/dL (ref 6–24)
Bilirubin Total: 0.4 mg/dL (ref 0.0–1.2)
CO2: 22 mmol/L (ref 20–29)
Calcium: 9.5 mg/dL (ref 8.7–10.2)
Chloride: 100 mmol/L (ref 96–106)
Creatinine, Ser: 0.79 mg/dL (ref 0.57–1.00)
Globulin, Total: 2.6 g/dL (ref 1.5–4.5)
Glucose: 99 mg/dL (ref 70–99)
Potassium: 3 mmol/L — ABNORMAL LOW (ref 3.5–5.2)
Sodium: 141 mmol/L (ref 134–144)
Total Protein: 7.2 g/dL (ref 6.0–8.5)
eGFR: 96 mL/min/{1.73_m2} (ref >59)

## 2023-01-30 LAB — CBC WITH DIFFERENTIAL/PLATELET
Basophils Absolute: 0 10*3/uL (ref 0.0–0.2)
Basos: 0 %
EOS (ABSOLUTE): 0 10*3/uL (ref 0.0–0.4)
Eos: 0 %
Hematocrit: 43.7 % (ref 34.0–46.6)
Hemoglobin: 15.2 g/dL (ref 11.1–15.9)
Immature Grans (Abs): 0 10*3/uL (ref 0.0–0.1)
Immature Granulocytes: 0 %
Lymphocytes Absolute: 3.9 10*3/uL — ABNORMAL HIGH (ref 0.7–3.1)
Lymphs: 33 %
MCH: 30.6 pg (ref 26.6–33.0)
MCHC: 34.8 g/dL (ref 31.5–35.7)
MCV: 88 fL (ref 79–97)
Monocytes Absolute: 0.5 10*3/uL (ref 0.1–0.9)
Monocytes: 5 %
Neutrophils Absolute: 7.4 10*3/uL — ABNORMAL HIGH (ref 1.4–7.0)
Neutrophils: 62 %
Platelets: 350 10*3/uL (ref 150–450)
RBC: 4.97 x10E6/uL (ref 3.77–5.28)
RDW: 13.4 % (ref 11.7–15.4)
WBC: 12 10*3/uL — ABNORMAL HIGH (ref 3.4–10.8)

## 2023-01-30 LAB — LIPASE: Lipase: 61 U/L (ref 14–72)

## 2023-01-30 MED ORDER — POTASSIUM CHLORIDE CRYS ER 10 MEQ PO TBCR: EXTENDED_RELEASE_TABLET | ORAL | 0 refills | Status: AC

## 2023-01-30 NOTE — Progress Notes (Signed)
Alicia Sanders,   WBC elevated indicating likely norovirus or some type of infection.  Potassium is low. I will send over some for the next 2 days. Recheck potassium on Friday.  Lipase still in normal range.

## 2023-02-01 ENCOUNTER — Encounter: Payer: Self-pay | Admitting: Physician Assistant

## 2023-02-02 LAB — POTASSIUM: Potassium: 3.6 mmol/L (ref 3.5–5.2)

## 2023-02-04 ENCOUNTER — Encounter: Payer: Self-pay | Admitting: Physician Assistant

## 2023-02-04 NOTE — Progress Notes (Signed)
 Much better

## 2023-02-09 ENCOUNTER — Other Ambulatory Visit: Payer: Self-pay | Admitting: Physician Assistant

## 2023-02-09 DIAGNOSIS — F39 Unspecified mood [affective] disorder: Secondary | ICD-10-CM

## 2023-02-13 ENCOUNTER — Other Ambulatory Visit: Payer: Self-pay | Admitting: Physician Assistant

## 2023-02-14 MED ORDER — LEVOTHYROXINE SODIUM 50 MCG PO TABS: ORAL_TABLET | ORAL | 0 refills | Status: DC

## 2023-02-28 ENCOUNTER — Encounter: Payer: Self-pay | Admitting: Physician Assistant

## 2023-02-28 DIAGNOSIS — B001 Herpesviral vesicular dermatitis: Secondary | ICD-10-CM

## 2023-02-28 MED ORDER — VALACYCLOVIR HCL 1 G PO TABS
ORAL_TABLET | ORAL | 0 refills | Status: AC
Start: 2023-02-28 — End: ?

## 2023-03-09 ENCOUNTER — Other Ambulatory Visit: Payer: Self-pay | Admitting: Physician Assistant

## 2023-03-09 DIAGNOSIS — I10 Essential (primary) hypertension: Secondary | ICD-10-CM

## 2023-03-10 ENCOUNTER — Other Ambulatory Visit: Payer: Self-pay | Admitting: Cardiology

## 2023-03-22 ENCOUNTER — Encounter: Payer: Self-pay | Admitting: Physician Assistant

## 2023-03-25 ENCOUNTER — Other Ambulatory Visit: Payer: Self-pay | Admitting: Physician Assistant

## 2023-04-06 ENCOUNTER — Other Ambulatory Visit: Payer: Self-pay | Admitting: Cardiology

## 2023-05-16 ENCOUNTER — Other Ambulatory Visit: Payer: Self-pay | Admitting: Physician Assistant

## 2023-05-22 ENCOUNTER — Telehealth: Payer: Self-pay | Admitting: Physician Assistant

## 2023-05-22 NOTE — Telephone Encounter (Signed)
 Copied from CRM [id number]. Topic: Clinical - Prescription Issue >> May 22, 2023  9:33 AM Madelyne Schiff wrote: Alicia Sanders with [facility]  Is calling to get clarification on a script   P: [phone] F: [phone]  Medication:  Lipo Slim  Dosage?  (Unless I overlooked in RX list.  I had to reference Patients Chart:  03/22/23 patient message.)

## 2023-06-02 ENCOUNTER — Other Ambulatory Visit: Payer: Self-pay | Admitting: Physician Assistant

## 2023-06-02 DIAGNOSIS — I10 Essential (primary) hypertension: Secondary | ICD-10-CM

## 2023-06-10 ENCOUNTER — Other Ambulatory Visit: Payer: Self-pay | Admitting: Physician Assistant

## 2023-06-14 ENCOUNTER — Other Ambulatory Visit: Payer: Self-pay | Admitting: Physician Assistant

## 2023-06-17 ENCOUNTER — Other Ambulatory Visit (HOSPITAL_COMMUNITY): Payer: Self-pay

## 2023-06-17 ENCOUNTER — Encounter: Payer: Self-pay | Admitting: Physician Assistant

## 2023-06-17 DIAGNOSIS — M5136 Other intervertebral disc degeneration, lumbar region with discogenic back pain only: Secondary | ICD-10-CM

## 2023-06-17 DIAGNOSIS — M7062 Trochanteric bursitis, left hip: Secondary | ICD-10-CM

## 2023-06-17 MED ORDER — VALACYCLOVIR HCL 1 G PO TABS
2000.0000 mg | ORAL_TABLET | Freq: Two times a day (BID) | ORAL | 0 refills | Status: DC
  Filled 2023-06-17: qty 30, 8d supply, fill #0

## 2023-06-17 MED ORDER — OXYCODONE-ACETAMINOPHEN 5-325 MG PO TABS
1.0000 | ORAL_TABLET | Freq: Three times a day (TID) | ORAL | 0 refills | Status: DC | PRN
  Filled 2023-06-17: qty 10, 4d supply, fill #0

## 2023-06-17 NOTE — Telephone Encounter (Signed)
..  PDMP reviewed during this encounter. Last fill 01/2023 Acute on chronic pain after exertional injury Follow up if not improving in next few days or worsening

## 2023-06-17 NOTE — Telephone Encounter (Signed)
 Requesting rx rf of oxycodone - acet Last written 01/25/2023 Last OV 01/29/2023 Upcoming appt 06/18/2023

## 2023-06-18 ENCOUNTER — Ambulatory Visit: Payer: PRIVATE HEALTH INSURANCE | Admitting: Physician Assistant

## 2023-06-24 ENCOUNTER — Ambulatory Visit: Payer: PRIVATE HEALTH INSURANCE | Admitting: Physician Assistant

## 2023-06-25 ENCOUNTER — Telehealth (INDEPENDENT_AMBULATORY_CARE_PROVIDER_SITE_OTHER): Admitting: Physician Assistant

## 2023-06-25 ENCOUNTER — Encounter: Payer: Self-pay | Admitting: Physician Assistant

## 2023-06-25 VITALS — BP 126/76 | HR 72 | Wt 136.0 lb

## 2023-06-25 DIAGNOSIS — Z8639 Personal history of other endocrine, nutritional and metabolic disease: Secondary | ICD-10-CM | POA: Insufficient documentation

## 2023-06-25 DIAGNOSIS — Z131 Encounter for screening for diabetes mellitus: Secondary | ICD-10-CM

## 2023-06-25 DIAGNOSIS — E559 Vitamin D deficiency, unspecified: Secondary | ICD-10-CM

## 2023-06-25 DIAGNOSIS — M545 Low back pain, unspecified: Secondary | ICD-10-CM | POA: Insufficient documentation

## 2023-06-25 DIAGNOSIS — E039 Hypothyroidism, unspecified: Secondary | ICD-10-CM | POA: Diagnosis not present

## 2023-06-25 DIAGNOSIS — M5136 Other intervertebral disc degeneration, lumbar region with discogenic back pain only: Secondary | ICD-10-CM

## 2023-06-25 DIAGNOSIS — Z13220 Encounter for screening for lipoid disorders: Secondary | ICD-10-CM

## 2023-06-25 DIAGNOSIS — G8929 Other chronic pain: Secondary | ICD-10-CM

## 2023-06-25 MED ORDER — TIZANIDINE HCL 4 MG PO TABS: 4.0000 mg | ORAL_TABLET | Freq: Four times a day (QID) | ORAL | 0 refills | Status: AC | PRN

## 2023-06-25 MED ORDER — IBUPROFEN 800 MG PO TABS: 800.0000 mg | ORAL_TABLET | Freq: Three times a day (TID) | ORAL | 0 refills | Status: DC | PRN

## 2023-06-25 NOTE — Progress Notes (Signed)
 ..Virtual Visit via Video Note  I connected with Alicia Sanders on 06/25/23 at 11:30 AM EDT by a video enabled telemedicine application and verified that I am speaking with the correct person using two identifiers.  Location: Patient: work Provider: clinic  .SABRAParticipating in visit:  Patient: Alicia Sanders Provider: Vermell Bologna PA-C   I discussed the limitations of evaluation and management by telemedicine and the availability of in person appointments. The patient expressed understanding and agreed to proceed.  History of Present Illness: Pt is a 11 female with chronic back pain due to lumbar DDD, hypothyroidism, hx of obesity who calls in for medication refills.   Pt has been on compounded liposlim and lost 58lbs since sept 2024. She has hx of obesity and trial of extensive diet and exercise with no benefit. She is doing great and feels great on liposlim. She denies any side effects. She is at her goal weight. She is taking 50 units of liposlim weekly. She is active.   She needs labs to get thyroid  medication refilled.   Pt does have chronic low back pain. She has been more active recently [place] She had to call in for oxycodone  refill. She only uses for breakthrough pain. She is very sore today. She has not had massage recently. She does not have NSAIdS or muscle relaxer right now. She also uses tramadol  for break through pain when not as severe.    .. 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   Dysphagia 01/27/2016   Mood disorder (HCC) 01/27/2016   DDD (degenerative disc disease), cervical 01/27/2016   Overweight (BMI 25.0-29.9) 06/25/2016   Other viral warts 06/26/2016   Left lower quadrant abdominal pain 01/28/2017    Mesenteric adenitis 07/24/2017   Nausea 09/23/2017   Hirsutism 03/05/2018   Thyroid  disease 01/07/2019   Appendicitis, acute 01/08/2019   Snoring 06/29/2019   Vitamin D  deficiency 07/02/2019   Gastroenteritis 02/06/2021   Elevated blood pressure reading 05/23/2021   Intractable migraine without aura and with status migrainosus 05/23/2021   Stress at work 05/23/2021   Primary hypertension 10/04/2021   COVID-19 12/27/2021   Family history of heart disease 01/26/2022   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   Orthostatic hypotension 01/26/2022   Chest pain of uncertain etiology 02/22/2022   Pre-procedure lab exam 02/22/2022   Rash 05/11/2022   Bilateral leg pain 05/11/2022   Vasculitis (HCC) 05/11/2022   Class 1 obesity due to excess calories without serious comorbidity with body mass index (BMI) of 30.0 to 30.9 in adult 06/27/2022   Leg edema 06/27/2022   Epigastric pain 01/29/2023   Nausea and vomiting 01/29/2023   Diarrhea 01/29/2023   Generalized abdominal tenderness with rebound tenderness 01/30/2023   Chronic bilateral low back pain without sciatica 06/25/2023   History of obesity 06/25/2023   Resolved Ambulatory Problems    Diagnosis Date Noted   Dermatophytosis of nail 09/04/2007   Anxiety state 10/22/2006   OTITIS MEDIA, SEROUS, ACUTE, RIGHT 12/03/2009   HOT FLASHES 10/16/2006   DERMATITIS 09/07/2009   SKIN RASH 09/04/2007   Enlarged lymph nodes 10/16/2006   Infective otitis externa 07/11/2010   LOW BACK PAIN, ACUTE 08/03/2010   BACK STRAIN, THORACIC 08/03/2010   Acute maxillary sinusitis 05/08/2012  Preventive measure 05/08/2012   Dysfunction of right eustachian tube 06/03/2012   Plantar fasciitis, right 04/18/2015   Weight gain 04/18/2015   Acute maxillary sinusitis 10/26/2015   Pleuritic chest pain 01/27/2016   Acute cystitis 10/11/2016   Cough 01/23/2017   Abnormal CT of the abdomen 09/23/2017   Change in bowel habits 09/23/2017   Bloating 03/05/2018    Bilateral lower abdominal discomfort 03/05/2018   No energy 03/05/2018   Acute appendicitis 01/08/2019   Past Medical History:  Diagnosis Date   Back problem    Eczema        Observations/Objective: No acute distress Normal mood and appearance  .SABRA Today's Vitals   06/25/23 1121  BP: 126/76  Pulse: 72  Weight: 136 lb (61.7 kg)   Body mass index is 21.95 kg/m.    Assessment and Plan: SABRASABRADiagnoses and all orders for this visit:  Degeneration of intervertebral disc of lumbar region with discogenic back pain -     tiZANidine (ZANAFLEX) 4 MG tablet; Take 1 tablet (4 mg total) by mouth every 6 (six) hours as needed for muscle spasms. -     ibuprofen (ADVIL) 800 MG tablet; Take 1 tablet (800 mg total) by mouth every 8 (eight) hours as needed.  Hypothyroidism, unspecified type -     TSH + free T4 -     CBC w/Diff/Platelet  Screening for diabetes mellitus -     CMP14+EGFR  Screening for lipid disorders -     Lipid panel  Vitamin D  deficiency -     VITAMIN D  25 Hydroxy (Vit-D Deficiency, Fractures)  History of obesity  Chronic bilateral low back pain without sciatica -     tiZANidine (ZANAFLEX) 4 MG tablet; Take 1 tablet (4 mg total) by mouth every 6 (six) hours as needed for muscle spasms. -     ibuprofen (ADVIL) 800 MG tablet; Take 1 tablet (800 mg total) by mouth every 8 (eight) hours as needed.   BMI looks great and to goal weight She is wanting stability now Pt has lost 58lbs since September on compounded liposlim Sent refill [facility] Continue with healthy diet and regular exercise Follow up in 6 months  Pt needs fasting labs and TSH to refill thyroid  medication  Chronic low back pain Consider massage Ibuprofen 800mg  PRN every 6-8 hours for pain Tizanidine PRN for pain and tightness Follow up as needed Breakthrough pain hx of oxycodone  or tramadol  ..PDMP reviewed during this encounter. No concerns of abuse or  dependence    Follow Up Instructions:    I discussed the assessment and treatment plan with the patient. The patient was provided an opportunity to ask questions and all were answered. The patient agreed with the plan and demonstrated an understanding of the instructions.   The patient was advised to call back or seek an in-person evaluation if the symptoms worsen or if the condition fails to improve as anticipated.    Naavya Postma, PA-C

## 2023-06-26 ENCOUNTER — Other Ambulatory Visit: Payer: Self-pay | Admitting: Physician Assistant

## 2023-07-03 NOTE — Telephone Encounter (Signed)
 Was this patient not already called to come in and get blood work done?  It looks like 15 tabs was filled on the last refill in which case this note should have gone to the front to have the patient contacted to come in.  I am not going to feel any more medicine for her.  Her thyroid  has not been checked in a year and a half.

## 2023-07-08 ENCOUNTER — Telehealth: Payer: Self-pay | Admitting: *Deleted

## 2023-07-08 MED ORDER — LEVOTHYROXINE SODIUM 50 MCG PO TABS: 50.0000 ug | ORAL_TABLET | Freq: Every day | ORAL | 0 refills | Status: DC

## 2023-07-08 NOTE — Telephone Encounter (Signed)
 Please call pt she MUST schedule a f/u appointment to have labs for her thyroid  checked and also bp. Thanks.

## 2023-07-08 NOTE — Telephone Encounter (Signed)
 Please call and cancel prescription she has to come in and have blood work.  I do not mind sending in a short supply like a week so that she has an opportunity to come and get the blood work.

## 2023-07-08 NOTE — Telephone Encounter (Signed)
 Called pharmacy and spoke w/John asked that the prescription for Levothyroxine  be backed out for #90 and changed to #15 due to patient not having UTD labs done.   He placed me on hold and advised that I resend prescription with updated information.   Updated prescription sent.  #15 no refills.

## 2023-07-09 ENCOUNTER — Encounter: Payer: Self-pay | Admitting: Physician Assistant

## 2023-07-10 NOTE — Telephone Encounter (Signed)
 See note below sent on 7/7

## 2023-07-11 NOTE — Telephone Encounter (Signed)
 I called the pharmacy and they only have the prescription for the 15 day supply not the 90 day supply. I called and left a message on patient's voicemail to come in for labs.

## 2023-07-11 NOTE — Telephone Encounter (Signed)
 Copied from CRM [id number]. Topic: Clinical - Request for Lab/Test Order >> Jul 11, 2023  2:20 PM [name] wrote: Attn: [name]   Patient stated she already did labs at another facility [facility])  and uploaded them into Mychat.  So there is no need to come in for lab work.  Please advise

## 2023-07-12 LAB — LAB REPORT - SCANNED: EGFR: 90.9

## 2023-07-14 ENCOUNTER — Encounter: Payer: Self-pay | Admitting: Physician Assistant

## 2023-07-15 ENCOUNTER — Other Ambulatory Visit (HOSPITAL_COMMUNITY): Payer: Self-pay

## 2023-07-15 MED ORDER — MUPIROCIN 2 % EX OINT
1.0000 | TOPICAL_OINTMENT | Freq: Three times a day (TID) | CUTANEOUS | 0 refills | Status: AC
  Filled 2023-07-15: qty 22, 8d supply, fill #0

## 2023-07-15 MED ORDER — CLINDAMYCIN PHOS (TWICE-DAILY) 1 % EX GEL: Freq: Two times a day (BID) | CUTANEOUS | 0 refills | Status: DC

## 2023-07-15 NOTE — Telephone Encounter (Signed)
 Can we print labs and place in box for review.

## 2023-07-19 ENCOUNTER — Encounter: Payer: Self-pay | Admitting: Physician Assistant

## 2023-07-19 MED ORDER — LEVOTHYROXINE SODIUM 50 MCG PO TABS: 50.0000 ug | ORAL_TABLET | Freq: Every day | ORAL | 3 refills | Status: AC

## 2023-07-22 NOTE — Telephone Encounter (Signed)
 I sent a mychart message to patient this morning because I don't see where she came in for labs yet. Please let me know if there is anything else I need to assist with.   Have a great day! Anjelica

## 2023-07-23 ENCOUNTER — Other Ambulatory Visit: Payer: Self-pay | Admitting: Physician Assistant

## 2023-07-23 DIAGNOSIS — M5136 Other intervertebral disc degeneration, lumbar region with discogenic back pain only: Secondary | ICD-10-CM

## 2023-07-23 DIAGNOSIS — M545 Low back pain, unspecified: Secondary | ICD-10-CM

## 2023-08-15 ENCOUNTER — Other Ambulatory Visit (HOSPITAL_COMMUNITY): Payer: Self-pay

## 2023-08-15 ENCOUNTER — Encounter: Payer: Self-pay | Admitting: Physician Assistant

## 2023-08-15 MED ORDER — ACETAMINOPHEN 500 MG PO TABS
500.0000 mg | ORAL_TABLET | ORAL | 0 refills | Status: AC
  Filled 2023-08-15: qty 24, 4d supply, fill #0

## 2023-08-15 MED ORDER — CLINDAMYCIN HCL 150 MG PO CAPS
150.0000 mg | ORAL_CAPSULE | Freq: Three times a day (TID) | ORAL | 0 refills | Status: DC
  Filled 2023-08-15: qty 21, 7d supply, fill #0

## 2023-08-16 ENCOUNTER — Other Ambulatory Visit (HOSPITAL_COMMUNITY): Payer: Self-pay

## 2023-08-16 MED ORDER — FLUCONAZOLE 150 MG PO TABS: ORAL_TABLET | ORAL | 0 refills | Status: DC

## 2023-08-16 MED ORDER — VALACYCLOVIR HCL 1 G PO TABS
2000.0000 mg | ORAL_TABLET | Freq: Two times a day (BID) | ORAL | 0 refills | Status: DC
  Filled 2023-08-16: qty 16, 4d supply, fill #0

## 2023-08-18 ENCOUNTER — Other Ambulatory Visit: Payer: Self-pay | Admitting: Physician Assistant

## 2023-08-18 DIAGNOSIS — K0889 Other specified disorders of teeth and supporting structures: Secondary | ICD-10-CM

## 2023-08-18 DIAGNOSIS — M7062 Trochanteric bursitis, left hip: Secondary | ICD-10-CM

## 2023-08-18 MED ORDER — OXYCODONE-ACETAMINOPHEN 5-325 MG PO TABS: 1.0000 | ORAL_TABLET | Freq: Three times a day (TID) | ORAL | 0 refills | Status: DC | PRN

## 2023-08-18 NOTE — Progress Notes (Signed)
 Pt calls in with tooth pain. Has root canal scheduled for September 2nd. Pain 8/10. Tramadol  and ibuprofen  not helping. Sent a few oxycodone  that she uses as needed.  SABRAPDMP reviewed during this encounter. Last fill was 06/2023

## 2023-08-28 ENCOUNTER — Other Ambulatory Visit (HOSPITAL_COMMUNITY): Payer: Self-pay

## 2023-08-28 MED ORDER — DEXAMETHASONE 0.5 MG/5ML PO ELIX
0.5000 mg | ORAL_SOLUTION | Freq: Every day | ORAL | 0 refills | Status: DC
  Filled 2023-08-28: qty 100, 4d supply, fill #0
  Filled 2023-08-28: qty 259, 10d supply, fill #0
  Filled 2023-08-28: qty 400, 16d supply, fill #0

## 2023-08-29 ENCOUNTER — Other Ambulatory Visit (HOSPITAL_COMMUNITY): Payer: Self-pay

## 2023-09-03 ENCOUNTER — Encounter: Payer: Self-pay | Admitting: Sports Medicine

## 2023-09-04 ENCOUNTER — Other Ambulatory Visit: Payer: Self-pay | Admitting: Physician Assistant

## 2023-09-04 DIAGNOSIS — I10 Essential (primary) hypertension: Secondary | ICD-10-CM

## 2023-09-08 ENCOUNTER — Other Ambulatory Visit (HOSPITAL_COMMUNITY): Payer: Self-pay

## 2023-09-09 ENCOUNTER — Other Ambulatory Visit (HOSPITAL_COMMUNITY): Payer: Self-pay

## 2023-09-13 ENCOUNTER — Encounter: Payer: Self-pay | Admitting: Cardiology

## 2023-10-02 ENCOUNTER — Other Ambulatory Visit: Payer: Self-pay | Admitting: Cardiology

## 2023-10-03 ENCOUNTER — Encounter: Payer: Self-pay | Admitting: Physician Assistant

## 2023-10-03 DIAGNOSIS — B001 Herpesviral vesicular dermatitis: Secondary | ICD-10-CM

## 2023-10-04 MED ORDER — VALACYCLOVIR HCL 1 G PO TABS: 2000.0000 mg | ORAL_TABLET | Freq: Two times a day (BID) | ORAL | 2 refills | Status: DC

## 2023-11-06 ENCOUNTER — Other Ambulatory Visit: Payer: Self-pay | Admitting: Physician Assistant

## 2023-11-06 ENCOUNTER — Ambulatory Visit
Admission: RE | Admit: 2023-11-06 | Discharge: 2023-11-06 | Disposition: A | Discharge status: Home / Self Care | Source: Ambulatory Visit | Attending: Physician Assistant | Admitting: Physician Assistant

## 2023-11-06 DIAGNOSIS — Z1231 Encounter for screening mammogram for malignant neoplasm of breast: Secondary | ICD-10-CM

## 2023-11-11 ENCOUNTER — Other Ambulatory Visit: Payer: Self-pay | Admitting: Physician Assistant

## 2023-11-11 ENCOUNTER — Ambulatory Visit: Payer: Self-pay | Admitting: Physician Assistant

## 2023-11-11 DIAGNOSIS — R928 Other abnormal and inconclusive findings on diagnostic imaging of breast: Secondary | ICD-10-CM

## 2023-11-11 NOTE — Progress Notes (Signed)
 Alicia Sanders,   Asymmetry of left breast. More imaging needed. They will be reaching out to schedule.

## 2023-11-20 ENCOUNTER — Other Ambulatory Visit (HOSPITAL_COMMUNITY): Payer: Self-pay

## 2023-11-20 MED ORDER — NITROFURANTOIN MONOHYD MACRO 100 MG PO CAPS
100.0000 mg | ORAL_CAPSULE | Freq: Two times a day (BID) | ORAL | 0 refills | Status: AC
  Filled 2023-11-20: qty 14, 7d supply, fill #0

## 2023-11-23 ENCOUNTER — Other Ambulatory Visit: Payer: Self-pay | Admitting: Physician Assistant

## 2023-11-23 DIAGNOSIS — I10 Essential (primary) hypertension: Secondary | ICD-10-CM

## 2023-11-25 ENCOUNTER — Ambulatory Visit
Admission: RE | Admit: 2023-11-25 | Discharge: 2023-11-25 | Disposition: A | Discharge status: Home / Self Care | Source: Ambulatory Visit | Attending: Physician Assistant | Admitting: Physician Assistant

## 2023-11-25 DIAGNOSIS — R928 Other abnormal and inconclusive findings on diagnostic imaging of breast: Secondary | ICD-10-CM

## 2023-12-23 ENCOUNTER — Encounter: Payer: Self-pay | Admitting: Physician Assistant

## 2023-12-23 ENCOUNTER — Ambulatory Visit: Admitting: Physician Assistant

## 2023-12-23 VITALS — BP 109/70 | HR 70 | Ht 65.0 in | Wt 124.0 lb

## 2023-12-23 DIAGNOSIS — Z8639 Personal history of other endocrine, nutritional and metabolic disease: Secondary | ICD-10-CM | POA: Diagnosis not present

## 2023-12-23 DIAGNOSIS — M5030 Other cervical disc degeneration, unspecified cervical region: Secondary | ICD-10-CM | POA: Diagnosis not present

## 2023-12-23 DIAGNOSIS — I8393 Asymptomatic varicose veins of bilateral lower extremities: Secondary | ICD-10-CM | POA: Diagnosis not present

## 2023-12-23 DIAGNOSIS — M2550 Pain in unspecified joint: Secondary | ICD-10-CM | POA: Diagnosis not present

## 2023-12-23 DIAGNOSIS — Z7689 Persons encountering health services in other specified circumstances: Secondary | ICD-10-CM | POA: Diagnosis not present

## 2023-12-23 DIAGNOSIS — L301 Dyshidrosis [pompholyx]: Secondary | ICD-10-CM

## 2023-12-23 DIAGNOSIS — Z713 Dietary counseling and surveillance: Secondary | ICD-10-CM

## 2023-12-23 DIAGNOSIS — Z131 Encounter for screening for diabetes mellitus: Secondary | ICD-10-CM | POA: Diagnosis not present

## 2023-12-23 DIAGNOSIS — M5136 Other intervertebral disc degeneration, lumbar region with discogenic back pain only: Secondary | ICD-10-CM

## 2023-12-23 DIAGNOSIS — I7300 Raynaud's syndrome without gangrene: Secondary | ICD-10-CM | POA: Diagnosis not present

## 2023-12-23 DIAGNOSIS — Z13220 Encounter for screening for lipoid disorders: Secondary | ICD-10-CM

## 2023-12-23 MED ORDER — TRAMADOL HCL 50 MG PO TABS: 50.0000 mg | ORAL_TABLET | Freq: Three times a day (TID) | ORAL | 0 refills | Status: AC | PRN

## 2023-12-23 NOTE — Progress Notes (Unsigned)
" ° °  Established Patient Office Visit  Subjective   Patient ID: Alicia Sanders, female    DOB: March 18, 1980  Age: 43 y.o. MRN: [id number]  No chief complaint on file.   HPI  {History (Optional):23778}  ROS    Objective:     There were no vitals taken for this visit. {Vitals History (Optional):23777}  Physical Exam   No results found for any visits on 12/23/23.  {Labs (Optional):23779}  The 10-year ASCVD risk score (Arnett DK, et al., 2019) is: 0.9%    Assessment & Plan:   Problem List Items Addressed This Visit   None   No follow-ups on file.    Lola Lofaro, PA-C  "

## 2023-12-23 NOTE — Patient Instructions (Signed)
 Raynaud's Phenomenon  Raynaud's phenomenon is a condition that affects the blood vessels (arteries) that carry blood to the fingers and toes. The arteries that supply blood to the ears, lips, nipples, or the tip of the nose might also be affected. Raynaud's phenomenon causes the arteries to become narrow temporarily (spasm). As a result, the flow of blood to the affected areas is temporarily decreased. This usually occurs in response to cold temperatures or stress. During an attack, the skin in the affected areas turns white, then blue, and finally red. A person may also feel tingling or numbness in those areas. Attacks usually last for only a brief period, and then the blood flow to the area returns to normal. In most cases, Raynaud's phenomenon does not cause serious health problems. What are the causes? In many cases, the cause of this condition is not known. The condition may occur on its own (primary Raynaud's phenomenon) or may be associated with other diseases or factors (secondary Raynaud's phenomenon). Possible causes may include: Diseases or medical conditions that damage the arteries. Injuries and repetitive actions that hurt the hands or feet. Being exposed to certain chemicals. Taking medicines that narrow the arteries. Other medical conditions, such as lupus, scleroderma, rheumatoid arthritis, thyroid problems, blood disorders, Sjogren syndrome, or atherosclerosis. What increases the risk? The following factors may make you more likely to develop this condition: Being 18-78 years old. Being female. Having a family history of Raynaud's phenomenon. Living in a cold climate. Smoking. What are the signs or symptoms? Symptoms of this condition usually occur when you are exposed to cold temperatures or when you have emotional stress. The symptoms may last for a few minutes or up to several hours. They usually affect your fingers but may also affect your toes, nipples, lips, ears, or the  tip of your nose. Symptoms may include: Changes in skin color. The skin in the affected areas will turn pale or white. The skin may then change from white to bluish to red as normal blood flow returns to the area. Numbness, tingling, or pain in the affected areas. In severe cases, symptoms may include: Skin sores. Tissues decaying and dying (gangrene). How is this diagnosed? This condition may be diagnosed based on: Your symptoms and medical history. A physical exam. During the exam, you may be asked to put your hands in cold water to check for a reaction to cold temperature. Tests, such as: Blood tests to check for other diseases or conditions. A test to check the movement of blood through your arteries and veins (vascular ultrasound). A test in which the skin at the base of your fingernail is examined under a microscope (nailfold capillaroscopy). How is this treated? During an episode, you can take actions to help symptoms go away faster. Options include moving your arms around in a windmill pattern, warming your fingers under warm water, or placing your fingers in a warm body fold, such as your armpit. Long-term treatment for this condition often involves making lifestyle changes and taking steps to control your exposure to cold temperature. For more severe cases, medicine (calcium channel blockers) may be used to improve blood circulation. Follow these instructions at home: Avoiding cold temperatures Take these steps to avoid exposure to cold: If possible, stay indoors during cold weather. When you go outside during cold weather, dress in layers and wear mittens, a hat, a scarf, and warm footwear. Wear mittens or gloves when handling ice or frozen food. Use holders for glasses or cans containing  cold drinks. Let warm water run for a while before taking a shower or bath. Warm up the car before driving in cold weather. Lifestyle If possible, avoid stressful and emotional situations. Try  to find ways to manage your stress, such as: Exercise. Yoga. Meditation. Biofeedback. Do not use any products that contain nicotine or tobacco. These products include cigarettes, chewing tobacco, and vaping devices, such as e-cigarettes. If you need help quitting, ask your health care provider. Avoid secondhand smoke. Limit your use of caffeine. Switch to decaffeinated coffee, tea, and soda. Avoid chocolate. Avoid vibrating tools and machinery. General instructions Protect your hands and feet from injuries, cuts, or bruises. Avoid wearing tight rings or wristbands. Wear loose fitting socks and comfortable, roomy shoes. Take over-the-counter and prescription medicines only as told by your health care provider. Where to find support Raynaud's Association: www.raynauds.org Where to find more information [employer] of Arthritis and Musculoskeletal and Skin Diseases: www.niams.http://www.myers.net/ Contact a health care provider if: Your discomfort becomes worse despite lifestyle changes. You develop sores on your fingers or toes that do not heal. You have breaks in the skin on your fingers or toes. You have a fever. You have pain or swelling in your joints. You have a rash. Your symptoms occur on only one side of your body. Get help right away if: Your fingers or toes turn black. You have severe pain in the affected areas. These symptoms may represent a serious problem that is an emergency. Do not wait to see if the symptoms will go away. Get medical help right away. Call your local emergency services (911 in the [place] Do not drive yourself to the hospital. Summary Raynaud's phenomenon is a condition that affects the arteries that carry blood to the fingers, toes, ears, lips, nipples, or the tip of the nose. In many cases, the cause of this condition is not known. Symptoms of this condition include changes in skin color along with numbness and tingling in the affected area. Treatment for  this condition includes lifestyle changes and reducing exposure to cold temperatures. Medicines may be used for severe cases of the condition. Contact your health care provider if your condition worsens despite treatment. This information is not intended to replace advice given to you by your health care provider. Make sure you discuss any questions you have with your health care provider. Document Revised: 02/23/2020 Document Reviewed: 02/23/2020 Elsevier Patient Education  2024 [employer].

## 2023-12-24 ENCOUNTER — Encounter: Payer: Self-pay | Admitting: Physician Assistant

## 2023-12-24 DIAGNOSIS — I7300 Raynaud's syndrome without gangrene: Secondary | ICD-10-CM | POA: Insufficient documentation

## 2023-12-24 DIAGNOSIS — I8393 Asymptomatic varicose veins of bilateral lower extremities: Secondary | ICD-10-CM | POA: Insufficient documentation

## 2023-12-24 DIAGNOSIS — M2550 Pain in unspecified joint: Secondary | ICD-10-CM | POA: Insufficient documentation

## 2023-12-24 MED ORDER — TRIAMCINOLONE ACETONIDE 0.1 % EX CREA: 1.0000 | TOPICAL_CREAM | Freq: Two times a day (BID) | CUTANEOUS | 0 refills | Status: AC

## 2023-12-26 LAB — CBC WITH DIFFERENTIAL/PLATELET
Basophils Absolute: 0 x10E3/uL (ref 0.0–0.2)
Basos: 1 %
EOS (ABSOLUTE): 0.1 x10E3/uL (ref 0.0–0.4)
Eos: 1 %
Hematocrit: 42.9 % (ref 34.0–46.6)
Hemoglobin: 14.3 g/dL (ref 11.1–15.9)
Immature Grans (Abs): 0 x10E3/uL (ref 0.0–0.1)
Immature Granulocytes: 0 %
Lymphocytes Absolute: 1.8 x10E3/uL (ref 0.7–3.1)
Lymphs: 26 %
MCH: 30.4 pg (ref 26.6–33.0)
MCHC: 33.3 g/dL (ref 31.5–35.7)
MCV: 91 fL (ref 79–97)
Monocytes Absolute: 0.3 x10E3/uL (ref 0.1–0.9)
Monocytes: 4 %
Neutrophils Absolute: 4.8 x10E3/uL (ref 1.4–7.0)
Neutrophils: 68 %
Platelets: 249 x10E3/uL (ref 150–450)
RBC: 4.71 x10E6/uL (ref 3.77–5.28)
RDW: 12.4 % (ref 11.7–15.4)
WBC: 6.9 x10E3/uL (ref 3.4–10.8)

## 2023-12-26 LAB — LIPID PANEL
Chol/HDL Ratio: 2.1 ratio (ref 0.0–4.4)
Cholesterol, Total: 108 mg/dL (ref 100–199)
HDL: 51 mg/dL
LDL Chol Calc (NIH): 44 mg/dL (ref 0–99)
Triglycerides: 57 mg/dL (ref 0–149)
VLDL Cholesterol Cal: 13 mg/dL (ref 5–40)

## 2023-12-26 LAB — TSH+FREE T4
Free T4: 1.39 ng/dL (ref 0.82–1.77)
TSH: 1.82 u[IU]/mL (ref 0.450–4.500)

## 2023-12-26 LAB — CMP14+EGFR
ALT: 13 IU/L (ref 0–32)
AST: 13 IU/L (ref 0–40)
Albumin: 4.5 g/dL (ref 3.9–4.9)
Alkaline Phosphatase: 78 IU/L (ref 41–116)
BUN/Creatinine Ratio: 8 — ABNORMAL LOW (ref 9–23)
BUN: 7 mg/dL (ref 6–24)
Bilirubin Total: 0.5 mg/dL (ref 0.0–1.2)
CO2: 26 mmol/L (ref 20–29)
Calcium: 9.2 mg/dL (ref 8.7–10.2)
Chloride: 102 mmol/L (ref 96–106)
Creatinine, Ser: 0.84 mg/dL (ref 0.57–1.00)
Globulin, Total: 2.4 g/dL (ref 1.5–4.5)
Glucose: 85 mg/dL (ref 70–99)
Potassium: 3.5 mmol/L (ref 3.5–5.2)
Sodium: 139 mmol/L (ref 134–144)
Total Protein: 6.9 g/dL (ref 6.0–8.5)
eGFR: 88 mL/min/1.73

## 2023-12-26 LAB — IRON,TIBC AND FERRITIN PANEL
Ferritin: 152 ng/mL — ABNORMAL HIGH (ref 15–150)
Iron Saturation: 33 % (ref 15–55)
Iron: 85 ug/dL (ref 27–159)
Total Iron Binding Capacity: 256 ug/dL (ref 250–450)
UIBC: 171 ug/dL (ref 131–425)

## 2023-12-26 LAB — ANA,IFA RA DIAG PNL W/RFLX TIT/PATN
ANA Titer 1: NEGATIVE
Cyclic Citrullin Peptide Ab: 7 U (ref 0–19)
Rheumatoid fact SerPl-aCnc: <10 [IU]/mL

## 2023-12-26 LAB — SEDIMENTATION RATE: Sed Rate: 2 mm/h (ref 0–32)

## 2023-12-26 LAB — C-REACTIVE PROTEIN: CRP: 1 mg/L (ref 0–10)

## 2023-12-26 LAB — VITAMIN D 25 HYDROXY (VIT D DEFICIENCY, FRACTURES): Vit D, 25-Hydroxy: 41.5 ng/mL (ref 30.0–100.0)

## 2023-12-31 ENCOUNTER — Ambulatory Visit: Payer: Self-pay | Admitting: Physician Assistant

## 2023-12-31 NOTE — Progress Notes (Signed)
 Alicia Sanders,   Potassium improved and in normal range. Thyroid  looks good.  Vitamin D  looks good.  ANA negative for any signs of autoimmune diseases.  Inflammatory markers are normal.  Cholesterol looks great.  Iron stores up some. Cut back on any supplemental iron.

## 2024-01-13 ENCOUNTER — Encounter: Payer: Self-pay | Admitting: Physician Assistant
